# Patient Record
Sex: Male | Born: 1945 | Race: White | Hispanic: No | Marital: Married | Smoking: Former smoker
Health system: Southern US, Community
[De-identification: ages and names within clinical notes are randomized; demographics above are authoritative.]

## PROBLEM LIST (undated history)

## (undated) DIAGNOSIS — M1990 Unspecified osteoarthritis, unspecified site: Secondary | ICD-10-CM

## (undated) DIAGNOSIS — Z87442 Personal history of urinary calculi: Secondary | ICD-10-CM

## (undated) DIAGNOSIS — E119 Type 2 diabetes mellitus without complications: Secondary | ICD-10-CM

## (undated) DIAGNOSIS — I513 Intracardiac thrombosis, not elsewhere classified: Secondary | ICD-10-CM

## (undated) DIAGNOSIS — I219 Acute myocardial infarction, unspecified: Secondary | ICD-10-CM

## (undated) DIAGNOSIS — I10 Essential (primary) hypertension: Secondary | ICD-10-CM

## (undated) DIAGNOSIS — I5022 Chronic systolic (congestive) heart failure: Secondary | ICD-10-CM

## (undated) DIAGNOSIS — I2510 Atherosclerotic heart disease of native coronary artery without angina pectoris: Secondary | ICD-10-CM

## (undated) DIAGNOSIS — Z95810 Presence of automatic (implantable) cardiac defibrillator: Secondary | ICD-10-CM

## (undated) DIAGNOSIS — R7303 Prediabetes: Secondary | ICD-10-CM

## (undated) DIAGNOSIS — E785 Hyperlipidemia, unspecified: Secondary | ICD-10-CM

## (undated) DIAGNOSIS — I255 Ischemic cardiomyopathy: Secondary | ICD-10-CM

## (undated) HISTORY — DX: Ischemic cardiomyopathy: I25.5

## (undated) HISTORY — DX: Essential (primary) hypertension: I10

## (undated) HISTORY — DX: Acute myocardial infarction, unspecified: I21.9

## (undated) HISTORY — DX: Hyperlipidemia, unspecified: E78.5

## (undated) HISTORY — DX: Intracardiac thrombosis, not elsewhere classified: I51.3

---

## 1996-06-10 DIAGNOSIS — I219 Acute myocardial infarction, unspecified: Secondary | ICD-10-CM

## 1996-06-10 HISTORY — PX: CORONARY ANGIOPLASTY: SHX604

## 1996-06-10 HISTORY — DX: Acute myocardial infarction, unspecified: I21.9

## 2002-06-10 HISTORY — PX: HERNIA REPAIR: SHX51

## 2002-07-06 ENCOUNTER — Encounter: Payer: Self-pay | Admitting: Cardiovascular Disease

## 2002-07-06 ENCOUNTER — Ambulatory Visit (HOSPITAL_COMMUNITY): Admission: RE | Admit: 2002-07-06 | Discharge: 2002-07-06 | Payer: Self-pay | Admitting: Cardiovascular Disease

## 2008-04-11 ENCOUNTER — Encounter: Payer: Self-pay | Admitting: Urology

## 2008-04-11 ENCOUNTER — Other Ambulatory Visit: Payer: Self-pay | Admitting: Urology

## 2008-04-12 ENCOUNTER — Observation Stay (HOSPITAL_COMMUNITY): Admission: AD | Admit: 2008-04-12 | Discharge: 2008-04-13 | Payer: Self-pay | Admitting: Urology

## 2010-10-23 NOTE — Op Note (Signed)
Kevin Patel, Kevin Patel                 ACCOUNT NO.:  [id number]   MEDICAL RECORD NO.:  [id number]          PATIENT TYPE:  AMB   LOCATION:  [facility]                         FACILITY:  [facility]   PHYSICIAN:  Bertram Millard. Dahlstedt, M.D.DATE OF BIRTH:  03-11-1946   DATE OF PROCEDURE:  04/11/2008  DATE OF DISCHARGE:                               OPERATIVE REPORT   PREOPERATIVE DIAGNOSIS:  Benign prostatic hypertrophy with significant  lower urinary tract symptoms, bladder calculus.   POSTOPERATIVE DIAGNOSIS:  Benign prostatic hypertrophy with significant  lower urinary tract symptoms, bladder calculus.   PROCEDURE:  Cystoscopy, holmium laser lithotripsy of bladder stone,  Gyrus transurethral resection of the prostate.   SURGEON:  Bertram Millard. Dahlstedt, M.D.   ANESTHESIA:  General with LMA.   COMPLICATIONS:  None.   BRIEF HISTORY:  This 65 year old male has significant lower urinary  tract symptoms and a bladder calculus.  He has been seen before with  recent history of dysuria and pyuria.  Additionally, he has significant  lower urinary tract symptoms.  He is on Flomax with persistent symptoms.  We have talked about further nonsurgical treatment, getting rid of the  stone at the same time, versus TURP with cystolithalopaxy.  The patient  desires to try to come off the medications.  He is aware of risks and  complications of the TURP and stone treatment - bleeding, infection,  among others.  He desires to proceed.   DESCRIPTION OF PROCEDURE:  The patient was identified in the holding  area.  He received preoperative IV antibiotics.  He was taken to the  operating room where general anesthetic was administered using LMA.  He  was placed in the dorsal lithotomy position.  Genitalia and perineum  were prepped and draped.  Time-out was then called.   A resectoscope sheath, 28-French, was passed with the obturator into the  bladder.  The bladder was drained and the resectoscope element placed  with the Gyrus cutting loop.  The bladder was inspected briefly.  Ureteral orifices were identified as well as a single large 16 mm  calculus.  It was on the right side of the trigone.  The TURP with the  Gyrus loop was then started.  First, the anterior tissue along the 12  o'clock position from the bladder neck down to the apex of the prostate  was resected.  Following this, the left lobe of the prostate was  resected down to the surgical capsule starting at the 1 o'clock  position, moving down to the 5 o'clock position.  There was very little  median lobe.  The entire right lobe was resected down to the surgical  capsule, including the apical tissue.  A similar procedure was done for  the right prostatic lobe.  Again, resection was taken down to the  surgical capsule.  Apical tissue was neatly trimmed.  Small bleeders  were carefully electrocoagulated circumferentially around the prostate.  The prostatic chips were then irrigated from the bladder.  Once all of  the chips were irrigated, inspection of the prostatic fossa revealed it  to be wide open  and hemostatic.  I then placed a different bridge  through the resectoscope, and passed a holmium laser fiber.  The stone  was fragmented into probably half a dozen pieces.  It was then  evacuated.  Remaining  inspection of the bladder showed it to be normal.  No bleeding was seen  within the prostatic fossa.  At this time a 22-French Foley catheter was  placed.  It was hooked to dependent drainage.   The procedure was terminated.  The patient was awakened and taken to  PACU in stable condition.      Bertram Millard. Dahlstedt, M.D.  Electronically Signed     SMD/MEDQ  D:  04/25/2008  T:  04/26/2008  Job:  [id number]

## 2011-02-21 ENCOUNTER — Ambulatory Visit (HOSPITAL_COMMUNITY)
Admission: RE | Admit: 2011-02-21 | Discharge: 2011-02-21 | Disposition: A | Discharge status: Home / Self Care | Payer: PRIVATE HEALTH INSURANCE | Source: Ambulatory Visit | Attending: Cardiovascular Disease | Admitting: Cardiovascular Disease

## 2011-02-21 DIAGNOSIS — R079 Chest pain, unspecified: Secondary | ICD-10-CM | POA: Insufficient documentation

## 2011-03-12 LAB — POCT I-STAT 4, (NA,K, GLUC, HGB,HCT)
Glucose, Bld: 96
HCT: 43
Hemoglobin: 14.6
Potassium: 4
Sodium: 140

## 2013-02-09 ENCOUNTER — Encounter: Payer: Self-pay | Admitting: *Deleted

## 2013-02-12 ENCOUNTER — Ambulatory Visit: Payer: PRIVATE HEALTH INSURANCE | Admitting: Cardiovascular Disease

## 2013-02-12 ENCOUNTER — Encounter: Payer: Self-pay | Admitting: Cardiovascular Disease

## 2013-02-12 ENCOUNTER — Ambulatory Visit (INDEPENDENT_AMBULATORY_CARE_PROVIDER_SITE_OTHER): Payer: Medicare Other | Admitting: Cardiovascular Disease

## 2013-02-12 VITALS — BP 120/78 | HR 53 | Ht 76.0 in | Wt 265.7 lb

## 2013-02-12 DIAGNOSIS — I255 Ischemic cardiomyopathy: Secondary | ICD-10-CM | POA: Insufficient documentation

## 2013-02-12 DIAGNOSIS — E785 Hyperlipidemia, unspecified: Secondary | ICD-10-CM

## 2013-02-12 DIAGNOSIS — I10 Essential (primary) hypertension: Secondary | ICD-10-CM | POA: Insufficient documentation

## 2013-02-12 DIAGNOSIS — I2589 Other forms of chronic ischemic heart disease: Secondary | ICD-10-CM

## 2013-02-12 MED ORDER — LISINOPRIL-HYDROCHLOROTHIAZIDE 20-12.5 MG PO TABS: 1.0000 | ORAL_TABLET | Freq: Two times a day (BID) | ORAL | Status: DC

## 2013-02-12 MED ORDER — ATORVASTATIN CALCIUM 80 MG PO TABS: 80.0000 mg | ORAL_TABLET | Freq: Every day | ORAL | Status: DC

## 2013-02-12 MED ORDER — AMLODIPINE BESYLATE 5 MG PO TABS: 5.0000 mg | ORAL_TABLET | Freq: Every day | ORAL | Status: DC

## 2013-02-12 MED ORDER — METOPROLOL TARTRATE 50 MG PO TABS: 25.0000 mg | ORAL_TABLET | Freq: Two times a day (BID) | ORAL | Status: DC

## 2013-02-12 NOTE — Assessment & Plan Note (Signed)
Status post acute myocardial infarction back in 1998 treated with PCI and stenting by Dr. Susa Griffins. His EF has been in the 35-45% range. He has had a mural thrombus the past which has been shown to have resolved by contrast echocardiography. His Coumadin at that regulation was discontinued after that. He denies chest pain or shortness of breath.

## 2013-02-12 NOTE — Assessment & Plan Note (Signed)
Well-controlled on current medications 

## 2013-02-12 NOTE — Assessment & Plan Note (Signed)
On statin therapy. His last lipid profile one year ago revealed total cholesterol 111, LDL of 54 and HDL of 45. We will recheck a lipid and liver profile.

## 2013-02-12 NOTE — Patient Instructions (Addendum)
Your physician recommends that you return for lab work in: the next week or so.  Lipid & Liver Panel You will need to be fasting for this blood work.  Your physician wants you to follow-up in: 1 year. You will receive a reminder letter in the mail two months in advance. If you don't receive a letter, please call our office to schedule the follow-up appointment.

## 2013-02-12 NOTE — Progress Notes (Signed)
02/12/2013 Kevin Patel [facility]   July 21, 1945  [id number]  Primary Physician Feliciana Rossetti, MD Primary Cardiologist: jb   HPI:  The patient is as very pleasant 67 year old, moderately overweight, married Caucasian male, father of 4 and grandfather to 8 grandchildren, whom I saw a year ago. He has a history of ischemic cardiomyopathy, status post myocardial infarction in 1998 treated with PCI stenting by Dr. Susa Griffins. His EF was in the 35% to 45% range with a question of mural thrombus in the past for which he has been on Coumadin anticoagulation. A followup echo performed in September of last year using Definity contrast showed an EF of 35% to 40% with no evidence of mural thrombus and, therefore, Coumadin was discontinued. His other problems include hypertension and hyperlipidemia. Denies chest pain or shortness of breath. His last lipid profile, performed a year ago, was excellent: Total cholesterol 112, LDL of 53, and HDL of 50.since I saw him a year ago he has been completely asymptomatic.     Current Outpatient Prescriptions  Medication Sig Dispense Refill  . amLODipine (NORVASC) 5 MG tablet Take 5 mg by mouth daily.       Marland Kitchen aspirin 81 MG tablet Take 81 mg by mouth daily.      Marland Kitchen atorvastatin (LIPITOR) 80 MG tablet Take 80 mg by mouth daily.      . Flaxseed, Linseed, (FLAXSEED OIL PO) Take by mouth. 2-3x/week      . lisinopril-hydrochlorothiazide (PRINZIDE,ZESTORETIC) 20-12.5 MG per tablet Take 1 tablet by mouth 2 (two) times daily.      . metoprolol (LOPRESSOR) 50 MG tablet Take 25 mg by mouth 2 (two) times daily. 1/2 tablet      . Multiple Vitamin (MULTIVITAMIN) capsule Take 1 capsule by mouth daily.      . vitamin C (ASCORBIC ACID) 500 MG tablet Take 500 mg by mouth daily.      . vitamin E 400 UNIT capsule Take 400 Units by mouth daily.       No current facility-administered medications for this visit.    No Known Allergies  History   Social History  . Marital Status: Married     Spouse Name: N/A    Number of Children: N/A  . Years of Education: N/A   Occupational History  . Not on file.   Social History Main Topics  . Smoking status: Former Smoker    Quit date: 02/12/1993  . Smokeless tobacco: Never Used  . Alcohol Use: No  . Drug Use: No  . Sexual Activity: Not on file   Other Topics Concern  . Not on file   Social History Narrative  . No narrative on file     Review of Systems: General: negative for chills, fever, night sweats or weight changes.  Cardiovascular: negative for chest pain, dyspnea on exertion, edema, orthopnea, palpitations, paroxysmal nocturnal dyspnea or shortness of breath Dermatological: negative for rash Respiratory: negative for cough or wheezing Urologic: negative for hematuria Abdominal: negative for nausea, vomiting, diarrhea, bright red blood per rectum, melena, or hematemesis Neurologic: negative for visual changes, syncope, or dizziness All other systems reviewed and are otherwise negative except as noted above.    Blood pressure 120/78, pulse 53, height 6\' 4"  (1.93 m), weight 265 lb 11.2 oz (120.521 kg).  General appearance: alert and no distress Neck: no adenopathy, no carotid bruit, no JVD, supple, symmetrical, trachea midline and thyroid not enlarged, symmetric, no tenderness/mass/nodules Lungs: clear to auscultation bilaterally Heart: regular rate and rhythm,  S1, S2 normal, no murmur, click, rub or gallop Extremities: extremities normal, atraumatic, no cyanosis or edema  EKG sinus bradycardia at 53 without ST or T wave changes. There were changes of left jugular hypertrophy noted.  ASSESSMENT AND PLAN:   Cardiomyopathy, ischemic Status post acute myocardial infarction back in 1998 treated with PCI and stenting by Dr. Susa Griffins. His EF has been in the 35-45% range. He has had a mural thrombus the past which has been shown to have resolved by contrast echocardiography. His Coumadin at that regulation  was discontinued after that. He denies chest pain or shortness of breath.  Essential hypertension Well-controlled on current medications  Hyperlipidemia On statin therapy. His last lipid profile one year ago revealed total cholesterol 111, LDL of 54 and HDL of 45. We will recheck a lipid and liver profile.      Runell Gess MD FACP,FACC,FAHA, [facility] 02/12/2013 10:12 AM

## 2013-02-19 LAB — HEPATIC FUNCTION PANEL
Alkaline Phosphatase: 54 U/L (ref 39–117)
Bilirubin, Direct: 0.2 mg/dL (ref 0.0–0.3)
Indirect Bilirubin: 0.7 mg/dL (ref 0.0–0.9)
Total Protein: 6.7 g/dL (ref 6.0–8.3)

## 2013-02-19 LAB — LIPID PANEL
LDL Cholesterol: 58 mg/dL (ref 0–99)
Triglycerides: 75 mg/dL (ref <150)
VLDL: 15 mg/dL (ref 0–40)

## 2013-03-08 ENCOUNTER — Encounter: Payer: Self-pay | Admitting: *Deleted

## 2014-02-11 ENCOUNTER — Encounter: Payer: Self-pay | Admitting: Cardiovascular Disease

## 2014-02-11 ENCOUNTER — Ambulatory Visit (INDEPENDENT_AMBULATORY_CARE_PROVIDER_SITE_OTHER): Payer: Medicare Other | Admitting: Cardiovascular Disease

## 2014-02-11 VITALS — BP 150/82 | HR 54 | Ht 76.0 in | Wt 270.0 lb

## 2014-02-11 DIAGNOSIS — Z79899 Other long term (current) drug therapy: Secondary | ICD-10-CM

## 2014-02-11 DIAGNOSIS — E782 Mixed hyperlipidemia: Secondary | ICD-10-CM

## 2014-02-11 DIAGNOSIS — I2589 Other forms of chronic ischemic heart disease: Secondary | ICD-10-CM

## 2014-02-11 DIAGNOSIS — I10 Essential (primary) hypertension: Secondary | ICD-10-CM

## 2014-02-11 DIAGNOSIS — I255 Ischemic cardiomyopathy: Secondary | ICD-10-CM

## 2014-02-11 MED ORDER — AMLODIPINE BESYLATE 5 MG PO TABS
5.0000 mg | ORAL_TABLET | Freq: Every day | ORAL | Status: DC
Start: 2014-02-11 — End: 2015-03-13

## 2014-02-11 MED ORDER — LISINOPRIL-HYDROCHLOROTHIAZIDE 20-12.5 MG PO TABS: 1.0000 | ORAL_TABLET | Freq: Two times a day (BID) | ORAL | Status: DC

## 2014-02-11 MED ORDER — ATORVASTATIN CALCIUM 80 MG PO TABS: 80.0000 mg | ORAL_TABLET | Freq: Every day | ORAL | Status: DC

## 2014-02-11 MED ORDER — METOPROLOL TARTRATE 50 MG PO TABS: 25.0000 mg | ORAL_TABLET | Freq: Two times a day (BID) | ORAL | Status: DC

## 2014-02-11 NOTE — Assessment & Plan Note (Signed)
History of CAD status post myocardial infarction in 1998 with PCI and stenting by Dr. Alanda Amass. His ejection fraction has been in the 35-45% range. There was a question of a typical mural thrombus for which he was on Coumadin anticoagulation. 2-D echo however performed recently with definity  contrast did not show any thrombus and therefore Coumadin was discontinued. He denies chest pain or shortness of breath.

## 2014-02-11 NOTE — Assessment & Plan Note (Signed)
On statin therapy. It's been 2 years since his last lipid profile which was excellent. We will recheck a lipid and liver profile

## 2014-02-11 NOTE — Progress Notes (Signed)
02/11/2014 Kevin Patel   1946/02/06  [id number]  Primary Physician Feliciana Rossetti, MD Primary Cardiologist: Kevin Gess MD Kevin Patel   HPI:  The patient is as very pleasant 68 year old, moderately overweight, married Caucasian male, father of 4 and grandfather to 8 grandchildren, whom I saw a year ago. He has a history of ischemic cardiomyopathy, status post myocardial infarction in 1998 treated with PCI stenting by Dr. Susa Griffins. His EF was in the 35% to 45% range with a question of mural thrombus in the past for which he has been on Coumadin anticoagulation. A followup echo performed in September of last year using Definity contrast showed an EF of 35% to 40% with no evidence of mural thrombus and, therefore, Coumadin was discontinued. His other problems include hypertension and hyperlipidemia. Denies chest pain or shortness of breath. His last lipid profile, performed 2 years ago, was excellent: Total cholesterol 112, LDL of 53, and HDL of 50.since I saw him a year ago he has been completely asymptomatic.     Current Outpatient Prescriptions  Medication Sig Dispense Refill  . amLODipine (NORVASC) 5 MG tablet Take 1 tablet (5 mg total) by mouth daily.  90 tablet  3  . aspirin 81 MG tablet Take 81 mg by mouth daily.      Marland Kitchen atorvastatin (LIPITOR) 80 MG tablet Take 1 tablet (80 mg total) by mouth daily.  90 tablet  3  . Flaxseed, Linseed, (FLAXSEED OIL PO) Take by mouth. 2-3x/week      . lisinopril-hydrochlorothiazide (PRINZIDE,ZESTORETIC) 20-12.5 MG per tablet Take 1 tablet by mouth 2 (two) times daily.  180 tablet  3  . meloxicam (MOBIC) 15 MG tablet Take 15 mg by mouth daily.       . metoprolol (LOPRESSOR) 50 MG tablet Take 0.5 tablets (25 mg total) by mouth 2 (two) times daily. 1/2 tablet  90 tablet  3  . Multiple Vitamin (MULTIVITAMIN) capsule Take 1 capsule by mouth daily.      . vitamin C (ASCORBIC ACID) 500 MG tablet Take 500 mg by mouth daily.      .  vitamin E 400 UNIT capsule Take 400 Units by mouth daily.       No current facility-administered medications for this visit.    No Known Allergies  History   Social History  . Marital Status: Married    Spouse Name: N/A    Number of Children: N/A  . Years of Education: N/A   Occupational History  . Not on file.   Social History Main Topics  . Smoking status: Former Smoker    Quit date: 02/12/1993  . Smokeless tobacco: Never Used  . Alcohol Use: No  . Drug Use: No  . Sexual Activity: Not on file   Other Topics Concern  . Not on file   Social History Narrative  . No narrative on file     Review of Systems: General: negative for chills, fever, night sweats or weight changes.  Cardiovascular: negative for chest pain, dyspnea on exertion, edema, orthopnea, palpitations, paroxysmal nocturnal dyspnea or shortness of breath Dermatological: negative for rash Respiratory: negative for cough or wheezing Urologic: negative for hematuria Abdominal: negative for nausea, vomiting, diarrhea, bright red blood per rectum, melena, or hematemesis Neurologic: negative for visual changes, syncope, or dizziness All other systems reviewed and are otherwise negative except as noted above.    Blood pressure 150/82, pulse 54, height 6\' 4"  (1.93 m), weight 270 lb (122.471 kg).  General appearance: alert and no distress Neck: no adenopathy, no carotid bruit, no JVD, supple, symmetrical, trachea midline and thyroid not enlarged, symmetric, no tenderness/mass/nodules Lungs: clear to auscultation bilaterally Heart: regular rate and rhythm, S1, S2 normal, no murmur, click, rub or gallop Extremities: extremities normal, atraumatic, no cyanosis or edema  EKG sinus bradycardia at 54 with septal Q waves  ASSESSMENT AND PLAN:   Cardiomyopathy, ischemic History of CAD status post myocardial infarction in 1998 with PCI and stenting by Dr. Alanda Amass. His ejection fraction has been in the 35-45%  range. There was a question of a typical mural thrombus for which he was on Coumadin anticoagulation. 2-D echo however performed recently with definity  contrast did not show any thrombus and therefore Coumadin was discontinued. He denies chest pain or shortness of breath.  Essential hypertension Controlled on current medications  Hyperlipidemia On statin therapy. It's been 2 years since his last lipid profile which was excellent. We will recheck a lipid and liver profile      Kevin Gess MD [facility], [facility] 02/11/2014 10:37 AM

## 2014-02-11 NOTE — Assessment & Plan Note (Signed)
Controlled on current medications 

## 2014-02-11 NOTE — Patient Instructions (Signed)
  We will see you back in follow up in 1 year with Dr Berry.   Dr Berry has ordered: Your physician recommends that you return for a FASTING lipid profile    

## 2014-02-18 ENCOUNTER — Other Ambulatory Visit: Payer: Self-pay | Admitting: Cardiovascular Disease

## 2014-02-19 LAB — LIPID PANEL WITH LDL/HDL RATIO
CHOLESTEROL TOTAL: 133 mg/dL (ref 100–199)
HDL: 52 mg/dL (ref >39)
LDL Calculated: 66 mg/dL (ref 0–99)
LDl/HDL Ratio: 1.3 ratio units (ref 0.0–3.6)
TRIGLYCERIDES: 74 mg/dL (ref 0–149)
VLDL CHOLESTEROL CAL: 15 mg/dL (ref 5–40)

## 2014-02-19 LAB — HEPATIC FUNCTION PANEL
ALK PHOS: 62 IU/L (ref 39–117)
ALT: 19 IU/L (ref 0–44)
AST: 20 IU/L (ref 0–40)
Albumin: 4.3 g/dL (ref 3.6–4.8)
Bilirubin, Direct: 0.17 mg/dL (ref 0.00–0.40)
Total Bilirubin: 0.7 mg/dL (ref 0.0–1.2)
Total Protein: 6.3 g/dL (ref 6.0–8.5)

## 2014-02-21 ENCOUNTER — Encounter: Payer: Self-pay | Admitting: *Deleted

## 2015-03-03 ENCOUNTER — Encounter: Payer: Self-pay | Admitting: Cardiovascular Disease

## 2015-03-03 ENCOUNTER — Ambulatory Visit (INDEPENDENT_AMBULATORY_CARE_PROVIDER_SITE_OTHER): Payer: Medicare Other | Admitting: Cardiovascular Disease

## 2015-03-03 ENCOUNTER — Other Ambulatory Visit: Payer: Self-pay

## 2015-03-03 VITALS — BP 115/64 | HR 52 | Ht 76.0 in | Wt 274.0 lb

## 2015-03-03 DIAGNOSIS — I255 Ischemic cardiomyopathy: Secondary | ICD-10-CM | POA: Diagnosis not present

## 2015-03-03 DIAGNOSIS — E785 Hyperlipidemia, unspecified: Secondary | ICD-10-CM

## 2015-03-03 DIAGNOSIS — I10 Essential (primary) hypertension: Secondary | ICD-10-CM | POA: Diagnosis not present

## 2015-03-03 NOTE — Assessment & Plan Note (Signed)
History of hyperlipidemia or atorvastatin 80 mg a day. We will recheck a lipid and liver profile

## 2015-03-03 NOTE — Assessment & Plan Note (Signed)
history of hypertension blood pressure measured at 115/64. He is on amlodipine, lisinopril, hydrochlorothiazide and metoprolol. Continue current meds at current dosing

## 2015-03-03 NOTE — Patient Instructions (Signed)
Medication Instructions:  Your physician recommends that you continue on your current medications as directed. Please refer to the Current Medication list given to you today.   Labwork: Your physician recommends that you return for lab work in: FASTING Lipid and Liver The lab can be found on the FIRST FLOOR of out building in [address]  Testing/Procedures: NONE  Follow-Up: Your physician wants you to follow-up in: 12 months with Dr. Allyson Sabal. You will receive a reminder letter in the mail two months in advance. If you don't receive a letter, please call our office to schedule the follow-up appointment.   Any Other Special Instructions Will Be Listed Below (If Applicable).

## 2015-03-03 NOTE — Progress Notes (Signed)
03/03/2015 Kevin Patel   23-Feb-1946  [id number]  Primary Physician Feliciana Rossetti, MD Primary Cardiologist: Kevin Gess MD Kevin Patel   HPI:  The patient is as very pleasant 69 year old, moderately overweight, married Caucasian male, father of 4 and grandfather to 8 grandchildren, whom I saw a year ago. He has a history of ischemic cardiomyopathy, status post myocardial infarction in 1998 treated with PCI stenting by Dr. Susa Griffins. His EF was in the 35% to 45% range with a question of mural thrombus in the past for which he has been on Coumadin anticoagulation. A followup echo performed in September of last year using Definity contrast showed an EF of 35% to 40% with no evidence of mural thrombus and, therefore, Coumadin was discontinued. His other problems include hypertension and hyperlipidemia.  His last lipid profile performed 02/18/14 revealed an LDL 66 and HDL 52. Since I last saw him, he remained clinically stable .   Current Outpatient Prescriptions  Medication Sig Dispense Refill  . amLODipine (NORVASC) 5 MG tablet Take 1 tablet (5 mg total) by mouth daily. 90 tablet 3  . aspirin 81 MG tablet Take 81 mg by mouth daily.    Marland Kitchen atorvastatin (LIPITOR) 80 MG tablet Take 1 tablet (80 mg total) by mouth daily. 90 tablet 3  . ciprofloxacin (CIPRO) 500 MG tablet Take 500 mg by mouth 2 (two) times daily.     . Flaxseed, Linseed, (FLAXSEED OIL PO) Take by mouth. 2-3x/week    . lisinopril-hydrochlorothiazide (PRINZIDE,ZESTORETIC) 20-12.5 MG per tablet Take 1 tablet by mouth 2 (two) times daily. 180 tablet 3  . meloxicam (MOBIC) 15 MG tablet Take 15 mg by mouth daily.     . metoprolol (LOPRESSOR) 50 MG tablet Take 0.5 tablets (25 mg total) by mouth 2 (two) times daily. 1/2 tablet 90 tablet 3  . Multiple Vitamin (MULTIVITAMIN) capsule Take 1 capsule by mouth daily.    . vitamin C (ASCORBIC ACID) 500 MG tablet Take 500 mg by mouth daily.    . vitamin E 400 UNIT capsule  Take 400 Units by mouth daily.     No current facility-administered medications for this visit.    No Known Allergies  Social History   Social History  . Marital Status: Married    Spouse Name: N/A  . Number of Children: N/A  . Years of Education: N/A   Occupational History  . Not on file.   Social History Main Topics  . Smoking status: Former Smoker    Quit date: 02/12/1993  . Smokeless tobacco: Never Used  . Alcohol Use: No  . Drug Use: No  . Sexual Activity: Not on file   Other Topics Concern  . Not on file   Social History Narrative     Review of Systems: General: negative for chills, fever, night sweats or weight changes.  Cardiovascular: negative for chest pain, dyspnea on exertion, edema, orthopnea, palpitations, paroxysmal nocturnal dyspnea or shortness of breath Dermatological: negative for rash Respiratory: negative for cough or wheezing Urologic: negative for hematuria Abdominal: negative for nausea, vomiting, diarrhea, bright red blood per rectum, melena, or hematemesis Neurologic: negative for visual changes, syncope, or dizziness All other systems reviewed and are otherwise negative except as noted above.    Blood pressure 115/64, pulse 52, height  (1.93 m), weight 274 lb (124.286 kg).  General appearance: alert and no distress Neck: no adenopathy, no carotid bruit, no JVD, supple, symmetrical, trachea midline and thyroid not enlarged, symmetric,  no tenderness/mass/nodules Lungs: clear to auscultation bilaterally Heart: regular rate and rhythm, S1, S2 normal, no murmur, click, rub or gallop Extremities: extremities normal, atraumatic, no cyanosis or edema  EKG sinus bradycardia 52 with septal Q waves. I personally reviewed this EKG  ASSESSMENT AND PLAN:   Hyperlipidemia History of hyperlipidemia or atorvastatin 80 mg a day. We will recheck a lipid and liver profile  Essential hypertension history of hypertension blood pressure measured at  115/64. He is on amlodipine, lisinopril, hydrochlorothiazide and metoprolol. Continue current meds at current dosing  Cardiomyopathy, ischemic History of ischemic coronary myopathy status post acute myocardial infarction in 1998 treated with PCI stenting by Dr. Alanda Amass. His EF at that time was in the 35-45% range and echo performed September 2014 showed an EF that was stable. He denies chest pain or shortness of breath.      Kevin Gess MD FACP,FACC,FAHA, [facility] 03/03/2015 10:01 AM

## 2015-03-03 NOTE — Assessment & Plan Note (Signed)
History of ischemic coronary myopathy status post acute myocardial infarction in 1998 treated with PCI stenting by Dr. Alanda Amass. His EF at that time was in the 35-45% range and echo performed September 2014 showed an EF that was stable. He denies chest pain or shortness of breath.

## 2015-03-10 ENCOUNTER — Encounter: Payer: Self-pay | Admitting: Cardiovascular Disease

## 2015-03-10 ENCOUNTER — Other Ambulatory Visit: Payer: Self-pay | Admitting: Cardiovascular Disease

## 2015-03-11 LAB — HEPATIC FUNCTION PANEL
ALBUMIN: 4.4 g/dL (ref 3.6–4.8)
ALT: 19 IU/L (ref 0–44)
AST: 18 IU/L (ref 0–40)
Alkaline Phosphatase: 62 IU/L (ref 39–117)
BILIRUBIN TOTAL: 0.8 mg/dL (ref 0.0–1.2)
BILIRUBIN, DIRECT: 0.21 mg/dL (ref 0.00–0.40)
TOTAL PROTEIN: 6.4 g/dL (ref 6.0–8.5)

## 2015-03-11 LAB — LIPID PANEL W/O CHOL/HDL RATIO
Cholesterol, Total: 121 mg/dL (ref 100–199)
HDL: 51 mg/dL (ref >39)
LDL Calculated: 56 mg/dL (ref 0–99)
Triglycerides: 72 mg/dL (ref 0–149)
VLDL Cholesterol Cal: 14 mg/dL (ref 5–40)

## 2015-03-13 ENCOUNTER — Other Ambulatory Visit: Payer: Self-pay | Admitting: Cardiovascular Disease

## 2015-03-13 NOTE — Telephone Encounter (Signed)
Rx(s) sent to pharmacy electronically.  

## 2015-08-11 DIAGNOSIS — R972 Elevated prostate specific antigen [PSA]: Secondary | ICD-10-CM | POA: Insufficient documentation

## 2015-08-11 DIAGNOSIS — M1710 Unilateral primary osteoarthritis, unspecified knee: Secondary | ICD-10-CM | POA: Insufficient documentation

## 2015-08-11 DIAGNOSIS — M179 Osteoarthritis of knee, unspecified: Secondary | ICD-10-CM | POA: Insufficient documentation

## 2015-08-11 DIAGNOSIS — E785 Hyperlipidemia, unspecified: Secondary | ICD-10-CM | POA: Insufficient documentation

## 2015-08-11 DIAGNOSIS — Z79899 Other long term (current) drug therapy: Secondary | ICD-10-CM | POA: Insufficient documentation

## 2015-08-11 DIAGNOSIS — I2510 Atherosclerotic heart disease of native coronary artery without angina pectoris: Secondary | ICD-10-CM | POA: Insufficient documentation

## 2015-08-11 DIAGNOSIS — M1990 Unspecified osteoarthritis, unspecified site: Secondary | ICD-10-CM | POA: Insufficient documentation

## 2015-08-11 DIAGNOSIS — E1169 Type 2 diabetes mellitus with other specified complication: Secondary | ICD-10-CM | POA: Insufficient documentation

## 2015-08-11 DIAGNOSIS — E6609 Other obesity due to excess calories: Secondary | ICD-10-CM | POA: Insufficient documentation

## 2015-08-11 DIAGNOSIS — Z6831 Body mass index (BMI) 31.0-31.9, adult: Secondary | ICD-10-CM

## 2015-08-11 DIAGNOSIS — E1142 Type 2 diabetes mellitus with diabetic polyneuropathy: Secondary | ICD-10-CM | POA: Insufficient documentation

## 2016-03-06 ENCOUNTER — Encounter: Payer: Self-pay | Admitting: Cardiovascular Disease

## 2016-03-06 ENCOUNTER — Ambulatory Visit (INDEPENDENT_AMBULATORY_CARE_PROVIDER_SITE_OTHER): Payer: Medicare Other | Admitting: Cardiovascular Disease

## 2016-03-06 DIAGNOSIS — I10 Essential (primary) hypertension: Secondary | ICD-10-CM

## 2016-03-06 DIAGNOSIS — I2589 Other forms of chronic ischemic heart disease: Secondary | ICD-10-CM | POA: Diagnosis not present

## 2016-03-06 DIAGNOSIS — E785 Hyperlipidemia, unspecified: Secondary | ICD-10-CM | POA: Diagnosis not present

## 2016-03-06 DIAGNOSIS — I255 Ischemic cardiomyopathy: Secondary | ICD-10-CM | POA: Diagnosis not present

## 2016-03-06 NOTE — Progress Notes (Signed)
03/06/2016 Kevin Patel   04/12/1946  [id number]  Primary Physician Feliciana Rossetti, MD Primary Cardiologist: Runell Gess MD Roseanne Reno  HPI:  The patient is as very pleasant 70 year old, moderately overweight, married Caucasian male, father of 4 and grandfather to 8 grandchildren, whom I saw 03/03/15. He has a history of ischemic cardiomyopathy, status post myocardial infarction in 1998 treated with PCI stenting by Dr. Susa Griffins. His EF was in the 35% to 45% range with a question of mural thrombus in the past for which he has been on Coumadin anticoagulation. A followup echo performed in September of last year using Definity contrast showed an EF of 35% to 40% with no evidence of mural thrombus and, therefore, Coumadin was discontinued. His other problems include hypertension and hyperlipidemia.   his primary care physician follows his lipid profile.. Since I saw him a year ago he's remained currently stable denying chest pain or shortness of breath.   Current Outpatient Prescriptions  Medication Sig Dispense Refill  . amLODipine (NORVASC) 5 MG tablet TAKE ONE TABLET BY MOUTH ONCE DAILY 90 tablet 3  . aspirin 81 MG tablet Take 81 mg by mouth daily.    Marland Kitchen atorvastatin (LIPITOR) 80 MG tablet TAKE ONE TABLET BY MOUTH ONCE DAILY 90 tablet 3  . ciprofloxacin (CIPRO) 500 MG tablet Take 500 mg by mouth 2 (two) times daily.     . Flaxseed, Linseed, (FLAXSEED OIL PO) Take by mouth. 2-3x/week    . lisinopril-hydrochlorothiazide (PRINZIDE,ZESTORETIC) 20-12.5 MG tablet TAKE ONE TABLET BY MOUTH TWICE DAILY 180 tablet 3  . meloxicam (MOBIC) 15 MG tablet Take 15 mg by mouth daily.     . metoprolol (LOPRESSOR) 50 MG tablet TAKE ONE-HALF TABLET BY MOUTH TWICE DAILY 90 tablet 3  . Multiple Vitamin (MULTIVITAMIN) capsule Take 1 capsule by mouth daily.    . vitamin C (ASCORBIC ACID) 500 MG tablet Take 500 mg by mouth daily.    . vitamin E 400 UNIT capsule Take 400 Units by mouth daily.      No current facility-administered medications for this visit.     No Known Allergies  Social History   Social History  . Marital status: Married    Spouse name: N/A  . Number of children: N/A  . Years of education: N/A   Occupational History  . Not on file.   Social History Main Topics  . Smoking status: Former Smoker    Quit date: 02/12/1993  . Smokeless tobacco: Never Used  . Alcohol use No  . Drug use: No  . Sexual activity: Not on file   Other Topics Concern  . Not on file   Social History Narrative  . No narrative on file     Review of Systems: General: negative for chills, fever, night sweats or weight changes.  Cardiovascular: negative for chest pain, dyspnea on exertion, edema, orthopnea, palpitations, paroxysmal nocturnal dyspnea or shortness of breath Dermatological: negative for rash Respiratory: negative for cough or wheezing Urologic: negative for hematuria Abdominal: negative for nausea, vomiting, diarrhea, bright red blood per rectum, melena, or hematemesis Neurologic: negative for visual changes, syncope, or dizziness All other systems reviewed and are otherwise negative except as noted above.    Blood pressure 118/72, pulse (!) 55, height 6' (1.829 m), weight 260 lb (117.9 kg).  General appearance: alert and no distress Neck: no adenopathy, no carotid bruit, no JVD, supple, symmetrical, trachea midline and thyroid not enlarged, symmetric, no tenderness/mass/nodules Lungs: clear  to auscultation bilaterally Heart: regular rate and rhythm, S1, S2 normal, no murmur, click, rub or gallop Extremities: extremities normal, atraumatic, no cyanosis or edema  EKG sinus bradycardia at 55 with poor R-wave progression, and septal Q waves as well as left axis deviation . I personally reviewed this EKG  ASSESSMENT AND PLAN:   Cardiomyopathy, ischemic History of ischemic cardiomyopathy status post myocardial infarction in 1998 she with PCI and stenting by Dr.  Alanda AmassWeintraub. His EF has been in the 35-45% range. There was a question of a mural thrombus in the past for which he was treated with Coumadin however this was later shown by contrast echo not to be present. He remains asymptomatic.  Essential hypertension History of hypertension blood pressure measured today 118/72. He is on amlodipine, lisinopril, hydrochlorothiazide and metoprolol. Continue current meds at current dosing  Hyperlipidemia History of hyperlipidemia on statin therapy followed by his PCP      Runell GessJonathan J. Berry MD [facility] LPFACP,FACC,FAHA, [facility] SystemFSCAI 03/06/2016 12:17 PM

## 2016-03-06 NOTE — Assessment & Plan Note (Signed)
History of hypertension blood pressure measured today 118/72. He is on amlodipine, lisinopril, hydrochlorothiazide and metoprolol. Continue current meds at current dosing

## 2016-03-06 NOTE — Patient Instructions (Signed)
Medication Instructions:  NO CHANGES.   Follow-Up: Your physician wants you to follow-up in: 12 MONTHS WITH DR BERRY.  You will receive a reminder letter in the mail two months in advance. If you don't receive a letter, please call our office to schedule the follow-up appointment.   If you need a refill on your cardiac medications before your next appointment, please call your pharmacy.   

## 2016-03-06 NOTE — Assessment & Plan Note (Signed)
History of ischemic cardiomyopathy status post myocardial infarction in 1998 she with PCI and stenting by Dr. Alanda Amass. His EF has been in the 35-45% range. There was a question of a mural thrombus in the past for which he was treated with Coumadin however this was later shown by contrast echo not to be present. He remains asymptomatic.

## 2016-03-06 NOTE — Assessment & Plan Note (Signed)
History of hyperlipidemia on statin therapy followed by his PCP 

## 2016-03-07 ENCOUNTER — Other Ambulatory Visit: Payer: Self-pay | Admitting: Cardiovascular Disease

## 2016-03-07 NOTE — Telephone Encounter (Signed)
Rx request sent to pharmacy.  

## 2016-09-04 ENCOUNTER — Inpatient Hospital Stay (HOSPITAL_COMMUNITY)
Admission: EM | Admit: 2016-09-04 | Discharge: 2016-09-07 | DRG: 247 | Disposition: A | Discharge status: Home / Self Care | Payer: Medicare Other | Attending: Cardiovascular Disease | Admitting: Cardiovascular Disease

## 2016-09-04 ENCOUNTER — Encounter (HOSPITAL_COMMUNITY): Payer: Self-pay | Admitting: Emergency Medicine

## 2016-09-04 ENCOUNTER — Emergency Department (HOSPITAL_COMMUNITY): Payer: Medicare Other

## 2016-09-04 DIAGNOSIS — I255 Ischemic cardiomyopathy: Secondary | ICD-10-CM | POA: Diagnosis present

## 2016-09-04 DIAGNOSIS — Z791 Long term (current) use of non-steroidal anti-inflammatories (NSAID): Secondary | ICD-10-CM

## 2016-09-04 DIAGNOSIS — N179 Acute kidney failure, unspecified: Secondary | ICD-10-CM | POA: Diagnosis not present

## 2016-09-04 DIAGNOSIS — I493 Ventricular premature depolarization: Secondary | ICD-10-CM | POA: Diagnosis not present

## 2016-09-04 DIAGNOSIS — E785 Hyperlipidemia, unspecified: Secondary | ICD-10-CM | POA: Diagnosis present

## 2016-09-04 DIAGNOSIS — I200 Unstable angina: Secondary | ICD-10-CM | POA: Diagnosis present

## 2016-09-04 DIAGNOSIS — R079 Chest pain, unspecified: Secondary | ICD-10-CM | POA: Diagnosis not present

## 2016-09-04 DIAGNOSIS — I10 Essential (primary) hypertension: Secondary | ICD-10-CM | POA: Diagnosis present

## 2016-09-04 DIAGNOSIS — I214 Non-ST elevation (NSTEMI) myocardial infarction: Principal | ICD-10-CM | POA: Diagnosis present

## 2016-09-04 DIAGNOSIS — Z87891 Personal history of nicotine dependence: Secondary | ICD-10-CM

## 2016-09-04 DIAGNOSIS — Z955 Presence of coronary angioplasty implant and graft: Secondary | ICD-10-CM

## 2016-09-04 DIAGNOSIS — T501X5A Adverse effect of loop [high-ceiling] diuretics, initial encounter: Secondary | ICD-10-CM | POA: Diagnosis not present

## 2016-09-04 DIAGNOSIS — E1165 Type 2 diabetes mellitus with hyperglycemia: Secondary | ICD-10-CM | POA: Diagnosis present

## 2016-09-04 DIAGNOSIS — E119 Type 2 diabetes mellitus without complications: Secondary | ICD-10-CM

## 2016-09-04 DIAGNOSIS — I236 Thrombosis of atrium, auricular appendage, and ventricle as current complications following acute myocardial infarction: Secondary | ICD-10-CM | POA: Diagnosis not present

## 2016-09-04 DIAGNOSIS — Z79899 Other long term (current) drug therapy: Secondary | ICD-10-CM

## 2016-09-04 DIAGNOSIS — I110 Hypertensive heart disease with heart failure: Secondary | ICD-10-CM | POA: Diagnosis present

## 2016-09-04 DIAGNOSIS — Z7982 Long term (current) use of aspirin: Secondary | ICD-10-CM

## 2016-09-04 DIAGNOSIS — I472 Ventricular tachycardia: Secondary | ICD-10-CM | POA: Diagnosis not present

## 2016-09-04 DIAGNOSIS — I2510 Atherosclerotic heart disease of native coronary artery without angina pectoris: Secondary | ICD-10-CM | POA: Diagnosis present

## 2016-09-04 DIAGNOSIS — E876 Hypokalemia: Secondary | ICD-10-CM | POA: Diagnosis not present

## 2016-09-04 DIAGNOSIS — I5022 Chronic systolic (congestive) heart failure: Secondary | ICD-10-CM | POA: Diagnosis present

## 2016-09-04 DIAGNOSIS — I252 Old myocardial infarction: Secondary | ICD-10-CM

## 2016-09-04 HISTORY — DX: Essential (primary) hypertension: I10

## 2016-09-04 HISTORY — DX: Atherosclerotic heart disease of native coronary artery without angina pectoris: I25.10

## 2016-09-04 HISTORY — DX: Personal history of urinary calculi: Z87.442

## 2016-09-04 HISTORY — DX: Type 2 diabetes mellitus without complications: E11.9

## 2016-09-04 LAB — BASIC METABOLIC PANEL
Anion gap: 10 (ref 5–15)
BUN: 16 mg/dL (ref 6–20)
CALCIUM: 9.2 mg/dL (ref 8.9–10.3)
CO2: 27 mmol/L (ref 22–32)
Chloride: 104 mmol/L (ref 101–111)
Creatinine, Ser: 1.18 mg/dL (ref 0.61–1.24)
GFR calc Af Amer: >60 mL/min (ref >60)
GFR calc non Af Amer: >60 mL/min (ref >60)
GLUCOSE: 162 mg/dL — AB (ref 65–99)
Potassium: 3.7 mmol/L (ref 3.5–5.1)
Sodium: 141 mmol/L (ref 135–145)

## 2016-09-04 LAB — CBC
HCT: 39.3 % (ref 39.0–52.0)
Hemoglobin: 13.6 g/dL (ref 13.0–17.0)
MCH: 33.6 pg (ref 26.0–34.0)
MCHC: 34.6 g/dL (ref 30.0–36.0)
MCV: 97 fL (ref 78.0–100.0)
PLATELETS: 188 10*3/uL (ref 150–400)
RBC: 4.05 MIL/uL — ABNORMAL LOW (ref 4.22–5.81)
RDW: 12.3 % (ref 11.5–15.5)
WBC: 12.4 10*3/uL — ABNORMAL HIGH (ref 4.0–10.5)

## 2016-09-04 MED ORDER — HEPARIN BOLUS VIA INFUSION
4000.0000 [IU] | Freq: Once | INTRAVENOUS | Status: AC
  Administered 2016-09-04: 4000 [IU] via INTRAVENOUS
  Filled 2016-09-04: qty 4000

## 2016-09-04 MED ORDER — HEPARIN (PORCINE) IN NACL 100-0.45 UNIT/ML-% IJ SOLN: 12.0000 [IU]/kg/h | INTRAMUSCULAR | Status: DC

## 2016-09-04 MED ORDER — HEPARIN (PORCINE) IN NACL 100-0.45 UNIT/ML-% IJ SOLN
1400.0000 [IU]/h | INTRAMUSCULAR | Status: DC
  Administered 2016-09-04: 1400 [IU]/h via INTRAVENOUS
  Filled 2016-09-04: qty 250

## 2016-09-04 NOTE — ED Notes (Signed)
istat results shown to edp and Charity fundraiser.

## 2016-09-04 NOTE — ED Provider Notes (Addendum)
[facility]-EMERGENCY DEPT Provider Note   CSN: [id number] Arrival date & time: 09/04/16  2148     History   Chief Complaint Chief Complaint  Patient presents with  . Chest Pain    HPI Kevin Patel is a 71 y.o. male with a PMH of an MI with stent placement in 1998, ischemic cardiomyopathy (EF 35-45%), HTN, HLD, and DM who presents to the ED complaining of chest pain. The patient reports that the pain began at 4:00 pm this afternoon. He describes it as a substernal pressure that is constant in nature. Initially, there was an associated stabbing pain radiating towards his back. Radiation has since resolved. The pain is aggravated with movement. It was relieved with 2 nitroglycerin in the ambulance. Of note, the patient became hypotensive and nauseous with administration of NTG. Received fluids en route with resolution of hypotension and nausea on arrival to ED. The patient also reports taking three 300 mg ASA without relief. Pain is a 6/10. Denies fever, NS, chills, recent illness, diaphoresis, fatigue, headache, DOE, SOB, abdominal pain, N/V, peripheral edema, changes in BM, melena, hematochezia, and urinary symptoms.   HPI  Past Medical History:  Diagnosis Date  . HTN (hypertension)   . Hyperlipidemia   . Ischemic cardiomyopathy   . MI (myocardial infarction) 1998   myoview 09/25/2006-no ischemia, EF 46%  . Mural thrombus of heart    on coumadin until follow up echo with contrast in Sept 13, 2012 which showed no thrombus and coumadin was d/c    Patient Active Problem List   Diagnosis Date Noted  . Cardiomyopathy, ischemic 02/12/2013  . Essential hypertension 02/12/2013  . Hyperlipidemia 02/12/2013    Past Surgical History:  Procedure Laterality Date  . CORONARY ANGIOPLASTY  1998   PCI       Home Medications    Prior to Admission medications   Medication Sig Start Date End Date Taking? Authorizing Provider  amLODipine (NORVASC) 5 MG tablet TAKE ONE TABLET BY MOUTH ONCE  DAILY 03/07/16   Runell Gess, MD  aspirin 81 MG tablet Take 81 mg by mouth daily.    Historical Provider, MD  atorvastatin (LIPITOR) 80 MG tablet TAKE ONE TABLET BY MOUTH ONCE DAILY 03/07/16   Runell Gess, MD  ciprofloxacin (CIPRO) 500 MG tablet Take 500 mg by mouth 2 (two) times daily.  02/24/15   Historical Provider, MD  Flaxseed, Linseed, (FLAXSEED OIL PO) Take by mouth. 2-3x/week    Historical Provider, MD  lisinopril-hydrochlorothiazide (PRINZIDE,ZESTORETIC) 20-12.5 MG tablet TAKE ONE TABLET BY MOUTH TWICE DAILY 03/07/16   Runell Gess, MD  meloxicam (MOBIC) 15 MG tablet Take 15 mg by mouth daily.  02/01/14   Historical Provider, MD  metoprolol (LOPRESSOR) 50 MG tablet TAKE ONE-HALF TABLET BY MOUTH TWICE DAILY 03/07/16   Runell Gess, MD  Multiple Vitamin (MULTIVITAMIN) capsule Take 1 capsule by mouth daily.    Historical Provider, MD  vitamin C (ASCORBIC ACID) 500 MG tablet Take 500 mg by mouth daily.    Historical Provider, MD  vitamin E 400 UNIT capsule Take 400 Units by mouth daily.    Historical Provider, MD    Family History Family History  Problem Relation Age of Onset  . Emphysema Father     Social History Social History  Substance Use Topics  . Smoking status: Former Smoker    Quit date: 02/12/1993  . Smokeless tobacco: Never Used  . Alcohol use No     Allergies   Patient  has no known allergies.   Review of Systems Review of Systems   Physical Exam Updated Vital Signs BP 124/87   Pulse 88   Temp 97.7 F (36.5 C) (Oral)   Resp 10   Ht 6\' 4"  (1.93 m)   Wt 117.9 kg   SpO2 99%   BMI 31.65 kg/m   Physical Exam  Constitutional: He appears well-developed and well-nourished. No distress.  Appears uncomfortable   HENT:  Head: Normocephalic and atraumatic.  Eyes: Conjunctivae are normal. No scleral icterus.  Neck: Normal range of motion. Neck supple.  Cardiovascular: Normal rate, regular rhythm and normal heart sounds.   Pulmonary/Chest: Effort  normal and breath sounds normal. No respiratory distress.  Abdominal: Soft. There is no tenderness.  Musculoskeletal: He exhibits no edema.  Neurological: He is alert.  Skin: Skin is warm and dry. He is not diaphoretic.  Psychiatric: His behavior is normal.  Nursing note and vitals reviewed.    ED Treatments / Results  Labs (all labs ordered are listed, but only abnormal results are displayed) Labs Reviewed  BASIC METABOLIC PANEL  CBC  I-STAT TROPOININ, ED    EKG  EKG Interpretation None       Radiology Dg Chest 2 View  Result Date: 09/04/2016 CLINICAL DATA:  Central chest pain beginning earlier today. EXAM: CHEST  2 VIEW COMPARISON:  09/15/2015 FINDINGS: Cardiac silhouette is top-normal in size. There is no aortic aneurysm. No pulmonary consolidations noted. There is left basilar scarring. Rounded density overlying the anterior fourth rib may represent a bone island or calcified granuloma. This is unchanged in appearance. Similar finding in the left upper lobe with more oblong density overlying the posterior left ninth rib which is more likely a bone island or summation of vascular and rib shadows. IMPRESSION: No active cardiopulmonary disease.  Scarring at the left lung base. Electronically Signed   By: Tollie Eth M.D.   On: 09/04/2016 22:23    Procedures .Critical Care Performed by: Arthor Captain Authorized by: Arthor Captain   Critical care provider statement:    Critical care time (minutes):  60   Critical care was necessary to treat or prevent imminent or life-threatening deterioration of the following conditions:  Cardiac failure   Critical care was time spent personally by me on the following activities:  Discussions with consultants, discussions with primary provider, examination of patient, obtaining history from patient or surrogate, interpretation of cardiac output measurements, evaluation of patient's response to treatment, ordering and performing treatments  and interventions, ordering and review of laboratory studies, ordering and review of radiographic studies, pulse oximetry, re-evaluation of patient's condition and review of old charts   (including critical care time)  Medications Ordered in ED Medications - No data to display   Initial Impression / Assessment and Plan / ED Course  I have reviewed the triage vital signs and the nursing notes.  Pertinent labs & imaging results that were available during my care of the patient were reviewed by me and considered in my medical decision making (see chart for details).    Patient with an STEMI. Elevated troponin and chest pain. He did have an episode of hypotension with administration of his nitroglycerin prior to arrival. However, this was resolved easily with 700 mL of normal saline. I doubt that the patient has a dissection and is well-appearing without chest pain. Upon my initial evaluation. I have discussed the case with Dr. Orson Aloe. The cardiac fellow on call, who will admit the patient. Patient is  stable throughout his ED visit. Treated with heparin. Patient was given nitro paste, however, he did not tolerate this well and had a significant response of his blood pressure. Final Cliical Impressions(s) / ED Diagnoses   Final diagnoses:  NSTEMI (non-ST elevated myocardial infarction) [facility])    New Prescriptions New Prescriptions   No medications on file     Arthor Captain, PA-C 09/05/16 0734    Vanetta Mulders, MD 09/06/16 1633    Arthor Captain, PA-C 09/22/16 2129    Vanetta Mulders, MD 09/23/16 1539

## 2016-09-04 NOTE — ED Triage Notes (Signed)
BIB EMS from home, sudden onset of mid substernal CP non radiating approx 7pm. Pt took 3-300ASA at home, received 2NTG with EMS and became hypotensive 76/50. Received 700NS and pressure up 120/78. Pt A/OX4

## 2016-09-04 NOTE — ED Notes (Signed)
i-stat trop not crossing over, 0.12 Abigail PA and [facility] MD aware.

## 2016-09-04 NOTE — Progress Notes (Signed)
ANTICOAGULATION CONSULT NOTE - Initial Consult  Pharmacy Consult for heparin Indication: chest pain/ACS  No Known Allergies  Patient Measurements: Height: 6\' 4"  (193 cm) Weight: 260 lb (117.9 kg) IBW/kg (Calculated) : 86.8 Heparin Dosing Weight: 110kg  Vital Signs: Temp: 97.7 F (36.5 C) (03/28 2157) Temp Source: Oral (03/28 2157) BP: 124/87 (03/28 2230) Pulse Rate: 88 (03/28 2230)  Labs:  Recent Labs  09/04/16 2226  HGB 13.6  HCT 39.3  PLT 188  CREATININE 1.18    Estimated Creatinine Clearance: 81.7 mL/min (by C-G formula based on SCr of 1.18 mg/dL).   Medical History: Past Medical History:  Diagnosis Date  . HTN (hypertension)   . Hyperlipidemia   . Ischemic cardiomyopathy   . MI (myocardial infarction) 1998   myoview 09/25/2006-no ischemia, EF 46%  . Mural thrombus of heart    on coumadin until follow up echo with contrast in Sept 13, 2012 which showed no thrombus and coumadin was d/c     Assessment: 71yo male c/o sudden onset of non-radiating CP, to begin heparin.  Goal of Therapy:  Heparin level 0.3-0.7 units/ml Monitor platelets by anticoagulation protocol: Yes   Plan:  Will give heparin 4000 units IV bolus followed by gtt at 1400 units/hr and monitor heparin levels and CBC.  Vernard Gambles, PharmD, BCPS  09/04/2016,11:07 PM

## 2016-09-04 NOTE — ED Provider Notes (Signed)
Medical screening examination/treatment/procedure(s) were conducted as a shared visit with non-physician practitioner(s) and myself.  I personally evaluated the patient during the encounter.   EKG Interpretation  Date/Time:  Wednesday September 04 2016 22:23:16 EDT Ventricular Rate:  87 PR Interval:    QRS Duration: 106 QT Interval:  401 QTC Calculation: 483 R Axis:   -23 Text Interpretation:  Sinus rhythm Multiform ventricular premature complexes Borderline left axis deviation Repol abnrm suggests ischemia, lateral leads No previous tracing Confirmed by Mizani Dilday  MD, Delara Shepheard [phone]) on 09/04/2016 10:47:58 PM      Results for orders placed or performed during the hospital encounter of 09/04/16  Basic metabolic panel  Result Value Ref Range   Sodium 141 135 - 145 mmol/L   Potassium 3.7 3.5 - 5.1 mmol/L   Chloride 104 101 - 111 mmol/L   CO2 27 22 - 32 mmol/L   Glucose, Bld 162 (H) 65 - 99 mg/dL   BUN 16 6 - 20 mg/dL   Creatinine, Ser 2.84 0.61 - 1.24 mg/dL   Calcium 9.2 8.9 - 13.2 mg/dL   GFR calc non Af Amer >60 >60 mL/min   GFR calc Af Amer >60 >60 mL/min   Anion gap 10 5 - 15  CBC  Result Value Ref Range   WBC 12.4 (H) 4.0 - 10.5 K/uL   RBC 4.05 (L) 4.22 - 5.81 MIL/uL   Hemoglobin 13.6 13.0 - 17.0 g/dL   HCT 44.0 10.2 - 72.5 %   MCV 97.0 78.0 - 100.0 fL   MCH 33.6 26.0 - 34.0 pg   MCHC 34.6 30.0 - 36.0 g/dL   RDW 36.6 44.0 - 34.7 %   Platelets 188 150 - 400 K/uL   Dg Chest 2 View  Result Date: 09/04/2016 CLINICAL DATA:  Central chest pain beginning earlier today. EXAM: CHEST  2 VIEW COMPARISON:  09/15/2015 FINDINGS: Cardiac silhouette is top-normal in size. There is no aortic aneurysm. No pulmonary consolidations noted. There is left basilar scarring. Rounded density overlying the anterior fourth rib may represent a bone island or calcified granuloma. This is unchanged in appearance. Similar finding in the left upper lobe with more oblong density overlying the posterior left  ninth rib which is more likely a bone island or summation of vascular and rib shadows. IMPRESSION: No active cardiopulmonary disease.  Scarring at the left lung base. Electronically Signed   By: Tollie Eth M.D.   On: 09/04/2016 22:23    Patient since 4 this afternoon was substernal chest pain on and off. Never lasting 15 minutes or longer. Chest x-ray has no acute findings troponin is elevated. Patient status post stent in 1998. Patient did take nitroglycerin this evening and Lopid improvement in pain but it did not resolve. This presentation seems to be consistent with unstable angina perhaps a non-STEMI. EKG had some subtle ST elevation in V1. We'll start heparin and discuss with cardiology patients are taken aspirin today.  On physical exam patient is alert and oriented in no acute distress. Lungs are clear bilaterally heart regular rate. Abdomen soft nontender. Patient denied any shortness of breath nausea or vomiting. Did have one episode of nausea and vomiting following his nitroglycerin.   Vanetta Mulders, MD 09/04/16 [phone]

## 2016-09-04 NOTE — ED Notes (Signed)
Pt transported to xray 

## 2016-09-05 ENCOUNTER — Encounter (HOSPITAL_COMMUNITY)
Admission: EM | Disposition: A | Discharge status: Home / Self Care | Payer: Self-pay | Source: Home / Self Care | Attending: Internal Medicine

## 2016-09-05 ENCOUNTER — Encounter (HOSPITAL_COMMUNITY): Payer: Self-pay | Admitting: Cardiovascular Disease

## 2016-09-05 DIAGNOSIS — Z87891 Personal history of nicotine dependence: Secondary | ICD-10-CM | POA: Diagnosis not present

## 2016-09-05 DIAGNOSIS — N179 Acute kidney failure, unspecified: Secondary | ICD-10-CM | POA: Diagnosis not present

## 2016-09-05 DIAGNOSIS — I200 Unstable angina: Secondary | ICD-10-CM | POA: Diagnosis not present

## 2016-09-05 DIAGNOSIS — I2510 Atherosclerotic heart disease of native coronary artery without angina pectoris: Secondary | ICD-10-CM | POA: Diagnosis present

## 2016-09-05 DIAGNOSIS — I208 Other forms of angina pectoris: Secondary | ICD-10-CM | POA: Diagnosis not present

## 2016-09-05 DIAGNOSIS — I10 Essential (primary) hypertension: Secondary | ICD-10-CM | POA: Diagnosis not present

## 2016-09-05 DIAGNOSIS — I25110 Atherosclerotic heart disease of native coronary artery with unstable angina pectoris: Secondary | ICD-10-CM

## 2016-09-05 DIAGNOSIS — I5022 Chronic systolic (congestive) heart failure: Secondary | ICD-10-CM | POA: Diagnosis present

## 2016-09-05 DIAGNOSIS — Z79899 Other long term (current) drug therapy: Secondary | ICD-10-CM | POA: Diagnosis not present

## 2016-09-05 DIAGNOSIS — E785 Hyperlipidemia, unspecified: Secondary | ICD-10-CM | POA: Diagnosis present

## 2016-09-05 DIAGNOSIS — T501X5A Adverse effect of loop [high-ceiling] diuretics, initial encounter: Secondary | ICD-10-CM | POA: Diagnosis not present

## 2016-09-05 DIAGNOSIS — I493 Ventricular premature depolarization: Secondary | ICD-10-CM | POA: Diagnosis not present

## 2016-09-05 DIAGNOSIS — I110 Hypertensive heart disease with heart failure: Secondary | ICD-10-CM | POA: Diagnosis present

## 2016-09-05 DIAGNOSIS — E1165 Type 2 diabetes mellitus with hyperglycemia: Secondary | ICD-10-CM | POA: Diagnosis present

## 2016-09-05 DIAGNOSIS — Z955 Presence of coronary angioplasty implant and graft: Secondary | ICD-10-CM | POA: Diagnosis not present

## 2016-09-05 DIAGNOSIS — I214 Non-ST elevation (NSTEMI) myocardial infarction: Secondary | ICD-10-CM | POA: Diagnosis present

## 2016-09-05 DIAGNOSIS — I219 Acute myocardial infarction, unspecified: Secondary | ICD-10-CM | POA: Diagnosis not present

## 2016-09-05 DIAGNOSIS — R079 Chest pain, unspecified: Secondary | ICD-10-CM | POA: Diagnosis present

## 2016-09-05 DIAGNOSIS — E876 Hypokalemia: Secondary | ICD-10-CM | POA: Diagnosis not present

## 2016-09-05 DIAGNOSIS — I472 Ventricular tachycardia: Secondary | ICD-10-CM | POA: Diagnosis not present

## 2016-09-05 DIAGNOSIS — Z7982 Long term (current) use of aspirin: Secondary | ICD-10-CM | POA: Diagnosis not present

## 2016-09-05 DIAGNOSIS — Z791 Long term (current) use of non-steroidal anti-inflammatories (NSAID): Secondary | ICD-10-CM | POA: Diagnosis not present

## 2016-09-05 DIAGNOSIS — I255 Ischemic cardiomyopathy: Secondary | ICD-10-CM | POA: Diagnosis present

## 2016-09-05 DIAGNOSIS — I252 Old myocardial infarction: Secondary | ICD-10-CM | POA: Diagnosis not present

## 2016-09-05 HISTORY — PX: INTRAVASCULAR ULTRASOUND/IVUS: CATH118244

## 2016-09-05 HISTORY — PX: LEFT HEART CATH AND CORONARY ANGIOGRAPHY: CATH118249

## 2016-09-05 HISTORY — PX: CORONARY STENT INTERVENTION: CATH118234

## 2016-09-05 LAB — POCT I-STAT TROPONIN I
TROPONIN I, POC: 0.12 ng/mL — AB (ref 0.00–0.08)
TROPONIN I, POC: 0.62 ng/mL — AB (ref 0.00–0.08)

## 2016-09-05 LAB — LIPID PANEL
Cholesterol: 119 mg/dL (ref 0–200)
HDL: 46 mg/dL (ref >40)
LDL Cholesterol: 66 mg/dL (ref 0–99)
Total CHOL/HDL Ratio: 2.6 RATIO
Triglycerides: 33 mg/dL (ref <150)
VLDL: 7 mg/dL (ref 0–40)

## 2016-09-05 LAB — CBC
HEMATOCRIT: 38.3 % — AB (ref 39.0–52.0)
HEMOGLOBIN: 13.1 g/dL (ref 13.0–17.0)
MCH: 33.2 pg (ref 26.0–34.0)
MCHC: 34.2 g/dL (ref 30.0–36.0)
MCV: 97 fL (ref 78.0–100.0)
Platelets: 182 10*3/uL (ref 150–400)
RBC: 3.95 MIL/uL — AB (ref 4.22–5.81)
RDW: 12.2 % (ref 11.5–15.5)
WBC: 9.8 10*3/uL (ref 4.0–10.5)

## 2016-09-05 LAB — BASIC METABOLIC PANEL
Anion gap: 9 (ref 5–15)
BUN: 15 mg/dL (ref 6–20)
CO2: 27 mmol/L (ref 22–32)
Calcium: 8.8 mg/dL — ABNORMAL LOW (ref 8.9–10.3)
Chloride: 105 mmol/L (ref 101–111)
Creatinine, Ser: 1.1 mg/dL (ref 0.61–1.24)
GFR calc non Af Amer: >60 mL/min (ref >60)
Glucose, Bld: 123 mg/dL — ABNORMAL HIGH (ref 65–99)
POTASSIUM: 3.6 mmol/L (ref 3.5–5.1)
SODIUM: 141 mmol/L (ref 135–145)

## 2016-09-05 LAB — POCT ACTIVATED CLOTTING TIME
ACTIVATED CLOTTING TIME: 285 s
Activated Clotting Time: 362 seconds

## 2016-09-05 LAB — TROPONIN I
TROPONIN I: 0.87 ng/mL — AB (ref <0.03)
TROPONIN I: 1.23 ng/mL — AB (ref <0.03)
TROPONIN I: 12.64 ng/mL — AB (ref <0.03)

## 2016-09-05 LAB — PROTIME-INR
INR: 1.17
PROTHROMBIN TIME: 15 s (ref 11.4–15.2)

## 2016-09-05 LAB — MAGNESIUM: Magnesium: 1.9 mg/dL (ref 1.7–2.4)

## 2016-09-05 LAB — APTT: aPTT: 121 seconds — ABNORMAL HIGH (ref 24–36)

## 2016-09-05 LAB — HEPARIN LEVEL (UNFRACTIONATED): HEPARIN UNFRACTIONATED: 0.49 [IU]/mL (ref 0.30–0.70)

## 2016-09-05 SURGERY — LEFT HEART CATH AND CORONARY ANGIOGRAPHY
Anesthesia: LOCAL

## 2016-09-05 MED ORDER — NITROGLYCERIN 2 % TD OINT
1.0000 [in_us] | TOPICAL_OINTMENT | Freq: Once | TRANSDERMAL | Status: AC
  Administered 2016-09-05: 1 [in_us] via TOPICAL
  Filled 2016-09-05: qty 1

## 2016-09-05 MED ORDER — HEPARIN SODIUM (PORCINE) 1000 UNIT/ML IJ SOLN
INTRAMUSCULAR | Status: DC | PRN
  Administered 2016-09-05: 4000 [IU] via INTRAVENOUS
  Administered 2016-09-05: 6000 [IU] via INTRAVENOUS
  Administered 2016-09-05: 3000 [IU] via INTRAVENOUS

## 2016-09-05 MED ORDER — MORPHINE SULFATE (PF) 2 MG/ML IV SOLN
2.0000 mg | INTRAVENOUS | Status: DC | PRN
  Administered 2016-09-05: 2 mg via INTRAVENOUS
  Filled 2016-09-05: qty 1

## 2016-09-05 MED ORDER — SODIUM CHLORIDE 0.9 % IV SOLN
INTRAVENOUS | Status: DC
  Administered 2016-09-05: 12:00:00 via INTRAVENOUS

## 2016-09-05 MED ORDER — HEPARIN (PORCINE) IN NACL 2-0.9 UNIT/ML-% IJ SOLN
INTRAMUSCULAR | Status: DC | PRN
  Administered 2016-09-05: 1000 mL

## 2016-09-05 MED ORDER — VERAPAMIL HCL 2.5 MG/ML IV SOLN
INTRAVENOUS | Status: AC
  Filled 2016-09-05: qty 2

## 2016-09-05 MED ORDER — FENTANYL CITRATE (PF) 100 MCG/2ML IJ SOLN
INTRAMUSCULAR | Status: AC
  Filled 2016-09-05: qty 2

## 2016-09-05 MED ORDER — FENTANYL CITRATE (PF) 100 MCG/2ML IJ SOLN
INTRAMUSCULAR | Status: DC | PRN
  Administered 2016-09-05: 50 ug via INTRAVENOUS

## 2016-09-05 MED ORDER — MIDAZOLAM HCL 2 MG/2ML IJ SOLN
INTRAMUSCULAR | Status: DC | PRN
  Administered 2016-09-05: 2 mg via INTRAVENOUS

## 2016-09-05 MED ORDER — NITROGLYCERIN 2 % TD OINT: 0.5000 [in_us] | TOPICAL_OINTMENT | Freq: Once | TRANSDERMAL | Status: DC

## 2016-09-05 MED ORDER — SODIUM CHLORIDE 0.9 % WEIGHT BASED INFUSION: 3.0000 mL/kg/h | INTRAVENOUS | Status: DC

## 2016-09-05 MED ORDER — LISINOPRIL 10 MG PO TABS
20.0000 mg | ORAL_TABLET | Freq: Every day | ORAL | Status: DC
  Administered 2016-09-05 – 2016-09-07 (×3): 20 mg via ORAL
  Filled 2016-09-05 (×4): qty 2

## 2016-09-05 MED ORDER — IOPAMIDOL (ISOVUE-370) INJECTION 76%
INTRAVENOUS | Status: DC | PRN
  Administered 2016-09-05: 175 mL via INTRA_ARTERIAL

## 2016-09-05 MED ORDER — IOPAMIDOL (ISOVUE-370) INJECTION 76%
INTRAVENOUS | Status: AC
  Filled 2016-09-05: qty 100

## 2016-09-05 MED ORDER — ATORVASTATIN CALCIUM 80 MG PO TABS
80.0000 mg | ORAL_TABLET | Freq: Every day | ORAL | Status: DC
  Administered 2016-09-05 – 2016-09-07 (×3): 80 mg via ORAL
  Filled 2016-09-05 (×3): qty 1

## 2016-09-05 MED ORDER — SODIUM CHLORIDE 0.9 % WEIGHT BASED INFUSION: 1.0000 mL/kg/h | INTRAVENOUS | Status: DC

## 2016-09-05 MED ORDER — MIDAZOLAM HCL 2 MG/2ML IJ SOLN
INTRAMUSCULAR | Status: AC
  Filled 2016-09-05: qty 2

## 2016-09-05 MED ORDER — SODIUM CHLORIDE 0.9% FLUSH: 3.0000 mL | INTRAVENOUS | Status: DC | PRN

## 2016-09-05 MED ORDER — NITROGLYCERIN 0.4 MG SL SUBL: 0.4000 mg | SUBLINGUAL_TABLET | SUBLINGUAL | Status: DC | PRN

## 2016-09-05 MED ORDER — FUROSEMIDE 10 MG/ML IJ SOLN
40.0000 mg | Freq: Once | INTRAMUSCULAR | Status: AC
  Administered 2016-09-05: 12:00:00 40 mg via INTRAVENOUS
  Filled 2016-09-05: qty 4

## 2016-09-05 MED ORDER — SODIUM CHLORIDE 0.9 % IV SOLN: 250.0000 mL | INTRAVENOUS | Status: DC | PRN

## 2016-09-05 MED ORDER — LABETALOL HCL 5 MG/ML IV SOLN: 10.0000 mg | INTRAVENOUS | Status: DC | PRN

## 2016-09-05 MED ORDER — HEPARIN (PORCINE) IN NACL 2-0.9 UNIT/ML-% IJ SOLN
INTRAMUSCULAR | Status: AC
  Filled 2016-09-05: qty 1000

## 2016-09-05 MED ORDER — ONDANSETRON HCL 4 MG/2ML IJ SOLN
4.0000 mg | Freq: Once | INTRAMUSCULAR | Status: AC
  Administered 2016-09-05: 4 mg via INTRAVENOUS
  Filled 2016-09-05: qty 2

## 2016-09-05 MED ORDER — TICAGRELOR 90 MG PO TABS
90.0000 mg | ORAL_TABLET | Freq: Two times a day (BID) | ORAL | Status: DC
  Administered 2016-09-05 – 2016-09-07 (×4): 90 mg via ORAL
  Filled 2016-09-05 (×4): qty 1

## 2016-09-05 MED ORDER — SODIUM CHLORIDE 0.9 % IV SOLN
250.0000 mL | INTRAVENOUS | Status: DC | PRN
Start: 2016-09-05 — End: 2016-09-07

## 2016-09-05 MED ORDER — NITROGLYCERIN 2 % TD OINT
0.5000 [in_us] | TOPICAL_OINTMENT | Freq: Four times a day (QID) | TRANSDERMAL | Status: DC
  Filled 2016-09-05: qty 30

## 2016-09-05 MED ORDER — ONDANSETRON HCL 4 MG/2ML IJ SOLN: 4.0000 mg | Freq: Four times a day (QID) | INTRAMUSCULAR | Status: DC | PRN

## 2016-09-05 MED ORDER — TICAGRELOR 90 MG PO TABS
ORAL_TABLET | ORAL | Status: AC
  Filled 2016-09-05: qty 2

## 2016-09-05 MED ORDER — MORPHINE SULFATE (PF) 2 MG/ML IV SOLN: 2.0000 mg | INTRAVENOUS | Status: DC | PRN

## 2016-09-05 MED ORDER — NITROGLYCERIN 1 MG/10 ML FOR IR/CATH LAB
INTRA_ARTERIAL | Status: AC
  Filled 2016-09-05: qty 10

## 2016-09-05 MED ORDER — FENTANYL CITRATE (PF) 100 MCG/2ML IJ SOLN
50.0000 ug | Freq: Once | INTRAMUSCULAR | Status: AC
  Administered 2016-09-05: 50 ug via INTRAVENOUS
  Filled 2016-09-05: qty 2

## 2016-09-05 MED ORDER — LISINOPRIL-HYDROCHLOROTHIAZIDE 20-12.5 MG PO TABS: 1.0000 | ORAL_TABLET | Freq: Every day | ORAL | Status: DC

## 2016-09-05 MED ORDER — HYDRALAZINE HCL 20 MG/ML IJ SOLN: 5.0000 mg | INTRAMUSCULAR | Status: DC | PRN

## 2016-09-05 MED ORDER — TICAGRELOR 90 MG PO TABS
ORAL_TABLET | ORAL | Status: DC | PRN
  Administered 2016-09-05: 180 mg via ORAL

## 2016-09-05 MED ORDER — AMLODIPINE BESYLATE 5 MG PO TABS
5.0000 mg | ORAL_TABLET | Freq: Every day | ORAL | Status: DC
  Administered 2016-09-05 – 2016-09-07 (×3): 5 mg via ORAL
  Filled 2016-09-05 (×3): qty 1

## 2016-09-05 MED ORDER — HEART ATTACK BOUNCING BOOK
Freq: Once | Status: AC
  Administered 2016-09-05: 22:00:00
  Filled 2016-09-05: qty 1

## 2016-09-05 MED ORDER — HYDROCHLOROTHIAZIDE 12.5 MG PO CAPS
12.5000 mg | ORAL_CAPSULE | Freq: Every day | ORAL | Status: DC
  Administered 2016-09-05: 12.5 mg via ORAL
  Filled 2016-09-05: qty 1

## 2016-09-05 MED ORDER — SODIUM CHLORIDE 0.9 % IV SOLN
INTRAVENOUS | Status: DC
  Administered 2016-09-05: 02:00:00 via INTRAVENOUS

## 2016-09-05 MED ORDER — HEPARIN SODIUM (PORCINE) 1000 UNIT/ML IJ SOLN
INTRAMUSCULAR | Status: AC
  Filled 2016-09-05: qty 1

## 2016-09-05 MED ORDER — ACETAMINOPHEN 325 MG PO TABS: 650.0000 mg | ORAL_TABLET | ORAL | Status: DC | PRN

## 2016-09-05 MED ORDER — SODIUM CHLORIDE 0.9% FLUSH
3.0000 mL | Freq: Two times a day (BID) | INTRAVENOUS | Status: DC
  Administered 2016-09-05: 3 mL via INTRAVENOUS

## 2016-09-05 MED ORDER — SODIUM CHLORIDE 0.9% FLUSH: 3.0000 mL | Freq: Two times a day (BID) | INTRAVENOUS | Status: DC

## 2016-09-05 MED ORDER — METOPROLOL TARTRATE 25 MG PO TABS
25.0000 mg | ORAL_TABLET | Freq: Two times a day (BID) | ORAL | Status: DC
  Administered 2016-09-05 (×3): 25 mg via ORAL
  Filled 2016-09-05 (×3): qty 1

## 2016-09-05 MED ORDER — SODIUM CHLORIDE 0.9% FLUSH
3.0000 mL | Freq: Two times a day (BID) | INTRAVENOUS | Status: DC
  Administered 2016-09-05 – 2016-09-06 (×3): 3 mL via INTRAVENOUS

## 2016-09-05 MED ORDER — MORPHINE SULFATE (PF) 4 MG/ML IV SOLN: 2.0000 mg | Freq: Once | INTRAVENOUS | Status: DC

## 2016-09-05 MED ORDER — ASPIRIN 81 MG PO CHEW
81.0000 mg | CHEWABLE_TABLET | Freq: Every day | ORAL | Status: DC
  Administered 2016-09-05 – 2016-09-07 (×3): 81 mg via ORAL
  Filled 2016-09-05 (×3): qty 1

## 2016-09-05 MED ORDER — VERAPAMIL HCL 2.5 MG/ML IV SOLN
INTRAVENOUS | Status: DC | PRN
  Administered 2016-09-05: 10 mL via INTRA_ARTERIAL

## 2016-09-05 MED ORDER — LIDOCAINE HCL (PF) 1 % IJ SOLN
INTRAMUSCULAR | Status: AC
  Filled 2016-09-05: qty 30

## 2016-09-05 MED ORDER — LIDOCAINE HCL (PF) 1 % IJ SOLN
INTRAMUSCULAR | Status: DC | PRN
  Administered 2016-09-05: 2 mL

## 2016-09-05 MED ORDER — ANGIOPLASTY BOOK
Freq: Once | Status: AC
  Administered 2016-09-05: 22:00:00
  Filled 2016-09-05: qty 1

## 2016-09-05 SURGICAL SUPPLY — 22 items
BALLN EUPHORA RX 2.5X15 (BALLOONS) ×2
BALLN ~~LOC~~ EUPHORA RX 4.5X12 (BALLOONS) ×2
BALLOON EUPHORA RX 2.5X15 (BALLOONS) IMPLANT
BALLOON ~~LOC~~ EUPHORA RX 4.5X12 (BALLOONS) IMPLANT
CATH 5FR JL3.5 JR4 ANG PIG MP (CATHETERS) ×1 IMPLANT
CATH GUIDEZILLA II 6F (CATHETERS) IMPLANT
CATH OPTICROSS 40MHZ (CATHETERS) ×1 IMPLANT
CATH VISTA GUIDE 6FR XBLAD3.5 (CATHETERS) ×1 IMPLANT
CATHETER GUIDEZILLA II 6F (CATHETERS) ×2
DEVICE RAD COMP TR BAND LRG (VASCULAR PRODUCTS) ×1 IMPLANT
GLIDESHEATH SLEND SS 6F .021 (SHEATH) ×1 IMPLANT
GUIDEWIRE INQWIRE 1.5J.035X260 (WIRE) IMPLANT
INQWIRE 1.5J .035X260CM (WIRE) ×2
KIT ENCORE 26 ADVANTAGE (KITS) ×1 IMPLANT
KIT HEART LEFT (KITS) ×2 IMPLANT
PACK CARDIAC CATHETERIZATION (CUSTOM PROCEDURE TRAY) ×2 IMPLANT
SLED PULL BACK IVUS (MISCELLANEOUS) ×1 IMPLANT
STENT SYNERGY DES 4X16 (Permanent Stent) ×1 IMPLANT
SYR MEDRAD MARK V 150ML (SYRINGE) ×2 IMPLANT
TRANSDUCER W/STOPCOCK (MISCELLANEOUS) ×2 IMPLANT
TUBING CIL FLEX 10 FLL-RA (TUBING) ×2 IMPLANT
WIRE COUGAR XT STRL 190CM (WIRE) ×1 IMPLANT

## 2016-09-05 NOTE — Progress Notes (Signed)
Thank you Maely Clements 

## 2016-09-05 NOTE — ED Notes (Signed)
Text page made to [facility] MD, repeat i-stat trop 0.62

## 2016-09-05 NOTE — Progress Notes (Signed)
'    Called my Rosanne Ashing, RN for PVCS and NSVT. K normal. Will check a mag. Continue Lopressor 25mg  BID for now.  Cline Crock PA-C  [facility]

## 2016-09-05 NOTE — Progress Notes (Signed)
CRITICAL VALUE ALERT  Critical value received:  Troponin 0.87  Date of notification:  09/05/16  Time of notification:  0224  Critical value read back: Yes  Nurse who received alert:  Jodene Nam, RN  MD notified (1st page):  Orson Aloe  Time of first page:  0244  MD notified (2nd page):  Time of second page:  Responding MD:  Orson Aloe  Time MD responded:  [phone]

## 2016-09-05 NOTE — Interval H&P Note (Signed)
History and Physical Interval Note:  09/05/2016 9:31 AM  Kevin Patel  has presented today for cardiac cath with the diagnosis of NSTEMI. The various methods of treatment have been discussed with the patient and family. After consideration of risks, benefits and other options for treatment, the patient has consented to  Procedure(s): Left Heart Cath and Coronary Angiography (N/A) as a surgical intervention .  The patient's history has been reviewed, patient examined, no change in status, stable for surgery.  I have reviewed the patient's chart and labs.  Questions were answered to the patient's satisfaction.    Cath Lab Visit (complete for each Cath Lab visit)  Clinical Evaluation Leading to the Procedure:   ACS: Yes.    Non-ACS:    Anginal Classification: CCS IV  Anti-ischemic medical therapy: Maximal Therapy (2 or more classes of medications)  Non-Invasive Test Results: No non-invasive testing performed  Prior CABG: No previous CABG         Verne Carrow

## 2016-09-05 NOTE — Progress Notes (Signed)
ANTICOAGULATION CONSULT NOTE - Follow Up Consult  Pharmacy Consult for heparin Indication: chest pain/ACS  No Known Allergies  Patient Measurements: Height: 6\' 6"  (198.1 cm) Weight: 264 lb 8.8 oz (120 kg) IBW/kg (Calculated) : 91.4 Heparin Dosing Weight: 110 Kg  Vital Signs: Temp: 97.9 F (36.6 C) (03/29 0547) Temp Source: Oral (03/29 0547) BP: 111/64 (03/29 0547) Pulse Rate: 71 (03/29 0547)  Labs:  Recent Labs  09/04/16 2226 09/05/16 0134 09/05/16 0205 09/05/16 0708  HGB 13.6  --   --   --   HCT 39.3  --   --   --   PLT 188  --   --   --   APTT  --  121*  --   --   LABPROT  --  15.0  --   --   INR  --  1.17  --   --   HEPARINUNFRC  --   --   --  0.49  CREATININE 1.18  --   --   --   TROPONINI  --  0.87* 1.23*  --     Estimated Creatinine Clearance: 84.7 mL/min (by C-G formula based on SCr of 1.18 mg/dL).   Medications:  Prescriptions Prior to Admission  Medication Sig Dispense Refill Last Dose  . amLODipine (NORVASC) 5 MG tablet TAKE ONE TABLET BY MOUTH ONCE DAILY 90 tablet 3 09/04/2016 at Unknown time  . aspirin 81 MG tablet Take 81 mg by mouth every morning.    09/04/2016 at Unknown time  . atorvastatin (LIPITOR) 80 MG tablet TAKE ONE TABLET BY MOUTH ONCE DAILY 90 tablet 3 09/04/2016 at Unknown time  . Flaxseed, Linseed, (FLAXSEED OIL PO) Take 1 tablet by mouth daily.    09/04/2016 at Unknown time  . GLUCOSAMINE HCL PO Take 1 tablet by mouth daily.   09/04/2016 at Unknown time  . lisinopril-hydrochlorothiazide (PRINZIDE,ZESTORETIC) 20-12.5 MG tablet TAKE ONE TABLET BY MOUTH TWICE DAILY 180 tablet 3 09/04/2016 at Unknown time  . meloxicam (MOBIC) 15 MG tablet Take 15 mg by mouth every evening.    09/03/2016 at Unknown time  . metoprolol (LOPRESSOR) 50 MG tablet TAKE ONE-HALF TABLET BY MOUTH TWICE DAILY 90 tablet 3 09/04/2016 at 0830  . Multiple Vitamin (MULTIVITAMIN) capsule Take 1 capsule by mouth daily.   09/04/2016 at Unknown time  . vitamin C (ASCORBIC ACID) 500 MG  tablet Take 500 mg by mouth daily.   09/04/2016 at Unknown time  . vitamin E 400 UNIT capsule Take 400 Units by mouth daily.   09/04/2016 at Unknown time   Assessment: 71 y.o. male with PMH CAD s/p stent in 1998 started on heparin infusion for chest pain + troponin. No anticoagulation PTA. Baseline CBC WNL. No bleeding or infusion issues reported.  Heparin level: 0.49  Goal of Therapy:  Heparin level 0.3-0.7 units/ml Monitor platelets by anticoagulation protocol: Yes   Plan:  Heparin gtt at 1400 units/hr Daily CBC/Heparin level F/U cards plans for cath Monitor s/sx of bleeding  Ruben Im, PharmD Clinical Pharmacist Pager: [phone] 09/05/2016 8:22 AM

## 2016-09-05 NOTE — H&P (View-Only) (Signed)
Progress Note  Patient Name: Kevin Patel Date of Encounter: 09/05/2016  Primary Cardiologist: Dr. Erlene Quan   Subjective   Still with chest fullness 3/10 has never resolved.  Could not tolerate NTG makes nauseated.  No SOB and no nausea now.    Inpatient Medications    Scheduled Meds: . amLODipine  5 mg Oral Daily  . aspirin  81 mg Oral Daily  . atorvastatin  80 mg Oral Daily  . lisinopril  20 mg Oral Daily   And  . hydrochlorothiazide  12.5 mg Oral Daily  . metoprolol  25 mg Oral BID  . nitroGLYCERIN  0.5 inch Topical Q6H  . sodium chloride flush  3 mL Intravenous Q12H   Continuous Infusions: . sodium chloride 60 mL/hr at 09/05/16 0208  . heparin 1,400 Units/hr (09/04/16 2354)   PRN Meds: sodium chloride, acetaminophen, morphine injection, nitroGLYCERIN, ondansetron (ZOFRAN) IV, sodium chloride flush   Vital Signs    Vitals:   09/05/16 0101 09/05/16 0130 09/05/16 0149 09/05/16 0547  BP: 117/85 123/87 129/84 111/64  Pulse: 85 81 84 71  Resp: 18 17 18 19   Temp:   97.8 F (36.6 C) 97.9 F (36.6 C)  TempSrc:   Oral Oral  SpO2: 93% 97% 97% 97%  Weight:   264 lb 8.8 oz (120 kg)   Height:   6\' 6"  (1.981 m)     Intake/Output Summary (Last 24 hours) at 09/05/16 0718 Last data filed at 09/05/16 0300  Gross per 24 hour  Intake             95.4 ml  Output                0 ml  Net             95.4 ml   Filed Weights   09/04/16 2155 09/05/16 0149  Weight: 260 lb (117.9 kg) 264 lb 8.8 oz (120 kg)    Telemetry    SR with PVCs, short burst NSVT 5 beats - Personally Reviewed  ECG    New ST depression lat leads and I,II   Improved on 5 am EKG. - Personally Reviewed  Physical Exam   GEN: No acute distress.   Neck: No JVD Cardiac: RRR, no murmurs, rubs, or gallops.  Respiratory: Clear to auscultation bilaterally. GI: Soft, nontender, non-distended  MS: No edema; No deformity. Neuro:  Nonfocal  Psych: Normal affect   Labs    Chemistry Recent Labs Lab  09/04/16 2226  NA 141  K 3.7  CL 104  CO2 27  GLUCOSE 162*  BUN 16  CREATININE 1.18  CALCIUM 9.2  GFRNONAA >60  GFRAA >60  ANIONGAP 10     Hematology Recent Labs Lab 09/04/16 2226  WBC 12.4*  RBC 4.05*  HGB 13.6  HCT 39.3  MCV 97.0  MCH 33.6  MCHC 34.6  RDW 12.3  PLT 188    Cardiac Enzymes Recent Labs Lab 09/05/16 0134 09/05/16 0205  TROPONINI 0.87* 1.23*    Recent Labs Lab 09/04/16 2231 09/05/16 0142  TROPIPOC 0.12* 0.62*     BNPNo results for input(s): BNP, PROBNP in the last 168 hours.   DDimer No results for input(s): DDIMER in the last 168 hours.   Radiology    Dg Chest 2 View  Result Date: 09/04/2016 CLINICAL DATA:  Central chest pain beginning earlier today. EXAM: CHEST  2 VIEW COMPARISON:  09/15/2015 FINDINGS: Cardiac silhouette is top-normal in size. There is no  aortic aneurysm. No pulmonary consolidations noted. There is left basilar scarring. Rounded density overlying the anterior fourth rib may represent a bone island or calcified granuloma. This is unchanged in appearance. Similar finding in the left upper lobe with more oblong density overlying the posterior left ninth rib which is more likely a bone island or summation of vascular and rib shadows. IMPRESSION: No active cardiopulmonary disease.  Scarring at the left lung base. Electronically Signed   By: Tollie Eth M.D.   On: 09/04/2016 22:23    Cardiac Studies   For cardiac cath.   Patient Profile     71 y.o. 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  + troponin.    Assessment & Plan    1.  NSTEMI Patient with recurrent chest pain in the setting of a point of care troponin being elevated now 1.23.   ECG with ischemic changes and he continues to have chest pain that improved  to 3/1  He appears to be hemodynamically stable.  Patient has received aspirin and was started on heparin in the emergency department. - did not tolerate NTG paste causes nausea  IV morphine as needed. - Continue heparin and low-dose aspirin. - on for cardiac catheterization.  ? Need to move up -  Pk  troponin at 1.23 level at this time. - Continue home metoprolol and Lipitor.  2.  Coronary artery disease Known history of coronary artery disease with prior stent of unknown anatomy. In 1998 Here now with NSTEMI.  Patient was on guideline-directed medical therapy prior with metoprolol, Lipitor, and aspirin. - Continue home medications for coronary artery disease.   3. Essential hypertension Patient with known essential hypertension.  He does not have any overt end-organ damage.  Takes Zestoretic and metoprolol at home. - Continue home blood pressure medications.  4 Elevated blood glucose Blood glucose is mildly elevated.  Likely from an acute phase reaction. - Monitor blood glucose for now.  Check A1C.  5 Prophylaxis - Heparin drip.  6. HLD with LDL 66 continue statin.  The patient understands that risks included but are not limited to stroke (1 in 1000), death (1 in 1000), kidney failure [usually temporary] (1 in 500), bleeding (1 in 200), allergic reaction [possibly serious] (1 in 200).    Signed, Kevin Boozer, NP  09/05/2016, 7:18 AM    I have seen and examined the patient along with Kevin Boozer, NP.  I have reviewed the chart, notes and new data.  I agree with NP's note.  Key new complaints: ongoing chest discomfort; improved after morphine, but not resolved Key examination changes: no clinical signs of CHF; frequent ectopy (PACs on monitor). Key new findings / data: ECG with ST depression V5-V6, II, improved on follow up ECG. Abnorma troponin.   PLAN: NSTEMI with ongoing symptoms. Urgent cardiac cath, will try to move earlier on today's schedule.  [name], MD,  [facility] [phone] 09/05/2016, 8:17 AM

## 2016-09-05 NOTE — H&P (Signed)
History & Physical    Patient ID: Kevin Patel MRN: [id number], DOB/AGE: July 16, 1945   Admit date: 09/04/2016   Primary Physician: Feliciana Rossetti, MD Primary Cardiologist: Nanetta Batty, MD  History of Present Illness    Kevin Patel is a 71 y.o. 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  Kevin Patel says at around 4 pm while out in the yard using his "weed wacker" he started noticing some chest pain that was in the center of his chest.  The pain did go to his back but no other location.  He says the pain feels like "pressure", off and on, made worse with exerting himself, and relieved with resting.  Patient continued to have pain throughout the day and it eventually go worse.  When he was having his chest pain, he did not have any other symptoms such as lightheadedness, shortness of breath, sweating, or nausea.  Due to his chest pain, he decided to call 911 to his home.  Prior to calling 911, he decided to take three 325 mg aspirin.  At this time, he continues to have 3/10 intensity chest pain.  He did receive nitroglycerin by emergency medical services for which he says did not help and did lower his blood pressure.  In the last two weeks, he has had similar chest pain brought on by exertion that only last 10-15 minutes.  The last time he had similar chest pain was when he had his first heart attack.    Past Medical History    Past Medical History:  Diagnosis Date  . HTN (hypertension)   . Hyperlipidemia   . Ischemic cardiomyopathy   . MI (myocardial infarction) 1998   myoview 09/25/2006-no ischemia, EF 46%  . Mural thrombus of heart    on coumadin until follow up echo with contrast in Sept 13, 2012 which showed no thrombus and coumadin was d/c    Past Surgical History:    Procedure Laterality Date  . CORONARY ANGIOPLASTY  1998   PCI     Allergies  No Known Allergies   Home Medications    Prior to Admission medications   Medication Sig Start Date End Date Taking? Authorizing Provider  amLODipine (NORVASC) 5 MG tablet TAKE ONE TABLET BY MOUTH ONCE DAILY 03/07/16  Yes Runell Gess, MD  aspirin 81 MG tablet Take 81 mg by mouth every morning.    Yes Historical Provider, MD  atorvastatin (LIPITOR) 80 MG tablet TAKE ONE TABLET BY MOUTH ONCE DAILY 03/07/16  Yes Runell Gess, MD  Flaxseed, Linseed, (FLAXSEED OIL PO) Take 1 tablet by mouth daily.    Yes Historical Provider, MD  GLUCOSAMINE HCL PO Take 1 tablet by mouth daily.   Yes Historical Provider, MD  lisinopril-hydrochlorothiazide (PRINZIDE,ZESTORETIC) 20-12.5 MG tablet TAKE ONE TABLET BY MOUTH TWICE DAILY 03/07/16  Yes Runell Gess, MD  meloxicam (MOBIC) 15 MG tablet Take 15 mg by mouth every evening.  02/01/14  Yes Historical Provider, MD  metoprolol (LOPRESSOR) 50 MG tablet TAKE ONE-HALF TABLET BY MOUTH TWICE DAILY 03/07/16  Yes Runell Gess, MD  Multiple Vitamin (MULTIVITAMIN) capsule Take 1 capsule by mouth daily.   Yes Historical Provider, MD  vitamin C (ASCORBIC ACID) 500 MG tablet Take 500 mg by mouth daily.   Yes Historical Provider, MD  vitamin E 400 UNIT capsule Take 400 Units by mouth daily.   Yes Historical Provider, MD    Family History    Family History  Problem Relation Age of Onset  . Emphysema Father     Social History    Social History   Social History  . Marital status: Married    Spouse name: N/A  . Number of children: N/A  . Years of education: N/A   Occupational History  . Not on file.   Social History Main Topics  . Smoking status: Former Smoker    Quit date: 02/12/1993  . Smokeless tobacco: Never Used  . Alcohol use No  . Drug use: No  . Sexual activity: Not on file   Other Topics Concern  . Not on file   Social History Narrative  . No  narrative on file     Review of Systems    All other systems reviewed and are otherwise negative except as noted above.  Physical Exam    Blood pressure 135/84, pulse 84, temperature 97.7 F (36.5 C), temperature source Oral, resp. rate 15, height 6\' 4"  (1.93 m), weight 117.9 kg (260 lb), SpO2 97 %.  General: Well developed, well nourished,male in no acute distress. Head: Normocephalic, atraumatic, sclera non-icteric, no xanthomas, nares are without discharge. Dentition:  Neck: No carotid bruits. JVD not elevated.  Lungs: Respirations regular and unlabored, without wheezes or rales.  Heart: Regular rate and rhythm. No S3 or S4.  No murmur, no rubs, or gallops appreciated. Abdomen: Soft, non-tender, non-distended with normoactive bowel sounds. No hepatomegaly. No rebound/guarding. No obvious abdominal masses. Msk:  Strength and tone appear normal for age. No joint deformities or effusions. Extremities: No clubbing or cyanosis. No edema.  Distal pedal pulses are 2+ bilaterally. Neuro: Alert and oriented X 3. Moves all extremities spontaneously. No focal deficits noted. Psych:  Responds to questions appropriately with a normal affect. Skin: No rashes or lesions noted  Labs    Troponin (Point of Care Test) No results for input(s): TROPIPOC in the last 72 hours. No results for input(s): CKTOTAL, CKMB, TROPONINI in the last 72 hours. Lab Results  Component Value Date   WBC 12.4 (H) 09/04/2016   HGB 13.6 09/04/2016   HCT 39.3 09/04/2016   MCV 97.0 09/04/2016   PLT 188 09/04/2016    Recent Labs Lab 09/04/16 2226  NA 141  K 3.7  CL 104  CO2 27  BUN 16  CREATININE 1.18  CALCIUM 9.2  GLUCOSE 162*   Lab Results  Component Value Date   CHOL 121 03/10/2015   HDL 51 03/10/2015   LDLCALC 56 03/10/2015   TRIG 72 03/10/2015   No results found for: DDIMER  No results found for: BNP No results found for: PROBNP No results for input(s): INR in the last 72 hours.    Radiology  Studies    Dg Chest 2 View  Result Date: 09/04/2016 CLINICAL DATA:  Central chest pain beginning earlier today. EXAM: CHEST  2 VIEW COMPARISON:  09/15/2015 FINDINGS: Cardiac silhouette is top-normal in size. There is no aortic aneurysm. No pulmonary consolidations noted. There is left basilar scarring. Rounded density overlying the anterior fourth rib may represent a bone island or calcified granuloma. This is unchanged in appearance. Similar finding in the left upper lobe with more oblong density overlying the posterior left ninth rib which is more likely a bone island or summation of vascular and rib shadows. IMPRESSION: No active cardiopulmonary disease.  Scarring at the left lung base. Electronically Signed   By: Tollie Eth M.D.   On: 09/04/2016 22:23    EKG & Cardiac Imaging    EKG:  09/04/16:  NSR; lateral ST-depressions; frequent PVCs  ECHOCARDIOGRAM: 03/29/11:  Moderate systolic heart failure with EF 35%-40%; anteroseptal/apical hypokinesis.  Assessment & Plan    Principal Problem:   NSTEMI (non-ST elevated myocardial infarction) [facility]) Active Problems:   Essential hypertension   Hyperlipidemia   Chest pain  # NSTEMI Patient with recurrent chest pain in the setting of a point of care troponin being elevated (stated to be at 0.1 ng/dL).  Patient likely suffering from NSTEMI.  ECG with ischemic changes and he continues to have chest pain that is slowly improving.  He appears to be hemodynamically stable.  Patient has received aspirin and was started on heparin in the emergency department. - Will do a controlled trial of nitro paste to see if it improves his chest discomfort with IV morphine as needed. - Continue heparin and low-dose aspirin. - Will discuss arranging a cardia catheterization. - Trend troponin level at this time. - Continue home metoprolol and Lipitor.  # Coronary artery disease Known history of coronary artery disease with prior stent of unknown anatomy.  Here  now with NSTEMI.  Patient was on guideline-directed medical therapy prior with metoprolol, Lipitor, and aspirin. - Continue home medications for coronary artery disease. - Repeat lipid panel.  # Essential hypertension Patient with known essential hypertension.  He does not have any overt end-organ damage.  Takes Zestoretic and metoprolol at home. - Continue home blood pressure medications.  # Elevated blood glucose Blood glucose is mildly elevated.  Likely from an acute phase reaction. - Monitor blood glucose for now.  Check A1C.  # Prophylaxis - Heparin drip.   Signed, Judie Grieve, MD 09/05/2016, 12:53 AM

## 2016-09-05 NOTE — Care Management Note (Signed)
Case Management Note  Patient Details  Name: Kevin Patel MRN: [id number] Date of Birth: 09-26-1945  Subjective/Objective:  From home with wife, s/p coronary stent intervention, pta indep, has medication coverage and PCP.  NCM gave patient the 30 day savings card.  And copay is 351.99, his deductible has not been met yet.  NCM informed him if he find out that his co pay is to high after deductible is meet to let MD know and he will change it to something else.                   Action/Plan:   Expected Discharge Date:  09/07/16               Expected Discharge Plan:  Home/Self Care  In-House Referral:     Discharge planning Services  CM Consult  Post Acute Care Choice:    Choice offered to:     DME Arranged:    DME Agency:     HH Arranged:    HH Agency:     Status of Service:  Completed, signed off  If discussed at [employer] of Stay Meetings, dates discussed:    Additional Comments:  Zenon Mayo, RN 09/05/2016, 3:52 PM

## 2016-09-05 NOTE — Progress Notes (Signed)
BRILINTA  90 MG BID   COVER- YES  CO-PAY- $ 351.99 DEDUCTIBLE NOT MET  TIER- 3 DRUG  PRIOR APPROVAL- NO   PREFERRED PHARMACY : [facility] , [facility] , [facility] AND [facility]

## 2016-09-05 NOTE — Progress Notes (Signed)
TR BAND REMOVAL  LOCATION:  right radial  DEFLATED PER PROTOCOL:  Yes.    TIME BAND OFF / DRESSING APPLIED:   1530   SITE UPON ARRIVAL:   Level 0  SITE AFTER BAND REMOVAL:  Level 0  CIRCULATION SENSATION AND MOVEMENT:  Within Normal Limits  Yes.    COMMENTS:    

## 2016-09-05 NOTE — ED Notes (Signed)
Pt called out stating he was nauseous and diaphoretic, BP rechecked noted to be 101/74. Per Cammy Copa PA take off NTG paste and admin Zofran. Start running bolus.

## 2016-09-05 NOTE — Progress Notes (Signed)
Progress Note  Patient Name: Kevin Patel Date of Encounter: 09/05/2016  Primary Cardiologist: Dr. Erlene Quan   Subjective   Still with chest fullness 3/10 has never resolved.  Could not tolerate NTG makes nauseated.  No SOB and no nausea now.    Inpatient Medications    Scheduled Meds: . amLODipine  5 mg Oral Daily  . aspirin  81 mg Oral Daily  . atorvastatin  80 mg Oral Daily  . lisinopril  20 mg Oral Daily   And  . hydrochlorothiazide  12.5 mg Oral Daily  . metoprolol  25 mg Oral BID  . nitroGLYCERIN  0.5 inch Topical Q6H  . sodium chloride flush  3 mL Intravenous Q12H   Continuous Infusions: . sodium chloride 60 mL/hr at 09/05/16 0208  . heparin 1,400 Units/hr (09/04/16 2354)   PRN Meds: sodium chloride, acetaminophen, morphine injection, nitroGLYCERIN, ondansetron (ZOFRAN) IV, sodium chloride flush   Vital Signs    Vitals:   09/05/16 0101 09/05/16 0130 09/05/16 0149 09/05/16 0547  BP: 117/85 123/87 129/84 111/64  Pulse: 85 81 84 71  Resp: 18 17 18 19   Temp:   97.8 F (36.6 C) 97.9 F (36.6 C)  TempSrc:   Oral Oral  SpO2: 93% 97% 97% 97%  Weight:   264 lb 8.8 oz (120 kg)   Height:   6\' 6"  (1.981 m)     Intake/Output Summary (Last 24 hours) at 09/05/16 0718 Last data filed at 09/05/16 0300  Gross per 24 hour  Intake             95.4 ml  Output                0 ml  Net             95.4 ml   Filed Weights   09/04/16 2155 09/05/16 0149  Weight: 260 lb (117.9 kg) 264 lb 8.8 oz (120 kg)    Telemetry    SR with PVCs, short burst NSVT 5 beats - Personally Reviewed  ECG    New ST depression lat leads and I,II   Improved on 5 am EKG. - Personally Reviewed  Physical Exam   GEN: No acute distress.   Neck: No JVD Cardiac: RRR, no murmurs, rubs, or gallops.  Respiratory: Clear to auscultation bilaterally. GI: Soft, nontender, non-distended  MS: No edema; No deformity. Neuro:  Nonfocal  Psych: Normal affect   Labs    Chemistry Recent Labs Lab  09/04/16 2226  NA 141  K 3.7  CL 104  CO2 27  GLUCOSE 162*  BUN 16  CREATININE 1.18  CALCIUM 9.2  GFRNONAA >60  GFRAA >60  ANIONGAP 10     Hematology Recent Labs Lab 09/04/16 2226  WBC 12.4*  RBC 4.05*  HGB 13.6  HCT 39.3  MCV 97.0  MCH 33.6  MCHC 34.6  RDW 12.3  PLT 188    Cardiac Enzymes Recent Labs Lab 09/05/16 0134 09/05/16 0205  TROPONINI 0.87* 1.23*    Recent Labs Lab 09/04/16 2231 09/05/16 0142  TROPIPOC 0.12* 0.62*     BNPNo results for input(s): BNP, PROBNP in the last 168 hours.   DDimer No results for input(s): DDIMER in the last 168 hours.   Radiology    Dg Chest 2 View  Result Date: 09/04/2016 CLINICAL DATA:  Central chest pain beginning earlier today. EXAM: CHEST  2 VIEW COMPARISON:  09/15/2015 FINDINGS: Cardiac silhouette is top-normal in size. There is no  aortic aneurysm. No pulmonary consolidations noted. There is left basilar scarring. Rounded density overlying the anterior fourth rib may represent a bone island or calcified granuloma. This is unchanged in appearance. Similar finding in the left upper lobe with more oblong density overlying the posterior left ninth rib which is more likely a bone island or summation of vascular and rib shadows. IMPRESSION: No active cardiopulmonary disease.  Scarring at the left lung base. Electronically Signed   By: Tollie Eth M.D.   On: 09/04/2016 22:23    Cardiac Studies   For cardiac cath.   Patient Profile     71 y.o. 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  + troponin.    Assessment & Plan    1.  NSTEMI Patient with recurrent chest pain in the setting of a point of care troponin being elevated now 1.23.   ECG with ischemic changes and he continues to have chest pain that improved  to 3/1  He appears to be hemodynamically stable.  Patient has received aspirin and was started on heparin in the emergency department. - did not tolerate NTG paste causes nausea  IV morphine as needed. - Continue heparin and low-dose aspirin. - on for cardiac catheterization.  ? Need to move up -  Pk  troponin at 1.23 level at this time. - Continue home metoprolol and Lipitor.  2.  Coronary artery disease Known history of coronary artery disease with prior stent of unknown anatomy. In 1998 Here now with NSTEMI.  Patient was on guideline-directed medical therapy prior with metoprolol, Lipitor, and aspirin. - Continue home medications for coronary artery disease.   3. Essential hypertension Patient with known essential hypertension.  He does not have any overt end-organ damage.  Takes Zestoretic and metoprolol at home. - Continue home blood pressure medications.  4 Elevated blood glucose Blood glucose is mildly elevated.  Likely from an acute phase reaction. - Monitor blood glucose for now.  Check A1C.  5 Prophylaxis - Heparin drip.  6. HLD with LDL 66 continue statin.  The patient understands that risks included but are not limited to stroke (1 in 1000), death (1 in 1000), kidney failure [usually temporary] (1 in 500), bleeding (1 in 200), allergic reaction [possibly serious] (1 in 200).    Signed, Nada Boozer, NP  09/05/2016, 7:18 AM    I have seen and examined the patient along with Nada Boozer, NP.  I have reviewed the chart, notes and new data.  I agree with NP's note.  Key new complaints: ongoing chest discomfort; improved after morphine, but not resolved Key examination changes: no clinical signs of CHF; frequent ectopy (PACs on monitor). Key new findings / data: ECG with ST depression V5-V6, II, improved on follow up ECG. Abnorma troponin.   PLAN: NSTEMI with ongoing symptoms. Urgent cardiac cath, will try to move earlier on today's schedule.  [name], MD,  [facility] [phone] 09/05/2016, 8:17 AM

## 2016-09-05 NOTE — Progress Notes (Signed)
CRITICAL VALUE ALERT  Critical value received: 12.64  Date of notification:  09/05/2016  Time of notification:  0932  Critical value read back:yes  Nurse who received alert:Halina Andreas RN MD notified (1st page):  Cath lab notified, MD Mchalany made aware  Time of first page: 0933  MD notified (2nd page):  Time of second page:  Responding MD:    Time MD responded:

## 2016-09-06 ENCOUNTER — Encounter (HOSPITAL_COMMUNITY): Payer: Self-pay | Admitting: General Practice

## 2016-09-06 ENCOUNTER — Inpatient Hospital Stay (HOSPITAL_COMMUNITY): Payer: Medicare Other

## 2016-09-06 ENCOUNTER — Other Ambulatory Visit: Payer: Self-pay | Admitting: Cardiology

## 2016-09-06 ENCOUNTER — Telehealth: Payer: Self-pay | Admitting: Cardiovascular Disease

## 2016-09-06 DIAGNOSIS — I219 Acute myocardial infarction, unspecified: Secondary | ICD-10-CM

## 2016-09-06 DIAGNOSIS — N179 Acute kidney failure, unspecified: Secondary | ICD-10-CM

## 2016-09-06 LAB — CBC
HEMATOCRIT: 37.7 % — AB (ref 39.0–52.0)
HEMOGLOBIN: 12.8 g/dL — AB (ref 13.0–17.0)
MCH: 33.2 pg (ref 26.0–34.0)
MCHC: 34 g/dL (ref 30.0–36.0)
MCV: 97.9 fL (ref 78.0–100.0)
Platelets: 172 10*3/uL (ref 150–400)
RBC: 3.85 MIL/uL — ABNORMAL LOW (ref 4.22–5.81)
RDW: 12.5 % (ref 11.5–15.5)
WBC: 9.3 10*3/uL (ref 4.0–10.5)

## 2016-09-06 LAB — HEMOGLOBIN A1C
Hgb A1c MFr Bld: 6.5 % — ABNORMAL HIGH (ref 4.8–5.6)
Mean Plasma Glucose: 140 mg/dL

## 2016-09-06 LAB — BASIC METABOLIC PANEL
Anion gap: 8 (ref 5–15)
BUN: 24 mg/dL — AB (ref 6–20)
CHLORIDE: 103 mmol/L (ref 101–111)
CO2: 27 mmol/L (ref 22–32)
Calcium: 8.7 mg/dL — ABNORMAL LOW (ref 8.9–10.3)
Creatinine, Ser: 1.82 mg/dL — ABNORMAL HIGH (ref 0.61–1.24)
GFR, EST AFRICAN AMERICAN: 42 mL/min — AB (ref >60)
GFR, EST NON AFRICAN AMERICAN: 36 mL/min — AB (ref >60)
GLUCOSE: 170 mg/dL — AB (ref 65–99)
Potassium: 3.2 mmol/L — ABNORMAL LOW (ref 3.5–5.1)
Sodium: 138 mmol/L (ref 135–145)

## 2016-09-06 MED ORDER — POTASSIUM CHLORIDE CRYS ER 20 MEQ PO TBCR
40.0000 meq | EXTENDED_RELEASE_TABLET | Freq: Every day | ORAL | Status: DC
  Administered 2016-09-06 – 2016-09-07 (×2): 40 meq via ORAL
  Filled 2016-09-06 (×2): qty 2

## 2016-09-06 MED ORDER — METOPROLOL TARTRATE 12.5 MG HALF TABLET
37.5000 mg | ORAL_TABLET | Freq: Two times a day (BID) | ORAL | Status: DC
  Administered 2016-09-06 – 2016-09-07 (×3): 37.5 mg via ORAL
  Filled 2016-09-06 (×3): qty 1

## 2016-09-06 MED ORDER — PERFLUTREN LIPID MICROSPHERE
1.0000 mL | INTRAVENOUS | Status: AC | PRN
  Administered 2016-09-06: 14:00:00 3 mL via INTRAVENOUS
  Filled 2016-09-06: qty 10

## 2016-09-06 MED FILL — Nitroglycerin IV Soln 100 MCG/ML in D5W: INTRA_ARTERIAL | Qty: 10 | Status: AC

## 2016-09-06 NOTE — Progress Notes (Signed)
Progress Note  Patient Name: MARCELLUS PULLIAM Date of Encounter: 09/06/2016  Primary Cardiologist: Allyson Sabal  Subjective   Feels great. Walked briskly across the unit without angina, dyspnea or arrhythmia. HR 110 while walking. Frequent PVCs and brief NSVT seen yesterday have resolved. Marked increase in creatinine and hypokalemia are likely due to IV furosemide given yesterday.  Inpatient Medications    Scheduled Meds: . amLODipine  5 mg Oral Daily  . aspirin  81 mg Oral Daily  . atorvastatin  80 mg Oral Daily  . lisinopril  20 mg Oral Daily  . metoprolol  37.5 mg Oral BID  . potassium chloride  40 mEq Oral Daily  . sodium chloride flush  3 mL Intravenous Q12H  . sodium chloride flush  3 mL Intravenous Q12H  . ticagrelor  90 mg Oral BID   Continuous Infusions:  PRN Meds: sodium chloride, sodium chloride, acetaminophen, morphine injection, nitroGLYCERIN, ondansetron (ZOFRAN) IV, sodium chloride flush   Vital Signs    Vitals:   09/05/16 1914 09/05/16 2203 09/06/16 0236 09/06/16 0729  BP: 96/63 120/69 (!) 92/55 114/66  Pulse: 76 76 67 76  Resp: 17  (!) 21 (!) 21  Temp: 98.4 F (36.9 C)  97.7 F (36.5 C) 99.4 F (37.4 C)  TempSrc: Oral  Axillary Oral  SpO2: 93%  97% 91%  Weight:   122.2 kg (269 lb 8 oz)   Height:        Intake/Output Summary (Last 24 hours) at 09/06/16 0837 Last data filed at 09/06/16 0740  Gross per 24 hour  Intake            572.5 ml  Output             2500 ml  Net          -1927.5 ml   Filed Weights   09/04/16 2155 09/05/16 0149 09/06/16 0236  Weight: 117.9 kg (260 lb) 120 kg (264 lb 8.8 oz) 122.2 kg (269 lb 8 oz)    Telemetry    Frequent PVCs, bigeminy, brief NSVT yesterday, resolved this AM. - Personally Reviewed  ECG    NSR, QS V1-V2, anterior T wave inversion, prolonged QT - Personally Reviewed  Physical Exam  Comfortable, lying fully flat in bed GEN: No acute distress.   Neck: No JVD Cardiac: RRR, no murmurs, rubs, or gallops.   Respiratory: Clear to auscultation bilaterally. GI: Soft, nontender, non-distended  MS: No edema; No deformity. Neuro:  Nonfocal  Psych: Normal affect   Labs    Chemistry Recent Labs Lab 09/04/16 2226 09/05/16 0809 09/06/16 0241  NA 141 141 138  K 3.7 3.6 3.2*  CL 104 105 103  CO2 27 27 27   GLUCOSE 162* 123* 170*  BUN 16 15 24*  CREATININE 1.18 1.10 1.82*  CALCIUM 9.2 8.8* 8.7*  GFRNONAA >60 >60 36*  GFRAA >60 >60 42*  ANIONGAP 10 9 8      Hematology Recent Labs Lab 09/04/16 2226 09/05/16 0809 09/06/16 0241  WBC 12.4* 9.8 9.3  RBC 4.05* 3.95* 3.85*  HGB 13.6 13.1 12.8*  HCT 39.3 38.3* 37.7*  MCV 97.0 97.0 97.9  MCH 33.6 33.2 33.2  MCHC 34.6 34.2 34.0  RDW 12.3 12.2 12.5  PLT 188 182 172    Cardiac Enzymes Recent Labs Lab 09/05/16 0134 09/05/16 0205 09/05/16 0809  TROPONINI 0.87* 1.23* 12.64*    Recent Labs Lab 09/04/16 2231 09/05/16 0142  TROPIPOC 0.12* 0.62*     BNPNo results for  input(s): BNP, PROBNP in the last 168 hours.   DDimer No results for input(s): DDIMER in the last 168 hours.   Radiology    Dg Chest 2 View  Result Date: 09/04/2016 CLINICAL DATA:  Central chest pain beginning earlier today. EXAM: CHEST  2 VIEW COMPARISON:  09/15/2015 FINDINGS: Cardiac silhouette is top-normal in size. There is no aortic aneurysm. No pulmonary consolidations noted. There is left basilar scarring. Rounded density overlying the anterior fourth rib may represent a bone island or calcified granuloma. This is unchanged in appearance. Similar finding in the left upper lobe with more oblong density overlying the posterior left ninth rib which is more likely a bone island or summation of vascular and rib shadows. IMPRESSION: No active cardiopulmonary disease.  Scarring at the left lung base. Electronically Signed   By: Tollie Eth M.D.   On: 09/04/2016 22:23    Cardiac Studies   CATH 09/05/2016              Left Heart   Left Ventricle The left  ventricle is severely dilated. There is severe left ventricular systolic dysfunction. LV end diastolic pressure is moderately elevated. The left ventricular ejection fraction is less than 25% by visual estimate. There are LV function abnormalities due to segmental dysfunction. There is no evidence of mitral regurgitation.    Coronary Diagrams   Diagnostic Diagram       Post-Intervention Diagram       Implants     Permanent Stent  Stent Synergy Des 4x16 - [id number] - Implanted    Inventory item: Stent Synergy Des 4x16 Model/Cat number: [id number]  Manufacturer: [employer] Lot number: [id number]  Device identifier: [id number] Device identifier type: GS1       Patient Profile     71 y.o. male with severe LV dysfunction, but without clinical CHF following anterio wall NSTEMI and DES to LAD. Degree of LV dysfunction is disproportionate to the increase in cardiac enzymes.  Assessment & Plan    1. NSTEMI: very large area of myocardium in jeopardy, but small amount of enzyme release and Q waves limited to septal leads on ECG suggest extensive myocardial stunning.  2. LV systolic dysfunction: Has excellent clinical response and exercise capacity. Already on ACEi and beta blocker. No clinical CHF. Echo today 3. NSVT: has subsided and probably related to hypokalemia. Recent clinical trial on LifeVest is discouraging, doubtful there is any true benefit. Will try to increase the beta blocker dose. 4. AKI: occurred very early after angiogram, less likely to be contrast nephrotoxicity, but will need early repeat labs on Monday. Continue ACEi for now. Recheck labs in AM.  5. HTN: hold thiazide.   Signed, [name], MD  09/06/2016, 8:37 AM

## 2016-09-06 NOTE — Progress Notes (Signed)
  Echocardiogram 2D Echocardiogram has been performed.  Arvil Chaco 09/06/2016, 2:17 PM

## 2016-09-06 NOTE — Telephone Encounter (Signed)
TCM Phone Call- Appt is 09/16/16 at 9am w/ Theodore Demark.. Thanks

## 2016-09-06 NOTE — Progress Notes (Signed)
CARDIAC REHAB PHASE I   PRE:  Rate/Rhythm: 73 SR PACs  BP:  Supine: 111/67  Sitting:   Standing:    SaO2: 97%RA  MODE:  Ambulation: 800 ft   POST:  Rate/Rhythm: 105 ST  BP:  Supine:   Sitting: 126/78  Standing:    SaO2: 97%RA 0810-0910 Pt walked 800 ft with steady gait. No CP. Tolerated well. Pt stated wife has brilinta card. She is to bring with her when she visits. Stressed importance of brilinta with stent. Pt has A1C of 6.5. Discussed watching carbs. With low EF, pt given CHF booklet and reviewed zones. Stressed daily weights and 2000 mg sodium restriction. Pt stated he has nausea and low BP when he has been exposed to NTG, so he knows to call 911 if CP. Has attended [facility] 2 in [place] before, will refer there for follow up.   Luetta Nutting, RN BSN  09/06/2016 9:06 AM

## 2016-09-06 NOTE — Progress Notes (Signed)
Rounding done. Pt. Sleeping.

## 2016-09-07 DIAGNOSIS — N179 Acute kidney failure, unspecified: Secondary | ICD-10-CM | POA: Diagnosis not present

## 2016-09-07 DIAGNOSIS — I255 Ischemic cardiomyopathy: Secondary | ICD-10-CM

## 2016-09-07 DIAGNOSIS — I236 Thrombosis of atrium, auricular appendage, and ventricle as current complications following acute myocardial infarction: Secondary | ICD-10-CM | POA: Diagnosis not present

## 2016-09-07 DIAGNOSIS — I200 Unstable angina: Secondary | ICD-10-CM

## 2016-09-07 LAB — ECHOCARDIOGRAM COMPLETE
Height: 78 in
Weight: 4312 oz

## 2016-09-07 LAB — BASIC METABOLIC PANEL
Anion gap: 8 (ref 5–15)
BUN: 20 mg/dL (ref 6–20)
CALCIUM: 8.8 mg/dL — AB (ref 8.9–10.3)
CO2: 28 mmol/L (ref 22–32)
CREATININE: 1.24 mg/dL (ref 0.61–1.24)
Chloride: 103 mmol/L (ref 101–111)
GFR calc Af Amer: >60 mL/min (ref >60)
GFR, EST NON AFRICAN AMERICAN: 57 mL/min — AB (ref >60)
GLUCOSE: 129 mg/dL — AB (ref 65–99)
POTASSIUM: 3.6 mmol/L (ref 3.5–5.1)
SODIUM: 139 mmol/L (ref 135–145)

## 2016-09-07 MED ORDER — NITROGLYCERIN 0.4 MG SL SUBL: 0.4000 mg | SUBLINGUAL_TABLET | SUBLINGUAL | 2 refills | Status: DC | PRN

## 2016-09-07 MED ORDER — ACETAMINOPHEN 325 MG PO TABS: 650.0000 mg | ORAL_TABLET | ORAL | Status: DC | PRN

## 2016-09-07 MED ORDER — TICAGRELOR 90 MG PO TABS: 90.0000 mg | ORAL_TABLET | Freq: Two times a day (BID) | ORAL | 3 refills | Status: DC

## 2016-09-07 NOTE — Discharge Summary (Signed)
Discharge Summary    Patient ID: Kevin Patel,  MRN: [id number], DOB/AGE: [birth year] 71 y.o.  Admit date: 09/04/2016 Discharge date: 09/07/2016  Primary Care Provider: Feliciana Rossetti Primary Cardiologist: Dr Allyson Sabal  Discharge Diagnoses    Principal Problem:   Unstable angina  Active Problems:   Cardiomyopathy, ischemic   Essential hypertension   Dyslipidemia   NSTEMI    Acute kidney injury      Allergies No Known Allergies  Diagnostic Studies/Procedures    Cath/PCI 09/06/16 Echo 09/06/16 _____________   History of Present Illness     71 y/o with hx of CAD adm 09/04/16 with [place].  Hospital Course     71 y.o. male, s/p remote LAD PCI/ stent in 1998, remote LVT-"on Coumadin for 13 years". Coumadin was stopped 2013 after an echo with Definty revealed no clot. Other problems include HTN, and HLD. Pt was admitted admitted 09/04/16 with [place] and ruled in for NSTEMI. Cath done 09/05/16 revealed a new pLAD 99% stenosis (prior pLAD stent was patent). He received a DES to the LAD with good result. He has residual 90% very distal LAD stenosis. The pt's EF was depressed at cath 20-25%.   An Echo was done 09/06/16 with was read as "large apical thrombus" large apical thrombus with an EF of 20-25%. Dr Anne Fu and Dr Royann Shivers reviewed the echo and after careful review of the Definity images they felt there was no clot present. The pt was discharged 09/07/16 and will f/u in the office.   The pt did have AKI post PCI with a bump in his SCr to 1.8 post PCI. This improved to 1.2 post PCI. He also had NSVT pre PCI. This was in the setting of hypokalemia. LifeVest was considered but Dr Royann Shivers ultimately felt this was not indicated. The pt's rhythm remained stable post PCI. He'll need a BMP at f/u to follow up his SCr and K+.  _____________  Discharge Vitals Blood pressure 139/73, pulse 75, temperature 98.9 F (37.2 C), temperature source Oral, resp. rate (!) 23, height 6\' 6"  (1.981 m), weight 275  lb (124.7 kg), SpO2 95 %.  Filed Weights   09/05/16 0149 09/06/16 0236 09/07/16 0256  Weight: 264 lb 8.8 oz (120 kg) 269 lb 8 oz (122.2 kg) 275 lb (124.7 kg)    Labs & Radiologic Studies    CBC  Recent Labs  09/05/16 0809 09/06/16 0241  WBC 9.8 9.3  HGB 13.1 12.8*  HCT 38.3* 37.7*  MCV 97.0 97.9  PLT 182 172   Basic Metabolic Panel  Recent Labs  09/05/16 1911 09/06/16 0241 09/07/16 0249  NA  --  138 139  K  --  3.2* 3.6  CL  --  103 103  CO2  --  27 28  GLUCOSE  --  170* 129*  BUN  --  24* 20  CREATININE  --  1.82* 1.24  CALCIUM  --  8.7* 8.8*  MG 1.9  --   --    Liver Function Tests No results for input(s): AST, ALT, ALKPHOS, BILITOT, PROT, ALBUMIN in the last 72 hours. No results for input(s): LIPASE, AMYLASE in the last 72 hours. Cardiac Enzymes  Recent Labs  09/05/16 0134 09/05/16 0205 09/05/16 0809  TROPONINI 0.87* 1.23* 12.64*   BNP Invalid input(s): POCBNP D-Dimer No results for input(s): DDIMER in the last 72 hours. Hemoglobin A1C  Recent Labs  09/05/16 0205  HGBA1C 6.5*   Fasting Lipid Panel  Recent Labs  09/05/16  0205  CHOL 119  HDL 46  LDLCALC 66  TRIG 33  CHOLHDL 2.6   Thyroid Function Tests No results for input(s): TSH, T4TOTAL, T3FREE, THYROIDAB in the last 72 hours.  Invalid input(s): FREET3 _____________  Dg Chest 2 View  Result Date: 09/04/2016 CLINICAL DATA:  Central chest pain beginning earlier today. EXAM: CHEST  2 VIEW COMPARISON:  09/15/2015 FINDINGS: Cardiac silhouette is top-normal in size. There is no aortic aneurysm. No pulmonary consolidations noted. There is left basilar scarring. Rounded density overlying the anterior fourth rib may represent a bone island or calcified granuloma. This is unchanged in appearance. Similar finding in the left upper lobe with more oblong density overlying the posterior left ninth rib which is more likely a bone island or summation of vascular and rib shadows. IMPRESSION: No active  cardiopulmonary disease.  Scarring at the left lung base. Electronically Signed   By: Tollie Eth M.D.   On: 09/04/2016 22:23   Disposition   Pt is being discharged home today in good condition.  Follow-up Plans & Appointments    Follow-up Information    Barrett, Bjorn Loser, PA-C Follow up on 09/16/2016.   Specialties:  Cardiology, Radiology Why:  9 am Contact information: [address] [place] [address] [phone]          Discharge Instructions    Amb Referral to Cardiac Rehabilitation    Complete by:  As directed    Referring to [place] Phase 2   Diagnosis:   NSTEMI Coronary Stents        Discharge Medications   Current Discharge Medication List    START taking these medications   Details  acetaminophen (TYLENOL) 325 MG tablet Take 2 tablets (650 mg total) by mouth every 4 (four) hours as needed for headache or mild pain.    nitroGLYCERIN (NITROSTAT) 0.4 MG SL tablet Place 1 tablet (0.4 mg total) under the tongue every 5 (five) minutes x 3 doses as needed for chest pain. Qty: 25 tablet, Refills: 2    ticagrelor (BRILINTA) 90 MG TABS tablet Take 1 tablet (90 mg total) by mouth 2 (two) times daily. Qty: 180 tablet, Refills: 3      CONTINUE these medications which have NOT CHANGED   Details  amLODipine (NORVASC) 5 MG tablet TAKE ONE TABLET BY MOUTH ONCE DAILY Qty: 90 tablet, Refills: 3    aspirin 81 MG tablet Take 81 mg by mouth every morning.     atorvastatin (LIPITOR) 80 MG tablet TAKE ONE TABLET BY MOUTH ONCE DAILY Qty: 90 tablet, Refills: 3    Flaxseed, Linseed, (FLAXSEED OIL PO) Take 1 tablet by mouth daily.     GLUCOSAMINE HCL PO Take 1 tablet by mouth daily.    lisinopril-hydrochlorothiazide (PRINZIDE,ZESTORETIC) 20-12.5 MG tablet TAKE ONE TABLET BY MOUTH TWICE DAILY Qty: 180 tablet, Refills: 3    metoprolol (LOPRESSOR) 50 MG tablet TAKE ONE-HALF TABLET BY MOUTH TWICE DAILY Qty: 90 tablet, Refills: 3    Multiple Vitamin  (MULTIVITAMIN) capsule Take 1 capsule by mouth daily.    vitamin C (ASCORBIC ACID) 500 MG tablet Take 500 mg by mouth daily.    vitamin E 400 UNIT capsule Take 400 Units by mouth daily.      STOP taking these medications     meloxicam (MOBIC) 15 MG tablet          Aspirin prescribed at discharge?  Yes High Intensity Statin Prescribed? (Lipitor 40-80mg  or Crestor 20-40mg ): Yes Beta Blocker Prescribed? Yes  For EF <40%, was ACEI/ARB Prescribed? Yes ADP Receptor Inhibitor Prescribed? (i.e. Plavix etc.-Includes Medically Managed Patients): Yes For EF <40%, Aldosterone Inhibitor Prescribed? No: Not now- transient renal insufficiency  Was EF assessed during THIS hospitalization? Yes Was Cardiac Rehab II ordered? (Included Medically managed Patients): Yes   Outstanding Labs/Studies     Duration of Discharge Encounter   Greater than 30 minutes including physician time.  Jolene Provost PA 09/07/2016, 9:22 AM  Personally seen and examined. Agree with above.  After careful review of ECHO with Definity contrast images (both myself and Dr. Royann Shivers) as well as LV angiogram, there does NOT appear to be apical thrombus present. Therefore, since he ambulated well, and Creat improved, and heart RRR, lungs CTAB, AAO x 3 and no bleeding from cath site, he may be DC'd home.   Donato Schultz, MD

## 2016-09-07 NOTE — Discharge Instructions (Addendum)
OUR POTASSIUM HAS BEEN LOW THIS ADMISSION AND WILL BE RECHECKED AT YOUR FOLLOW UP APPOINTMENT.  UNTIL THEN, TRY TO EAT ONE BANANA OR ORANGE DAILY.      Coronary Angiogram With Stent, Care After This sheet gives you information about how to care for yourself after your procedure. Your health care provider may also give you more specific instructions. If you have problems or questions, contact your health care provider. What can I expect after the procedure? After your procedure, it is common to have:  Bruising in the area where a small, thin tube (catheter) was inserted. This usually fades within 1-2 weeks.  Blood collecting in the tissue (hematoma) that may be painful to the touch. It should usually decrease in size and tenderness within 1-2 weeks. Follow these instructions at home: Insertion area care   Do not take baths, swim, or use a hot tub until your health care provider approves.  You may shower 24-48 hours after the procedure or as directed by your health care provider.  Follow instructions from your health care provider about how to take care of your incision. Make sure you:  Wash your hands with soap and water before you change your bandage (dressing). If soap and water are not available, use hand sanitizer.  Change your dressing as told by your health care provider.  Leave stitches (sutures), skin glue, or adhesive strips in place. These skin closures may need to stay in place for 2 weeks or longer. If adhesive strip edges start to loosen and curl up, you may trim the loose edges. Do not remove adhesive strips completely unless your health care provider tells you to do that.  Remove the bandage (dressing) and gently wash the catheter insertion site with plain soap and water.  Pat the area dry with a clean towel. Do not rub the area, because that may cause bleeding.  Do not apply powder or lotion to the incision area.  Check your incision area every day for signs of  infection. Check for:  More redness, swelling, or pain.  More fluid or blood.  Warmth.  Pus or a bad smell. Activity   Do not drive for 24 hours if you were given a medicine to help you relax (sedative).  Do not lift anything that is heavier than 10 lb (4.5 kg) for 5 days after your procedure or as directed by your health care provider.  Ask your health care provider when it is okay for you:  To return to work or school.  To resume usual physical activities or sports.  To resume sexual activity. Eating and drinking   Eat a heart-healthy diet. This should include plenty of fresh fruits and vegetables.  Avoid the following types of food:  Food that is high in salt.  Canned or highly processed food.  Food that is high in saturated fat or sugar.  Fried food.  Limit alcohol intake to no more than 1 drink a day for non-pregnant women and 2 drinks a day for men. One drink equals 12 oz of beer, 5 oz of wine, or 1 oz of hard liquor. Lifestyle   Do not use any products that contain nicotine or tobacco, such as cigarettes and e-cigarettes. If you need help quitting, ask your health care provider.  Take steps to manage and control your weight.  Get regular exercise.  Manage your blood pressure.  Manage other health problems, such as diabetes. General instructions   Take over-the-counter and prescription medicines only  as told by your health care provider. Blood thinners may be prescribed after your procedure to improve blood flow through the stent.  If you need an MRI after your heart stent has been placed, be sure to tell the health care provider who orders the MRI that you have a heart stent.  Keep all follow-up visits as directed by your health care provider. This is important. Contact a health care provider if:  You have a fever.  You have chills.  You have increased bleeding from the catheter insertion area. Hold pressure on the area. Get help right away  if:  You develop chest pain or shortness of breath.  You feel faint or you pass out.  You have unusual pain at the catheter insertion area.  You have redness, warmth, or swelling at the catheter insertion area.  You have drainage (other than a small amount of blood on the dressing) from the catheter insertion area.  The catheter insertion area is bleeding, and the bleeding does not stop after 30 minutes of holding steady pressure on the area.  You develop bleeding from any other place, such as from your rectum. There may be bright red blood in your urine or stool, or it may appear as black, tarry stool. This information is not intended to replace advice given to you by your health care provider. Make sure you discuss any questions you have with your health care provider. Document Released: 12/14/2004 Document Revised: 02/22/2016 Document Reviewed: 02/22/2016 Elsevier Interactive Patient Education  2017 [employer]. Call [facility] at [phone] if any bleeding, swelling or drainage at cath site.  May shower, no tub baths for 48 hours for groin sticks. No lifting over 5 pounds for 3 days.  No Driving for 5 days.    Heart Healthy low salt Diet  Your pump action of your heart is weaker currently, it would be helpful for you to weigh daily and call if wt increases by 3 pounds in a day or 5 lbs in a week.  Watch the salt try to decrease as much as possible.

## 2016-09-07 NOTE — Progress Notes (Signed)
CARDIAC REHAB PHASE I   PRE:  Rate/Rhythm :85  BP:  Sitting: 121/90     SaO2: 95%  MODE:  Ambulation: 900 ft   POST:  Rate/Rhythm: 89  BP:  Sitting: 130/73     SaO2: 95%  8:03am-8:26am Patient ambulated independently with no complaints. Encouraged patient to walk again today and tomorrow since Cardiac Rehab is not here on Sundays. Patient returned to bed.   Kevin Lister Leocadio Heal, MS 09/07/2016 8:24 AM

## 2016-09-07 NOTE — Progress Notes (Signed)
Progress Note  Patient Name: Kevin Patel Date of Encounter: 09/07/2016  Primary Cardiologist: Dr Allyson Sabal  Subjective   No chest pain  Inpatient Medications    Scheduled Meds: . amLODipine  5 mg Oral Daily  . aspirin  81 mg Oral Daily  . atorvastatin  80 mg Oral Daily  . lisinopril  20 mg Oral Daily  . metoprolol  37.5 mg Oral BID  . potassium chloride SA  40 mEq Oral Daily  . sodium chloride flush  3 mL Intravenous Q12H  . sodium chloride flush  3 mL Intravenous Q12H  . ticagrelor  90 mg Oral BID   Continuous Infusions:  PRN Meds: sodium chloride, sodium chloride, acetaminophen, morphine injection, nitroGLYCERIN, ondansetron (ZOFRAN) IV, sodium chloride flush   Vital Signs    Vitals:   09/06/16 1555 09/06/16 1913 09/06/16 2230 09/07/16 0256  BP: 113/79 112/84 131/88 117/74  Pulse: 63 69 69 66  Resp: 16 14 19 15   Temp:  98 F (36.7 C)  97 F (36.1 C)  TempSrc:  Oral  Axillary  SpO2:  99% 97% 95%  Weight:    275 lb (124.7 kg)  Height:        Intake/Output Summary (Last 24 hours) at 09/07/16 0738 Last data filed at 09/06/16 2230  Gross per 24 hour  Intake             1320 ml  Output              300 ml  Net             1020 ml   Filed Weights   09/05/16 0149 09/06/16 0236 09/07/16 0256  Weight: 264 lb 8.8 oz (120 kg) 269 lb 8 oz (122.2 kg) 275 lb (124.7 kg)    Telemetry    NSR, PVCs- no NSVT last 24 hrs- Personally Reviewed  ECG    NSR, Lat TWI, PVCs- Personally Reviewed  Physical Exam   GEN: No acute distress.   Neck: No JVD Cardiac: RRR, no murmurs, rubs, or gallops.  Respiratory: Clear to auscultation bilaterally. GI: Soft, nontender, non-distended  MS: No edema; No deformity. Neuro:  Nonfocal  Psych: Normal affect   Labs    Chemistry Recent Labs Lab 09/05/16 0809 09/06/16 0241 09/07/16 0249  NA 141 138 139  K 3.6 3.2* 3.6  CL 105 103 103  CO2 27 27 28   GLUCOSE 123* 170* 129*  BUN 15 24* 20  CREATININE 1.10 1.82* 1.24    CALCIUM 8.8* 8.7* 8.8*  GFRNONAA >60 36* 57*  GFRAA >60 42* >60  ANIONGAP 9 8 8      Hematology Recent Labs Lab 09/04/16 2226 09/05/16 0809 09/06/16 0241  WBC 12.4* 9.8 9.3  RBC 4.05* 3.95* 3.85*  HGB 13.6 13.1 12.8*  HCT 39.3 38.3* 37.7*  MCV 97.0 97.0 97.9  MCH 33.6 33.2 33.2  MCHC 34.6 34.2 34.0  RDW 12.3 12.2 12.5  PLT 188 182 172    Cardiac Enzymes Recent Labs Lab 09/05/16 0134 09/05/16 0205 09/05/16 0809  TROPONINI 0.87* 1.23* 12.64*    Recent Labs Lab 09/04/16 2231 09/05/16 0142  TROPIPOC 0.12* 0.62*       Radiology    09/04/16 CXR- CHEST  2 VIEW  COMPARISON:  09/15/2015  FINDINGS: Cardiac silhouette is top-normal in size. There is no aortic aneurysm. No pulmonary consolidations noted. There is left basilar scarring. Rounded density overlying the anterior fourth rib may represent a bone island or calcified granuloma.  This is unchanged in appearance. Similar finding in the left upper lobe with more oblong density overlying the posterior left ninth rib which is more likely  a bone island or summation of vascular and rib shadows.  IMPRESSION: No active cardiopulmonary disease.  Scarring at the left lung base.   Cardiac Studies   Echo 09/06/16- Study Conclusions  - Left ventricle: The cavity size was normal. There was moderate   concentric hypertrophy. Systolic function was severely reduced.   The estimated ejection fraction was in the range of 20% to 25%.   Doppler parameters are consistent with abnormal left ventricular   relaxation (grade 1 diastolic dysfunction). Doppler parameters   are consistent with elevated ventricular end-diastolic filling   pressure. - Aortic valve: There was no regurgitation. - Aortic root: The aortic root was normal in size. - Mitral valve: There was no regurgitation. - Left atrium: The atrium was mildly dilated. - Right ventricle: Systolic function was normal. - Right atrium: The atrium was normal in  size. - Tricuspid valve: There was no regurgitation. - Pulmonary arteries: Systolic pressure was within the normal   range. - Pericardium, extracardiac: There was no pericardial effusion.  Impressions:  - LVEF is severely decreased estimated at 20-25%. There is diffuse   hypokinesis and aneurysmal dilatation of all of the apical   segments with a large thrombus seen in the LV apex measuring 31 x   30 mm.   Patient Profile     71 y.o. 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 Cath 09/05/16-new pLAD 99% stenosis (prior pLAD stent was patent). He received a DES to the LAD. He has residual 90% distal LAD stenosis. EF was depressed at cath 20-25%. Echo 09/06/16- large apical thrombus, EF 20-25%.   Assessment & Plan    1.- NSTEMI- admitted 09/04/16, peak Troponin 12.6.   2.- CAD- s/p remote pLAD PCI 1998, new pLAD 99% at cath 09/05/16, treated with DES. Prior pLAD stent patent and residual very distal 90% LAD.  3. NSVT- pre PCI in setting of hypokalemia no LifeVest per Dr Royann Shivers  4. ? LV thrombus- he has a history of remote thrombus and was on Coumadin for years, stopped in 2013 after an echo with Definity did not show thrombus. 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   5.- Cardiomyopathy- Dr Royann Shivers felt this was somewhat out of proportion to his CAD  6.- AKI- SCr bumped to 1.8 s/p cath/PCI- now down to 1.2. Will continue ACE but hold diuretic for now.   7. HLD- on high dose statin Rx- LDL 66  Plan- Discussed with Dr Anne Fu and Dr Royann Shivers- Echo reviewed- no LVT by Definity.   Signed, Corine Shelter, PA-C  09/07/2016, 7:38 AM   Personally seen and examined. Agree with above.  After careful review of ECHO with Definity contrast images (both myself and Dr. Royann Shivers) as well as LV angiogram, there does NOT appear to be apical thrombus present. Therefore, since he ambulated well, and Creat  improved, and heart RRR, lungs CTAB, AAO x 3 and no bleeding from cath site, he may be DC'd home.   Donato Schultz, MD

## 2016-09-09 ENCOUNTER — Encounter (HOSPITAL_COMMUNITY): Payer: Self-pay | Admitting: Cardiology

## 2016-09-09 DIAGNOSIS — E119 Type 2 diabetes mellitus without complications: Secondary | ICD-10-CM

## 2016-09-09 DIAGNOSIS — I10 Essential (primary) hypertension: Secondary | ICD-10-CM

## 2016-09-09 HISTORY — DX: Essential (primary) hypertension: E11.9

## 2016-09-09 HISTORY — DX: Essential (primary) hypertension: I10

## 2016-09-09 NOTE — Telephone Encounter (Signed)
Patient contacted regarding discharge from [facility] on 09/07/16.    Patient understands to follow up with provider Theodore Demark PA on 09/16/16 at 9:00AM at [facility] office.  Patient understands discharge instructions? yes  Patient understands medications and regiment? yes  Patient understands to bring all medications to this visit? yes

## 2016-09-10 DIAGNOSIS — N1831 Chronic kidney disease, stage 3a: Secondary | ICD-10-CM | POA: Insufficient documentation

## 2016-09-10 DIAGNOSIS — N183 Chronic kidney disease, stage 3 unspecified: Secondary | ICD-10-CM | POA: Insufficient documentation

## 2016-09-10 DIAGNOSIS — I252 Old myocardial infarction: Secondary | ICD-10-CM | POA: Insufficient documentation

## 2016-09-11 DIAGNOSIS — E039 Hypothyroidism, unspecified: Secondary | ICD-10-CM | POA: Insufficient documentation

## 2016-09-16 ENCOUNTER — Encounter: Payer: Self-pay | Admitting: Physician Assistant

## 2016-09-16 ENCOUNTER — Ambulatory Visit (INDEPENDENT_AMBULATORY_CARE_PROVIDER_SITE_OTHER): Payer: Medicare Other | Admitting: Physician Assistant

## 2016-09-16 VITALS — BP 116/67 | HR 82 | Ht 76.0 in | Wt 263.8 lb

## 2016-09-16 DIAGNOSIS — E876 Hypokalemia: Secondary | ICD-10-CM | POA: Diagnosis not present

## 2016-09-16 DIAGNOSIS — I255 Ischemic cardiomyopathy: Secondary | ICD-10-CM | POA: Diagnosis not present

## 2016-09-16 DIAGNOSIS — N179 Acute kidney failure, unspecified: Secondary | ICD-10-CM | POA: Diagnosis not present

## 2016-09-16 DIAGNOSIS — I10 Essential (primary) hypertension: Secondary | ICD-10-CM

## 2016-09-16 DIAGNOSIS — E782 Mixed hyperlipidemia: Secondary | ICD-10-CM

## 2016-09-16 DIAGNOSIS — I214 Non-ST elevation (NSTEMI) myocardial infarction: Secondary | ICD-10-CM

## 2016-09-16 LAB — COMPREHENSIVE METABOLIC PANEL
ALBUMIN: 4.2 g/dL (ref 3.6–5.1)
ALT: 17 U/L (ref 9–46)
AST: 16 U/L (ref 10–35)
Alkaline Phosphatase: 56 U/L (ref 40–115)
BUN: 23 mg/dL (ref 7–25)
CALCIUM: 9.7 mg/dL (ref 8.6–10.3)
CHLORIDE: 103 mmol/L (ref 98–110)
CO2: 28 mmol/L (ref 20–31)
CREATININE: 1.26 mg/dL — AB (ref 0.70–1.18)
Glucose, Bld: 147 mg/dL — ABNORMAL HIGH (ref 65–99)
Potassium: 4.1 mmol/L (ref 3.5–5.3)
SODIUM: 138 mmol/L (ref 135–146)
Total Bilirubin: 0.8 mg/dL (ref 0.2–1.2)
Total Protein: 7 g/dL (ref 6.1–8.1)

## 2016-09-16 LAB — BASIC METABOLIC PANEL
BUN: 23 mg/dL (ref 7–25)
CO2: 28 mmol/L (ref 20–31)
Calcium: 9.7 mg/dL (ref 8.6–10.3)
Chloride: 103 mmol/L (ref 98–110)
Creat: 1.26 mg/dL — ABNORMAL HIGH (ref 0.70–1.18)
GLUCOSE: 147 mg/dL — AB (ref 65–99)
POTASSIUM: 4.1 mmol/L (ref 3.5–5.3)
Sodium: 138 mmol/L (ref 135–146)

## 2016-09-16 LAB — HEPATIC FUNCTION PANEL
ALBUMIN: 4.2 g/dL (ref 3.6–5.1)
ALK PHOS: 56 U/L (ref 40–115)
ALT: 17 U/L (ref 9–46)
AST: 16 U/L (ref 10–35)
BILIRUBIN DIRECT: 0.2 mg/dL (ref <=0.2)
BILIRUBIN TOTAL: 0.8 mg/dL (ref 0.2–1.2)
Indirect Bilirubin: 0.6 mg/dL (ref 0.2–1.2)
Total Protein: 7 g/dL (ref 6.1–8.1)

## 2016-09-16 LAB — LIPID PANEL
CHOL/HDL RATIO: 2.8 ratio (ref <5.0)
Cholesterol: 103 mg/dL (ref <200)
HDL: 37 mg/dL — AB (ref >40)
LDL Cholesterol: 44 mg/dL (ref <100)
Triglycerides: 110 mg/dL (ref <150)
VLDL: 22 mg/dL (ref <30)

## 2016-09-16 NOTE — Addendum Note (Signed)
Addended by: Alyson Ingles on: 09/16/2016 09:53 AM   Modules accepted: Orders

## 2016-09-16 NOTE — Progress Notes (Signed)
Cardiology Office Note   Date:  09/16/2016   ID:  Zamere, Pasternak 1945/06/14, MRN [id number]  PCP:  Feliciana Rossetti, MD  Cardiologist:  Dr William Hamburger, PA-C   Chief Complaint  Patient presents with  . Follow-up    non Stemi /stents    History of Present Illness: Kevin Patel is a 71 y.o. male with a history of LAD PCI/ stent in 1998, previous LV thrombus with Coumadin DC'd 2013 After repeat echo showed no clot), DM, HLD, HTN, ICM   Admit 03/28-3/31/2018, with a non-STEMI, DES to the LAD, distal LAD 90% with medical therapy recommended, EF 20-25 percent. An echo initially was read as large apical thrombus but the echo was reviewed by Dr. Anne Fu and Dr. Royann Shivers and it was felt there was no thrombus. Creatinine increased to 1.8 post PCI but improved by discharge, recheck at office visit.  Kevin Patel presents for post-hospital follow up  He has not had any chest pain or SOB. He wants to increase his activity, plans to attend Cardiac Rehab. He wants to start back bowling, has a tournament coming up in June.   He was doing some things around the house the other day and felt a little tired. However, he did not have any dyspnea on exertion. He has not had any lower extremity edema, denies orthopnea and PND.  He is tolerating the medications well. He is compliant with his medications and has not missed any doses of Brilinta. He does not know how much the Brilinta will cost after he finishes cysts 30 days for free, but thinks his insurance will cover most of it.  He wonders if the blockage in his distal LAD is going to cause any problems.   Past Medical History:  Diagnosis Date  . Coronary artery disease   . Diabetes mellitus with coincident hypertension (HCC) 09/09/2016  . History of kidney stones   . HTN (hypertension)   . Hyperlipidemia   . Ischemic cardiomyopathy   . MI (myocardial infarction) 1998   myoview 09/25/2006-no ischemia, EF 46%  . [place] thrombus of heart     on coumadin until follow up echo with contrast in Sept 13, 2012 which showed no thrombus and coumadin was d/c    Past Surgical History:  Procedure Laterality Date  . CORONARY ANGIOPLASTY  1998   PCI  . CORONARY STENT INTERVENTION N/A 09/05/2016   Procedure: Coronary Stent Intervention;  Surgeon: [name], MD;  Location[facility] INVASIVE CV LAB;  Service: Cardiovascular;  Laterality: N/A;  . INTRAVASCULAR ULTRASOUND/IVUS N/A 09/05/2016   Procedure: Intravascular Ultrasound/IVUS;  Surgeon: [name], MD;  Location: [facility] INVASIVE CV LAB;  Service: Cardiovascular;  Laterality: N/A;  . LEFT HEART CATH AND CORONARY ANGIOGRAPHY N/A 09/05/2016   Procedure: Left Heart Cath and Coronary Angiography;  Surgeon: [name], MD;  Location: [facility] INVASIVE CV LAB;  Service: Cardiovascular;  Laterality: N/A;    Medication Sig  . acetaminophen (TYLENOL) 325 MG tablet Take 2 tablets (650 mg total) by mouth every 4 (four) hours as needed for headache or mild pain.  Marland Kitchen amLODipine (NORVASC) 5 MG tablet TAKE ONE TABLET BY MOUTH ONCE DAILY  . aspirin 81 MG tablet Take 81 mg by mouth every morning.   Marland Kitchen atorvastatin (LIPITOR) 80 MG tablet TAKE ONE TABLET BY MOUTH ONCE DAILY  . Flaxseed, Linseed, (FLAXSEED OIL PO) Take 1 tablet by mouth daily.   Marland Kitchen GLUCOSAMINE HCL PO Take 1 tablet  by mouth daily.  Marland Kitchen lisinopril-hydrochlorothiazide (PRINZIDE,ZESTORETIC) 20-12.5 MG tablet TAKE ONE TABLET BY MOUTH TWICE DAILY  . metoprolol (LOPRESSOR) 50 MG tablet TAKE ONE-HALF TABLET BY MOUTH TWICE DAILY  . Multiple Vitamin (MULTIVITAMIN) capsule Take 1 capsule by mouth daily.  . nitroGLYCERIN (NITROSTAT) 0.4 MG SL tablet Place 1 tablet (0.4 mg total) under the tongue every 5 (five) minutes x 3 doses as needed for chest pain.  . ticagrelor (BRILINTA) 90 MG TABS tablet Take 1 tablet (90 mg total) by mouth 2 (two) times daily.  . vitamin C (ASCORBIC ACID) 500 MG tablet Take 500 mg by mouth daily.  . vitamin E  400 UNIT capsule Take 400 Units by mouth daily.   No current facility-administered medications for this visit.     Allergies:   Patient has no known allergies.    Social History:  The patient  reports that he quit smoking about 23 years ago. He has never used smokeless tobacco. He reports that he does not drink alcohol or use drugs.   Family History:  The patient's family history includes Emphysema in his father.    ROS:  Please see the history of present illness. All other systems are reviewed and negative.    PHYSICAL EXAM: VS:  BP 116/67   Pulse 82   Ht 6\' 4"  (1.93 m)   Wt 263 lb 12.8 oz (119.7 kg)   BMI 32.11 kg/m  , BMI Body mass index is 32.11 kg/m. GEN: Well nourished, well developed, male in no acute distress  HEENT: normal for age  Neck: no JVD, no carotid bruit, no masses Cardiac: RRR; no murmur, no rubs, or gallops Respiratory:  clear to auscultation bilaterally, normal work of breathing GI: soft, nontender, nondistended, + BS MS: no deformity or atrophy; no edema; distal pulses are 2+ in all 4 extremities. Right radial cath site is well-healed with no ecchymosis or hematoma.   Skin: warm and dry, no rash Neuro:  Strength and sensation are intact Psych: euthymic mood, full affect   EKG:  EKG is ordered today. The ekg ordered today demonstrates sinus rhythm, heart rate 82, no acute ischemic changes and normal intervals  CATH: 09/05/2016  Prox RCA lesion, 30 %stenosed.  Mid RCA lesion, 30 %stenosed.  Ost 2nd Mrg to 2nd Mrg lesion, 20 %stenosed.  Ost Cx to Prox Cx lesion, 20 %stenosed.  Prox LAD lesion, 20 %stenosed.  A STENT SYNERGY DES 4X16 drug eluting stent was successfully placed, and overlaps previously placed stent.  Ost LAD lesion, 99 %stenosed.  Post intervention, there is a 0% residual stenosis.  Dist LAD lesion, 90 %stenosed.  The left ventricular ejection fraction is less than 25% by visual estimate.  There is severe left ventricular  systolic dysfunction.  LV end diastolic pressure is moderately elevated.  There is no mitral valve regurgitation.  1. Severe single vessel CAD. Severe ulcerated stenosis ostial LAD 2. Successful PTCA/DES x 1 ostial LAD 3. Severe segmental LV systolic dysfunction Recommendations: DAPT for at least one year with ASA and Brilinta. Continue Ace-inh and beta blocker. Consider Lifevest before discharge given LV systolic dysfunction. He may benefit from mild diuresis as well. I will give one dose of IV Lasix today.(Life vest not felt necessary) Post-Intervention Diagram        ECHO: 09/06/2016 - Left ventricle: The cavity size was normal. There was moderate   concentric hypertrophy. Systolic function was severely reduced.   The estimated ejection fraction was in the range of 20% to  25%.   Doppler parameters are consistent with abnormal left ventricular   relaxation (grade 1 diastolic dysfunction). Doppler parameters   are consistent with elevated ventricular end-diastolic filling pressure. - Aortic valve: There was no regurgitation. - Aortic root: The aortic root was normal in size. - Mitral valve: There was no regurgitation. - Left atrium: The atrium was mildly dilated. - Right ventricle: Systolic function was normal. - Right atrium: The atrium was normal in size. - Tricuspid valve: There was no regurgitation. - Pulmonary arteries: Systolic pressure was within the normal range. - Pericardium, extracardiac: There was no pericardial effusion. Impressions: - After reviewing Definity images (both myself and Dr. Royann Shivers)   there does NOT appear to be an apical thrombus. Calcified false   tendon in the apex of LV noted as before which without Definity   can mimic a circumscribed thrombus. Also reviewed LV angiogram   during heart cath and there was not a filling defect. LVEF is   severely decreased estimated at 20-25%. There is diffuse   hypokinesis and aneurysmal dilatation of all of  the apical   segments  Recent Labs: 09/05/2016: Magnesium 1.9 09/06/2016: Hemoglobin 12.8; Platelets 172 09/07/2016: BUN 20; Creatinine, Ser 1.24; Potassium 3.6; Sodium 139    Lipid Panel    Component Value Date/Time   CHOL 119 09/05/2016 0205   CHOL 121 03/10/2015 0857   TRIG 33 09/05/2016 0205   HDL 46 09/05/2016 0205   HDL 51 03/10/2015 0857   CHOLHDL 2.6 09/05/2016 0205   VLDL 7 09/05/2016 0205   LDLCALC 66 09/05/2016 0205   LDLCALC 56 03/10/2015 0857     Wt Readings from Last 3 Encounters:  09/16/16 263 lb 12.8 oz (119.7 kg)  09/07/16 275 lb (124.7 kg)  03/06/16 260 lb (117.9 kg)     Other studies Reviewed: Additional studies/ records that were reviewed today include: Office notes, hospital records and testing.  ASSESSMENT AND PLAN:  1.  Non-STEMI: He is doing well post hospital. He is on appropriate medications with aspirin, Brilinta, high-dose statin, beta blocker and ACE inhibitor.  2. Ischemic cardiomyopathy: He is on a beta blocker and ACE inhibitor. He is not having any volume overload by exam. Continue current therapy, he takes a low dose of HCTZ combination with his ACE inhibitor twice a day. Recheck BMET. Recheck echocardiogram prior to follow-up with Dr. Allyson Sabal  3. Hypertension: Blood pressure is well controlled on current medications.  4. AKI: His BUN and creatinine had improved prior to discharge, but he also had low potassium at one point. We will check a renal profile today.  5. Hyperlipidemia: He is on high-dose statin. Recheck complete metabolic profile and lipid profile prior to follow-up with Dr. Allyson Sabal.   Current medicines are reviewed at length with the patient today.  The patient does not have concerns regarding medicines.  The following changes have been made:  no change  Labs/ tests ordered today include:   Orders Placed This Encounter  Procedures  . Hepatic function panel  . Lipid panel  . Comprehensive metabolic panel  . Basic metabolic  panel  . EKG 12-Lead    Echocardiogram   Disposition:   FU with Dr. Allyson Sabal in 3 months  Signed, Leanna Battles  09/16/2016 9:41 AM    [facility] Phone: [phone]; Fax: [phone]  This note was written with the assistance of speech recognition software. Please excuse any transcriptional errors.

## 2016-09-16 NOTE — Patient Instructions (Addendum)
Medication Instructions:  Your physician recommends that you continue on your current medications as directed. Please refer to the Current Medication list given to you today.  If you need a refill on your cardiac medications before your next appointment, please call your pharmacy.  Labwork: BMET TODAY, FLP,HEPATIC AND CMET AT NEXT APPT WOTH DR BERRY AT [facility] ON THE 1ST FLOOR  Follow-Up: Your physician wants you to follow-up in: 3 MONTHS WITH DR BERRY-AM APPT TO GET LAB DONE BEFORE APPT   Special Instructions:  GET LAB DONE BEFORE NEXT SCHEDULED DR BERRY APPT  ADDED-PER RHONDA-PLEASE SCHEDULE THE WEEK OR SO BEFORE DR Allyson Sabal SCHEDULED APPT-PT NOTIFIED OF ADDITION OF ECHO   Thank you for choosing [facility] [place]!!    RHONDA BARRETT, PA-C Marcelino Duster, LPN

## 2016-09-19 ENCOUNTER — Telehealth: Payer: Self-pay | Admitting: *Deleted

## 2016-09-19 NOTE — Telephone Encounter (Signed)
-----   Message from Darrol Jump, PA-C sent at 09/19/2016 11:39 AM EDT ----- Pt of Dr Allyson Sabal Please let him know his labs were good.  LDL right where we want it, HDL still a little low, exercise can help this. Kidney function at baseline and electrolytes ok. Blood sugar was 147. Thanks

## 2016-09-19 NOTE — Telephone Encounter (Signed)
Left msg to call.

## 2016-09-23 ENCOUNTER — Telehealth: Payer: Self-pay | Admitting: Cardiovascular Disease

## 2016-09-23 NOTE — Telephone Encounter (Signed)
Left message for Kevin Patel, dr berry has not been in the office for 2 weeks. He will return tomorrow.

## 2016-09-23 NOTE — Telephone Encounter (Signed)
New message    Nettie Elm from [facility] is calling because pt is scheduled for tomorrow and they need his referral signed by Dr. Allyson Sabal. She said they also need his most medical records.

## 2016-09-24 NOTE — Telephone Encounter (Signed)
Referral form signed by Dr. Allyson Sabal and faxed along with recent records: OV note, Labs, Cath and ECHO report, and discharge summary to [facility] [phone].

## 2016-10-01 ENCOUNTER — Encounter: Payer: Self-pay | Admitting: *Deleted

## 2016-10-01 NOTE — Telephone Encounter (Signed)
Letter w results mailed.

## 2016-12-17 ENCOUNTER — Other Ambulatory Visit (HOSPITAL_COMMUNITY): Payer: Medicare Other

## 2016-12-25 ENCOUNTER — Ambulatory Visit (HOSPITAL_COMMUNITY): Payer: Medicare Other | Attending: Cardiology

## 2016-12-25 ENCOUNTER — Other Ambulatory Visit: Payer: Self-pay

## 2016-12-25 VITALS — BP 125/71

## 2016-12-25 DIAGNOSIS — I5030 Unspecified diastolic (congestive) heart failure: Secondary | ICD-10-CM | POA: Insufficient documentation

## 2016-12-25 DIAGNOSIS — I255 Ischemic cardiomyopathy: Secondary | ICD-10-CM | POA: Diagnosis present

## 2016-12-25 DIAGNOSIS — I420 Dilated cardiomyopathy: Secondary | ICD-10-CM | POA: Diagnosis not present

## 2016-12-25 DIAGNOSIS — I510 Cardiac septal defect, acquired: Secondary | ICD-10-CM | POA: Insufficient documentation

## 2016-12-25 MED ORDER — PERFLUTREN LIPID MICROSPHERE
1.0000 mL | INTRAVENOUS | Status: AC | PRN
  Administered 2016-12-25: 1 mL via INTRAVENOUS

## 2017-01-01 ENCOUNTER — Ambulatory Visit (INDEPENDENT_AMBULATORY_CARE_PROVIDER_SITE_OTHER): Payer: Medicare Other | Admitting: Cardiovascular Disease

## 2017-01-01 ENCOUNTER — Encounter: Payer: Self-pay | Admitting: Cardiovascular Disease

## 2017-01-01 DIAGNOSIS — E785 Hyperlipidemia, unspecified: Secondary | ICD-10-CM | POA: Diagnosis not present

## 2017-01-01 DIAGNOSIS — I255 Ischemic cardiomyopathy: Secondary | ICD-10-CM | POA: Diagnosis not present

## 2017-01-01 DIAGNOSIS — I2129 ST elevation (STEMI) myocardial infarction involving other sites: Secondary | ICD-10-CM

## 2017-01-01 DIAGNOSIS — I10 Essential (primary) hypertension: Secondary | ICD-10-CM

## 2017-01-01 DIAGNOSIS — I236 Thrombosis of atrium, auricular appendage, and ventricle as current complications following acute myocardial infarction: Secondary | ICD-10-CM

## 2017-01-01 MED ORDER — CARVEDILOL 6.25 MG PO TABS: 6.2500 mg | ORAL_TABLET | Freq: Two times a day (BID) | ORAL | 1 refills | Status: DC

## 2017-01-01 NOTE — Assessment & Plan Note (Addendum)
History of ischemic cardiomyopathy with recent echo revealed EF in the 30-35% range which is somewhat improved from his EF form to the echo at the time of stent implantation. He is on good medical therapy although I am going to transition him from metoprolol to carvedilol. He may also benefit from transitioning from lisinopril to [place] to. He will see Baxter Hire, or deformity, back in the office in several weeks to titrate his carvedilol and potentially transition him to [place] . I'm going to refer him to Dr. Royann Shivers for consideration of ICD implantation for primary prevention of sudden cardiac death.

## 2017-01-01 NOTE — Progress Notes (Signed)
01/01/2017 Kevin Patel   [birth year]  [id number]  Primary Physician [name]., MD Primary Cardiologist: Runell Gess MD Roseanne Reno  HPI:  Mr Kevin Patel is a pleasant 71 year old, moderately overweight, married Caucasian male, father of 4 and grandfather to 8 grandchildren, whom I saw 03/06/16. He has a history of ischemic cardiomyopathy, status post myocardial infarction in 1998 treated with PCI stenting by Dr. Susa Griffins. His EF was in the 35% to 45% range with a question of mural thrombus in the past for which he has been on Coumadin anticoagulation. A followup echo performed in September of last year using Definity contrast showed an EF of 35% to 40% with no evidence of mural thrombus and, therefore, Coumadin was discontinued. His other problems include hypertension and hyperlipidemia. Since I saw him in the office a year ago he was admitted with unstable angina on 08/27/16 and underwent cardiac catheterization by Dr. Sanjuana Kava  revealing a patent proximal LAD stent with high-grade ostial/proximal disease. There is ostial/proximal LAD was stented successfully. His EF at that time was 20.5% which rose over the last several months with optimal medical therapy up to 30-35%. He denies chest pain or shortness of breath.    Current Outpatient Prescriptions  Medication Sig Dispense Refill  . acetaminophen (TYLENOL) 325 MG tablet Take 2 tablets (650 mg total) by mouth every 4 (four) hours as needed for headache or mild pain.    Marland Kitchen amLODipine (NORVASC) 5 MG tablet TAKE ONE TABLET BY MOUTH ONCE DAILY 90 tablet 3  . aspirin 81 MG tablet Take 81 mg by mouth every morning.     Marland Kitchen atorvastatin (LIPITOR) 80 MG tablet TAKE ONE TABLET BY MOUTH ONCE DAILY 90 tablet 3  . Flaxseed, Linseed, (FLAXSEED OIL PO) Take 1 tablet by mouth daily.     Marland Kitchen GLUCOSAMINE HCL PO Take 1 tablet by mouth daily.    Marland Kitchen lisinopril-hydrochlorothiazide (PRINZIDE,ZESTORETIC) 20-12.5 MG tablet TAKE ONE TABLET BY  MOUTH TWICE DAILY 180 tablet 3  . Multiple Vitamin (MULTIVITAMIN) capsule Take 1 capsule by mouth daily.    . nitroGLYCERIN (NITROSTAT) 0.4 MG SL tablet Place 1 tablet (0.4 mg total) under the tongue every 5 (five) minutes x 3 doses as needed for chest pain. 25 tablet 2  . ticagrelor (BRILINTA) 90 MG TABS tablet Take 1 tablet (90 mg total) by mouth 2 (two) times daily. 180 tablet 3  . vitamin C (ASCORBIC ACID) 500 MG tablet Take 500 mg by mouth daily.    . vitamin E 400 UNIT capsule Take 400 Units by mouth daily.    . carvedilol (COREG) 6.25 MG tablet Take 1 tablet (6.25 mg total) by mouth 2 (two) times daily. 60 tablet 1   No current facility-administered medications for this visit.     No Known Allergies  Social History   Social History  . Marital status: Married    Spouse name: N/A  . Number of children: N/A  . Years of education: N/A   Occupational History  . Not on file.   Social History Main Topics  . Smoking status: Former Smoker    Quit date: 02/12/1993  . Smokeless tobacco: Never Used  . Alcohol use No  . Drug use: No  . Sexual activity: Not on file   Other Topics Concern  . Not on file   Social History Narrative  . No narrative on file     Review of Systems: General: negative for chills, fever, night  sweats or weight changes.  Cardiovascular: negative for chest pain, dyspnea on exertion, edema, orthopnea, palpitations, paroxysmal nocturnal dyspnea or shortness of breath Dermatological: negative for rash Respiratory: negative for cough or wheezing Urologic: negative for hematuria Abdominal: negative for nausea, vomiting, diarrhea, bright red blood per rectum, melena, or hematemesis Neurologic: negative for visual changes, syncope, or dizziness All other systems reviewed and are otherwise negative except as noted above.    Blood pressure 114/62, pulse (!) 50, height 6\' 4"  (1.93 m), weight 243 lb (110.2 kg).  General appearance: alert and no distress Neck: no  adenopathy, no carotid bruit, no JVD, supple, symmetrical, trachea midline and thyroid not enlarged, symmetric, no tenderness/mass/nodules Lungs: clear to auscultation bilaterally Heart: regular rate and rhythm, S1, S2 normal, no murmur, click, rub or gallop Extremities: extremities normal, atraumatic, no cyanosis or edema  EKG not performed today  ASSESSMENT AND PLAN:   Cardiomyopathy, ischemic History of ischemic cardiomyopathy with recent echo revealed EF in the 30-35% range which is somewhat improved from his EF form to the echo at the time of stent implantation. He is on good medical therapy although I am going to transition him from metoprolol to carvedilol. He may also benefit from transitioning from lisinopril to [place] to. He will see Baxter Hire, or deformity, back in the office in several weeks to titrate his carvedilol and potentially transition him to [place] . I'm going to refer him to Dr. Royann Shivers for consideration of ICD implantation for primary prevention of sudden cardiac death.  Essential hypertension History of essential hypertension blood pressure measured today at 118/62. He is on amlodipine, lisinopril, Hytrin 455 and metoprolol. Continue current measures and current dosing  Dyslipidemia History of dyslipidemia on statin therapy with recent lipid profile performed 09/16/16 revealed total cholesterol of 103, LDL 44 and HDL 37.  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      Runell Gess MD FACP,FACC,FAHA, [facility] 01/01/2017 9:03 AM

## 2017-01-01 NOTE — Assessment & Plan Note (Signed)
2 recent echoes showed no evidence of mural thrombus and therefore he does not need to be on Coumadin anticoagulation.

## 2017-01-01 NOTE — Patient Instructions (Signed)
Medication Instructions: Discontinue Metoprolol   START Carvedilol 6.25 mg twice daily.   Follow-Up: Your physician recommends that you schedule a follow-up appointment in: 2-3 weeks with PharmD med medication titration.  Schedule an appointment with Dr. Royann Shivers for evaluation of ICD Implantation for primary prevention.  Your physician wants you to follow-up in: 6 months with Dr. Allyson Sabal. You will receive a reminder letter in the mail two months in advance. If you don't receive a letter, please call our office to schedule the follow-up appointment.  If you need a refill on your cardiac medications before your next appointment, please call your pharmacy.

## 2017-01-01 NOTE — Assessment & Plan Note (Signed)
History of essential hypertension blood pressure measured today at 118/62. He is on amlodipine, lisinopril, Hytrin 455 and metoprolol. Continue current measures and current dosing

## 2017-01-01 NOTE — Assessment & Plan Note (Signed)
History of dyslipidemia on statin therapy with recent lipid profile performed 09/16/16 revealed total cholesterol of 103, LDL 44 and HDL 37.

## 2017-01-22 ENCOUNTER — Ambulatory Visit (INDEPENDENT_AMBULATORY_CARE_PROVIDER_SITE_OTHER): Payer: Medicare Other | Admitting: Pharmacist

## 2017-01-22 VITALS — BP 118/80 | HR 54 | Wt 243.8 lb

## 2017-01-22 DIAGNOSIS — I255 Ischemic cardiomyopathy: Secondary | ICD-10-CM

## 2017-01-22 MED ORDER — CARVEDILOL 12.5 MG PO TABS: 12.5000 mg | ORAL_TABLET | Freq: Two times a day (BID) | ORAL | 5 refills | Status: DC

## 2017-01-22 NOTE — Assessment & Plan Note (Signed)
Blood pressure today is better controlled today. Patient is tolerating therapy well and denies problems or adverse drug events.  Will increase carvedilol to 12.5mg  twice daily and continue close monitoring to assess tolerability.

## 2017-01-22 NOTE — Patient Instructions (Addendum)
Return for a  follow up appointment in 4 weeks  Your blood pressure today is 118/80 pulse 54  Check your blood pressure at home daily (if able) and keep record of the readings.  Take your BP meds as follows: *INCREASE carvedilol to 12.5mg  twice daily* (okay to use 2 tablets of carvedilol 6.25mg  twice daily until gone) Continue all other medication as previously prescribed  Bring  your BP cuff and your record of home blood pressures to your next appointment.  Exercise as you're able, try to walk approximately 30 minutes per day.  Keep salt intake to a minimum, especially watch canned and prepared boxed foods.  Eat more fresh fruits and vegetables and fewer canned items.  Avoid eating in fast food restaurants.    HOW TO TAKE YOUR BLOOD PRESSURE: . Rest 5 minutes before taking your blood pressure. .  Don't smoke or drink caffeinated beverages for at least 30 minutes before. . Take your blood pressure before (not after) you eat. . Sit comfortably with your back supported and both feet on the floor (don't cross your legs). . Elevate your arm to heart level on a table or a desk. . Use the proper sized cuff. It should fit smoothly and snugly around your bare upper arm. There should be enough room to slip a fingertip under the cuff. The bottom edge of the cuff should be 1 inch above the crease of the elbow. . Ideally, take 3 measurements at one sitting and record the average.

## 2017-01-22 NOTE — Progress Notes (Signed)
Patient ID: [name]                 DOB: [birth year]                      MRN: [id number]     HPI: Kevin Patel is a 71 y.o. male referred by Dr. Allyson Sabal to HTN clinic. PMH includes ischemic cardiomyopathy, hx of MI in 1998 s/p stent placement and EF 30-35%. Patient metoprolol was changed to carvedilol 6.26mg  twice daily on 01/01/2017. Patient presents to clinic for beta blocker titration and hypertension monitoring. Denies swelling, dizziness, fatigue or chest pain.  Current HTN meds:  Carvedilol 6.25mg  twice daily Amlodipine 5mg  daily Lisinopril/HCTZ 20-12.5mg  daily  Previously tried:  Metoprolol 25mg  twice daily  BP goal: <130/80  Social History: former smoker quit in 1994, denies smokeless tobacco, denies alcohol or drug use  Diet: low sodium but no special diet  Exercise: 3 times per week for 30 minutes  Home BP readings: 20 readings; average BP 123/80; pulse 63-85  Wt Readings from Last 3 Encounters:  01/22/17 243 lb 12.8 oz (110.6 kg)  01/01/17 243 lb (110.2 kg)  09/16/16 263 lb 12.8 oz (119.7 kg)   BP Readings from Last 3 Encounters:  01/22/17 118/80  01/01/17 114/62  12/25/16 125/71   Pulse Readings from Last 3 Encounters:  01/22/17 (!) 54  01/01/17 (!) 50  09/16/16 82    Past Medical History:  Diagnosis Date  . Coronary artery disease   . Diabetes mellitus with coincident hypertension (HCC) 09/09/2016  . History of kidney stones   . HTN (hypertension)   . Hyperlipidemia   . Ischemic cardiomyopathy   . MI (myocardial infarction) (HCC) 1998   myoview 09/25/2006-no ischemia, EF 46%  . Mural thrombus of heart    on coumadin until follow up echo with contrast in Sept 13, 2012 which showed no thrombus and coumadin was d/c    Current Outpatient Prescriptions on File Prior to Visit  Medication Sig Dispense Refill  . acetaminophen (TYLENOL) 325 MG tablet Take 2 tablets (650 mg total) by mouth every 4 (four) hours as needed for headache or mild pain.    Marland Kitchen  amLODipine (NORVASC) 5 MG tablet TAKE ONE TABLET BY MOUTH ONCE DAILY 90 tablet 3  . aspirin 81 MG tablet Take 81 mg by mouth every morning.     Marland Kitchen atorvastatin (LIPITOR) 80 MG tablet TAKE ONE TABLET BY MOUTH ONCE DAILY 90 tablet 3  . Flaxseed, Linseed, (FLAXSEED OIL PO) Take 1 tablet by mouth daily.     Marland Kitchen GLUCOSAMINE HCL PO Take 1 tablet by mouth daily.    Marland Kitchen lisinopril-hydrochlorothiazide (PRINZIDE,ZESTORETIC) 20-12.5 MG tablet TAKE ONE TABLET BY MOUTH TWICE DAILY 180 tablet 3  . Multiple Vitamin (MULTIVITAMIN) capsule Take 1 capsule by mouth daily.    . nitroGLYCERIN (NITROSTAT) 0.4 MG SL tablet Place 1 tablet (0.4 mg total) under the tongue every 5 (five) minutes x 3 doses as needed for chest pain. 25 tablet 2  . ticagrelor (BRILINTA) 90 MG TABS tablet Take 1 tablet (90 mg total) by mouth 2 (two) times daily. 180 tablet 3  . vitamin C (ASCORBIC ACID) 500 MG tablet Take 500 mg by mouth daily.    . vitamin E 400 UNIT capsule Take 400 Units by mouth daily.     No current facility-administered medications on file prior to visit.     No Known Allergies  Blood pressure 118/80, pulse Marland Kitchen)  54, weight 243 lb 12.8 oz (110.6 kg).  Cardiomyopathy, ischemic Blood pressure today is better controlled today. Patient is tolerating therapy well and denies problems or adverse drug events.  Will increase carvedilol to 12.5mg  twice daily and continue close monitoring to assess tolerability.    Kevin Patel PharmD, BCPS [facility] [address] [place] [address] 01/22/2017 8:36 PM

## 2017-01-27 NOTE — Progress Notes (Signed)
Cardiology Office Note:    Date:  01/30/2017   ID:  JEFTE SEAWOOD, DOB 02-15-1946, MRN [id number]  PCP:  [name]., MD  Cardiologist:  [name], MD ; Nanetta Batty, MD   Referring MD: [name]., MD   Chief complaint: evaluate for ICD - primary prevention  History of Present Illness:    Kevin Patel is a 71 y.o. male with a hx of longstanding CAD and moderate to severe ischemic cardiomyopathy (LVEF in past as low as 20-25% before Synergy DES 4x16 to ostial LAD 09/05/2016, now improved to 30-35% by last echo 12/25/2016), with well compensated CHF (NYHA class 2) on comprehensive medical therapy (carvedilol, lisinopril, ASA/Brilinta, high dose statin, not requiring daily loop diuretic). Previously reported LV apical thrombus has not been seen on his recent echos since 2012 or his recent LV angiogram.  Dr. Allyson Sabal mentioned possible transition to [facility] in his 07/25 note, but this has not yet occurred. He is still also taking amlodipine.  Comorbid conditions include HTN, HLP, DM.  He has excellent functional status. He enjoys bowling and walks on a regular basis. He takes care of his own yard work. He denies exertional angina and has minimal exertional dyspnea. He has not experienced syncope, palpitations, leg edema or claudication. NYHA functional class I-II.  Past Medical History:  Diagnosis Date  . Coronary artery disease   . Diabetes mellitus with coincident hypertension (HCC) 09/09/2016  . History of kidney stones   . HTN (hypertension)   . Hyperlipidemia   . Ischemic cardiomyopathy   . MI (myocardial infarction) (HCC) 1998   myoview 09/25/2006-no ischemia, EF 46%  . Mural thrombus of heart    on coumadin until follow up echo with contrast in Sept 13, 2012 which showed no thrombus and coumadin was d/c    Past Surgical History:  Procedure Laterality Date  . CORONARY ANGIOPLASTY  1998   PCI  . CORONARY STENT INTERVENTION N/A 09/05/2016   Procedure: Coronary  Stent Intervention;  Surgeon: [name], MD;  Location[facility] INVASIVE CV LAB;  Service: Cardiovascular;  Laterality: N/A;  . INTRAVASCULAR ULTRASOUND/IVUS N/A 09/05/2016   Procedure: Intravascular Ultrasound/IVUS;  Surgeon: [name], MD;  Location: [facility] INVASIVE CV LAB;  Service: Cardiovascular;  Laterality: N/A;  . LEFT HEART CATH AND CORONARY ANGIOGRAPHY N/A 09/05/2016   Procedure: Left Heart Cath and Coronary Angiography;  Surgeon: [name], MD;  Location: [facility] INVASIVE CV LAB;  Service: Cardiovascular;  Laterality: N/A;    Current Medications: Current Meds  Medication Sig  . acetaminophen (TYLENOL) 325 MG tablet Take 2 tablets (650 mg total) by mouth every 4 (four) hours as needed for headache or mild pain.  Marland Kitchen aspirin 81 MG tablet Take 81 mg by mouth every morning.   Marland Kitchen atorvastatin (LIPITOR) 80 MG tablet TAKE ONE TABLET BY MOUTH ONCE DAILY  . carvedilol (COREG) 12.5 MG tablet Take 1 tablet (12.5 mg total) by mouth 2 (two) times daily.  . cephALEXin (KEFLEX) 500 MG capsule Take 1 capsule by mouth as directed.  Marland Kitchen GLUCOSAMINE HCL PO Take 1 tablet by mouth daily.  . Multiple Vitamin (MULTIVITAMIN) capsule Take 1 capsule by mouth daily.  . nitroGLYCERIN (NITROSTAT) 0.4 MG SL tablet Place 1 tablet (0.4 mg total) under the tongue every 5 (five) minutes x 3 doses as needed for chest pain.  . ticagrelor (BRILINTA) 90 MG TABS tablet Take 1 tablet (90 mg total) by mouth 2 (two) times daily.  . vitamin  C (ASCORBIC ACID) 500 MG tablet Take 500 mg by mouth daily.  . vitamin E 400 UNIT capsule Take 400 Units by mouth daily.  . [DISCONTINUED] amLODipine (NORVASC) 5 MG tablet TAKE ONE TABLET BY MOUTH ONCE DAILY  . [DISCONTINUED] lisinopril-hydrochlorothiazide (PRINZIDE,ZESTORETIC) 20-12.5 MG tablet TAKE ONE TABLET BY MOUTH TWICE DAILY     Allergies:   Patient has no known allergies.   Social History   Social History  . Marital status: Married    Spouse name: N/A  .  Number of children: N/A  . Years of education: N/A   Social History Main Topics  . Smoking status: Former Smoker    Quit date: 02/12/1993  . Smokeless tobacco: Never Used  . Alcohol use No  . Drug use: No  . Sexual activity: Not Asked   Other Topics Concern  . None   Social History Narrative  . None     Family History: The patient's family history includes Emphysema in his father. ROS:   Please see the history of present illness.     All other systems reviewed and are negative.  EKGs/Labs/Other Studies Reviewed:    The following studies were reviewed today: Last echocardiogram and coronary angiograms, last note from Dr. Allyson Sabal  EKG:  EKG is ordered today.  Today's ECG shows sinus bradycardia with a bigeminal rhythm (most likely PVCs, cannot exclude PACs with aberrancy), old anteroseptal infarction, mild ST segment depression and T-wave inversion in the lateral leads, narrow QRS 90 ms, QT normal at 400 ms. The ekg ordered 09/16/2016 demonstrates NSR, LAFB, old anteroseptal infarct, narrow QRS.  Recent Labs: 09/05/2016: Magnesium 1.9 09/06/2016: Hemoglobin 12.8; Platelets 172 09/16/2016: ALT 17; ALT 17; BUN 23; BUN 23; Creat 1.26; Creat 1.26; Potassium 4.1; Potassium 4.1; Sodium 138; Sodium 138  Recent Lipid Panel    Component Value Date/Time   CHOL 103 09/16/2016 0949   CHOL 121 03/10/2015 0857   TRIG 110 09/16/2016 0949   HDL 37 (L) 09/16/2016 0949   HDL 51 03/10/2015 0857   CHOLHDL 2.8 09/16/2016 0949   VLDL 22 09/16/2016 0949   LDLCALC 44 09/16/2016 0949   LDLCALC 56 03/10/2015 0857    Physical Exam:    VS:  BP 98/64   Pulse (!) 55   Ht 6\' 4"  (1.93 m)   Wt 245 lb (111.1 kg)   BMI 29.82 kg/m     Wt Readings from Last 3 Encounters:  01/30/17 245 lb (111.1 kg)  01/22/17 243 lb 12.8 oz (110.6 kg)  01/01/17 243 lb (110.2 kg)     GEN:  Well nourished, well developed in no acute distress HEENT: Normal NECK: No JVD; No carotid bruits LYMPHATICS: No  lymphadenopathy CARDIAC: RRR, no murmurs, rubs, gallops RESPIRATORY:  Clear to auscultation without rales, wheezing or rhonchi  ABDOMEN: Soft, non-tender, non-distended MUSCULOSKELETAL:  No edema; No deformity  SKIN: Warm and dry NEUROLOGIC:  Alert and oriented x 3 PSYCHIATRIC:  Normal affect   ASSESSMENT:    1. At risk for sudden cardiac death   2. Chronic systolic heart failure (HCC)   3. Coronary artery disease involving coronary bypass graft of native heart without angina pectoris   4. Essential hypertension   5. 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 Dyslipidemia   7. Pre-procedure lab exam   8. Medication management    PLAN:    In order of problems listed above:  1. MADIT-2:  He meets criteria for primary prevention ICD implantation for ischemic cardiomyopathy (Prior myocardial infarction, left ventricular ejection fraction under 35%, heart failure NYHA class II-III, on comprehensive medical therapy, >90 days since last revascularization). The procedure was discussed in detail, as well as possible short and long term complications, follow-up, etc. We reviewed the fact that the device itself will have no impact on heart pumping function or quality of life, but statistically may prolong his life. This procedure has been fully reviewed with the patient and written informed consent has been obtained. 2. CHF: euvolemic without loop diuretics, well compensated. 3. CAD: s/p anterior MI 1998, s/p LAD-DES 09/05/2016 4. HTN: can stop amlodipine to allow more RAAS inhibition/Entresto. For the time being will transition from lisinopril to losartan to allow an easier change to [facility] in the future. 5. DM: good control, last A1c 6.5% 6. HLP:  Excellent recent lipid profile.   Medication Adjustments/Labs and Tests Ordered: Current medicines are reviewed at length with the patient today.  Concerns  regarding medicines are outlined above.  Orders Placed This Encounter  Procedures  . Basic metabolic panel  . CBC  . Protime-INR  . EKG 12-Lead   Meds ordered this encounter  Medications  . losartan-hydrochlorothiazide (HYZAAR) 100-12.5 MG tablet    Sig: Take 1 tablet by mouth daily.    Dispense:  90 tablet    Refill:  3    Signed, [name], MD  01/30/2017 11:53 AM    [facility]

## 2017-01-30 ENCOUNTER — Ambulatory Visit (INDEPENDENT_AMBULATORY_CARE_PROVIDER_SITE_OTHER): Payer: Medicare Other | Admitting: Cardiovascular Disease

## 2017-01-30 ENCOUNTER — Encounter: Payer: Self-pay | Admitting: Cardiovascular Disease

## 2017-01-30 VITALS — BP 98/64 | HR 55 | Ht 76.0 in | Wt 245.0 lb

## 2017-01-30 DIAGNOSIS — Z01812 Encounter for preprocedural laboratory examination: Secondary | ICD-10-CM

## 2017-01-30 DIAGNOSIS — Z9189 Other specified personal risk factors, not elsewhere classified: Secondary | ICD-10-CM | POA: Diagnosis not present

## 2017-01-30 DIAGNOSIS — Z79899 Other long term (current) drug therapy: Secondary | ICD-10-CM | POA: Diagnosis not present

## 2017-01-30 DIAGNOSIS — I5022 Chronic systolic (congestive) heart failure: Secondary | ICD-10-CM

## 2017-01-30 DIAGNOSIS — I255 Ischemic cardiomyopathy: Secondary | ICD-10-CM

## 2017-01-30 DIAGNOSIS — I10 Essential (primary) hypertension: Secondary | ICD-10-CM | POA: Diagnosis not present

## 2017-01-30 DIAGNOSIS — I25810 Atherosclerosis of coronary artery bypass graft(s) without angina pectoris: Secondary | ICD-10-CM

## 2017-01-30 DIAGNOSIS — E785 Hyperlipidemia, unspecified: Secondary | ICD-10-CM | POA: Diagnosis not present

## 2017-01-30 DIAGNOSIS — E0859 Diabetes mellitus due to underlying condition with other circulatory complications: Secondary | ICD-10-CM

## 2017-01-30 MED ORDER — LOSARTAN POTASSIUM-HCTZ 100-12.5 MG PO TABS: 1.0000 | ORAL_TABLET | Freq: Every day | ORAL | 3 refills | Status: DC

## 2017-01-30 NOTE — Patient Instructions (Addendum)
Dr Royann Shivers has recommended making the following medication changes: 1. STOP Amlodipine 2. STOP Lisinopril HCTZ 3. START Losartan-Hydrochlorothiazide 100-12.5 mg - take 1 tablet daily  Cardioverter Defibrillator Implantation An implantable cardioverter defibrillator (ICD) is a small device that is placed under the skin in the chest or abdomen. An ICD consists of a battery, a small computer (pulse generator), and wires (leads) that go into the heart. An ICD is used to detect and correct two types of dangerous irregular heartbeats (arrhythmias):  A rapid heart rhythm (tachycardia).  An arrhythmia in which the lower chambers of the heart (ventricles) contract in an uncoordinated way (fibrillation).  When an ICD detects tachycardia, it sends a low-energy shock to the heart to restore the heartbeat to normal (cardioversion). This signal is usually painless. If cardioversion does not work or if the ICD detects fibrillation, it delivers a high-energy shock to the heart (defibrillation) to restart the heart. This shock may feel like a strong jolt in the chest. Your health care provider may prescribe an ICD if:  You have had an arrhythmia that originated in the ventricles.  Your heart has been damaged by a disease or heart condition.  Sometimes, ICDs are programmed to act as a device called a pacemaker. Pacemakers can be used to treat a slow heartbeat (bradycardia) or tachycardia by taking over the heart rate with electrical impulses. Tell a health care provider about:  Any allergies you have.  All medicines you are taking, including vitamins, herbs, eye drops, creams, and over-the-counter medicines.  Any problems you or family members have had with anesthetic medicines.  Any blood disorders you have.  Any surgeries you have had.  Any medical conditions you have.  Whether you are pregnant or may be pregnant. What are the risks? Generally, this is a safe procedure. However, problems may  occur, including:  Swelling, bleeding, or bruising.  Infection.  Blood clots.  Damage to other structures or organs, such as nerves, blood vessels, or the heart.  Allergic reactions to medicines used during the procedure.  What happens before the procedure? Staying hydrated Follow instructions from your health care provider about hydration, which may include:  Up to 2 hours before the procedure - you may continue to drink clear liquids, such as water, clear fruit juice, black coffee, and plain tea.  Eating and drinking restrictions Follow instructions from your health care provider about eating and drinking, which may include:  8 hours before the procedure - stop eating heavy meals or foods such as meat, fried foods, or fatty foods.  6 hours before the procedure - stop eating light meals or foods, such as toast or cereal.  6 hours before the procedure - stop drinking milk or drinks that contain milk.  2 hours before the procedure - stop drinking clear liquids.  Medicine Ask your health care provider about:  Changing or stopping your normal medicines. This is important if you take diabetes medicines or blood thinners.  Taking medicines such as aspirin and ibuprofen. These medicines can thin your blood. Do not take these medicines before your procedure if your doctor tells you not to.  Tests  You may have blood tests.  You may have a test to check the electrical signals in your heart (electrocardiogram, ECG).  You may have imaging tests, such as a chest X-ray. General instructions  For 24 hours before the procedure, stop using products that contain nicotine or tobacco, such as cigarettes and e-cigarettes. If you need help quitting, ask your  health care provider.  Plan to have someone take you home from the hospital or clinic.  You may be asked to shower with a germ-killing soap. What happens during the procedure?  To reduce your risk of infection: ? Your health care  team will wash or sanitize their hands. ? Your skin will be washed with soap. ? Hair may be removed from the surgical area.  Small monitors will be put on your body. They will be used to check your heart, blood pressure, and oxygen level.  An IV tube will be inserted into one of your veins.  You will be given one or more of the following: ? A medicine to help you relax (sedative). ? A medicine to numb the area (local anesthetic). ? A medicine to make you fall asleep (general anesthetic).  Leads will be guided through a blood vessel into your heart and attached to your heart muscles. Depending on the ICD, the leads may go into one ventricle or they may go into both ventricles and into an upper chamber of the heart. An X-ray machine (fluoroscope) will be usedto help guide the leads.  A small incision will be made to create a deep pocket under your skin.  The pulse generator will be placed into the pocket.  The ICD will be tested.  The incision will be closed with stitches (sutures), skin glue, or staples.  A bandage (dressing) will be placed over the incision. This procedure may vary among health care providers and hospitals. What happens after the procedure?  Your blood pressure, heart rate, breathing rate, and blood oxygen level will be monitored often until the medicines you were given have worn off.  A chest X-ray will be taken to check that the ICD is in the right place.  You will need to stay in the hospital for 1-2 days so your health care provider can make sure your ICD is working.  Do not drive for 24 hours if you received a sedative. Ask your health care provider when it is safe for you to drive.  You may be given an identification card explaining that you have an ICD. Summary  An implantable cardioverter defibrillator (ICD) is a small device that is placed under the skin in the chest or abdomen. It is used to detect and correct dangerous irregular heartbeats  (arrhythmias).  An ICD consists of a battery, a small computer (pulse generator), and wires (leads) that go into the heart.  When an ICD detects rapid heart rhythm (tachycardia), it sends a low-energy shock to the heart to restore the heartbeat to normal (cardioversion). If cardioversion does not work or if the ICD detects uncoordinated heart contractions (fibrillation), it delivers a high-energy shock to the heart (defibrillation) to restart the heart.  You will need to stay in the hospital for 1-2 days to make sure your ICD is working. This information is not intended to replace advice given to you by your health care provider. Make sure you discuss any questions you have with your health care provider. Document Released: 02/16/2002 Document Revised: 06/05/2016 Document Reviewed: 06/05/2016 Elsevier Interactive Patient Education  2017 [employer].

## 2017-02-06 LAB — BASIC METABOLIC PANEL
BUN/Creatinine Ratio: 20 (ref 10–24)
BUN: 23 mg/dL (ref 8–27)
CALCIUM: 9.6 mg/dL (ref 8.6–10.2)
CO2: 23 mmol/L (ref 20–29)
CREATININE: 1.16 mg/dL (ref 0.76–1.27)
Chloride: 106 mmol/L (ref 96–106)
GFR, EST AFRICAN AMERICAN: 73 mL/min/{1.73_m2} (ref >59)
GFR, EST NON AFRICAN AMERICAN: 63 mL/min/{1.73_m2} (ref >59)
Glucose: 120 mg/dL — ABNORMAL HIGH (ref 65–99)
POTASSIUM: 4.3 mmol/L (ref 3.5–5.2)
Sodium: 144 mmol/L (ref 134–144)

## 2017-02-06 LAB — CBC
HEMATOCRIT: 39.9 % (ref 37.5–51.0)
HEMOGLOBIN: 13.9 g/dL (ref 13.0–17.7)
MCH: 33.9 pg — ABNORMAL HIGH (ref 26.6–33.0)
MCHC: 34.8 g/dL (ref 31.5–35.7)
MCV: 97 fL (ref 79–97)
Platelets: 206 10*3/uL (ref 150–379)
RBC: 4.1 x10E6/uL — AB (ref 4.14–5.80)
RDW: 12.1 % — ABNORMAL LOW (ref 12.3–15.4)
WBC: 7.4 10*3/uL (ref 3.4–10.8)

## 2017-02-06 LAB — PROTIME-INR
INR: 1 (ref 0.8–1.2)
PROTHROMBIN TIME: 10.3 s (ref 9.1–12.0)

## 2017-02-11 ENCOUNTER — Other Ambulatory Visit: Payer: Self-pay | Admitting: Cardiovascular Disease

## 2017-02-11 DIAGNOSIS — Z9189 Other specified personal risk factors, not elsewhere classified: Secondary | ICD-10-CM

## 2017-02-11 DIAGNOSIS — I255 Ischemic cardiomyopathy: Secondary | ICD-10-CM

## 2017-02-12 ENCOUNTER — Ambulatory Visit (HOSPITAL_COMMUNITY)
Admission: RE | Admit: 2017-02-12 | Discharge: 2017-02-13 | Disposition: A | Discharge status: Home / Self Care | Payer: Medicare Other | Source: Ambulatory Visit | Attending: Cardiovascular Disease | Admitting: Cardiovascular Disease

## 2017-02-12 ENCOUNTER — Encounter (HOSPITAL_COMMUNITY)
Admission: RE | Disposition: A | Discharge status: Home / Self Care | Payer: Self-pay | Source: Ambulatory Visit | Attending: Cardiovascular Disease

## 2017-02-12 DIAGNOSIS — I110 Hypertensive heart disease with heart failure: Secondary | ICD-10-CM | POA: Insufficient documentation

## 2017-02-12 DIAGNOSIS — I2510 Atherosclerotic heart disease of native coronary artery without angina pectoris: Secondary | ICD-10-CM | POA: Insufficient documentation

## 2017-02-12 DIAGNOSIS — E119 Type 2 diabetes mellitus without complications: Secondary | ICD-10-CM | POA: Diagnosis not present

## 2017-02-12 DIAGNOSIS — Z95810 Presence of automatic (implantable) cardiac defibrillator: Secondary | ICD-10-CM | POA: Diagnosis present

## 2017-02-12 DIAGNOSIS — Z79899 Other long term (current) drug therapy: Secondary | ICD-10-CM | POA: Insufficient documentation

## 2017-02-12 DIAGNOSIS — Z825 Family history of asthma and other chronic lower respiratory diseases: Secondary | ICD-10-CM | POA: Diagnosis not present

## 2017-02-12 DIAGNOSIS — I252 Old myocardial infarction: Secondary | ICD-10-CM | POA: Diagnosis not present

## 2017-02-12 DIAGNOSIS — Z9189 Other specified personal risk factors, not elsewhere classified: Secondary | ICD-10-CM

## 2017-02-12 DIAGNOSIS — Z87442 Personal history of urinary calculi: Secondary | ICD-10-CM | POA: Insufficient documentation

## 2017-02-12 DIAGNOSIS — Z955 Presence of coronary angioplasty implant and graft: Secondary | ICD-10-CM | POA: Diagnosis not present

## 2017-02-12 DIAGNOSIS — E785 Hyperlipidemia, unspecified: Secondary | ICD-10-CM | POA: Insufficient documentation

## 2017-02-12 DIAGNOSIS — I5022 Chronic systolic (congestive) heart failure: Secondary | ICD-10-CM | POA: Diagnosis not present

## 2017-02-12 DIAGNOSIS — I255 Ischemic cardiomyopathy: Secondary | ICD-10-CM | POA: Diagnosis not present

## 2017-02-12 DIAGNOSIS — Z87891 Personal history of nicotine dependence: Secondary | ICD-10-CM | POA: Insufficient documentation

## 2017-02-12 DIAGNOSIS — Z7982 Long term (current) use of aspirin: Secondary | ICD-10-CM | POA: Insufficient documentation

## 2017-02-12 HISTORY — DX: Presence of automatic (implantable) cardiac defibrillator: Z95.810

## 2017-02-12 HISTORY — PX: ICD IMPLANT: EP1208

## 2017-02-12 HISTORY — DX: Chronic systolic (congestive) heart failure: I50.22

## 2017-02-12 LAB — GLUCOSE, CAPILLARY: Glucose-Capillary: 103 mg/dL — ABNORMAL HIGH (ref 65–99)

## 2017-02-12 LAB — SURGICAL PCR SCREEN
MRSA, PCR: NEGATIVE
STAPHYLOCOCCUS AUREUS: NEGATIVE

## 2017-02-12 SURGERY — ICD IMPLANT

## 2017-02-12 MED ORDER — SODIUM CHLORIDE 0.9% FLUSH
3.0000 mL | Freq: Two times a day (BID) | INTRAVENOUS | Status: DC
  Administered 2017-02-13: 3 mL via INTRAVENOUS

## 2017-02-12 MED ORDER — SODIUM CHLORIDE 0.9 % IV SOLN
INTRAVENOUS | Status: DC
  Administered 2017-02-12: 13:00:00 via INTRAVENOUS

## 2017-02-12 MED ORDER — HEPARIN (PORCINE) IN NACL 2-0.9 UNIT/ML-% IJ SOLN
INTRAMUSCULAR | Status: AC | PRN
  Administered 2017-02-12: 500 mL

## 2017-02-12 MED ORDER — LIDOCAINE HCL (PF) 1 % IJ SOLN
INTRAMUSCULAR | Status: AC
  Filled 2017-02-12: qty 60

## 2017-02-12 MED ORDER — MIDAZOLAM HCL 5 MG/5ML IJ SOLN
INTRAMUSCULAR | Status: AC
  Filled 2017-02-12: qty 5

## 2017-02-12 MED ORDER — FENTANYL CITRATE (PF) 100 MCG/2ML IJ SOLN
INTRAMUSCULAR | Status: DC | PRN
Start: 2017-02-12 — End: 2017-02-12
  Administered 2017-02-12: 25 ug via INTRAVENOUS

## 2017-02-12 MED ORDER — SODIUM CHLORIDE 0.9 % IV SOLN: 250.0000 mL | INTRAVENOUS | Status: DC | PRN

## 2017-02-12 MED ORDER — CHLORHEXIDINE GLUCONATE 4 % EX LIQD: 60.0000 mL | Freq: Once | CUTANEOUS | Status: DC

## 2017-02-12 MED ORDER — ASPIRIN EC 81 MG PO TBEC
81.0000 mg | DELAYED_RELEASE_TABLET | ORAL | Status: DC
  Administered 2017-02-13: 81 mg via ORAL
  Filled 2017-02-12: qty 1

## 2017-02-12 MED ORDER — HYDROCODONE-ACETAMINOPHEN 5-325 MG PO TABS
1.0000 | ORAL_TABLET | ORAL | Status: DC | PRN
  Administered 2017-02-13: 2 via ORAL
  Filled 2017-02-12: qty 2

## 2017-02-12 MED ORDER — NITROGLYCERIN 0.4 MG SL SUBL: 0.4000 mg | SUBLINGUAL_TABLET | SUBLINGUAL | Status: DC | PRN

## 2017-02-12 MED ORDER — LIDOCAINE HCL (PF) 1 % IJ SOLN
INTRAMUSCULAR | Status: DC | PRN
  Administered 2017-02-12: 60 mL

## 2017-02-12 MED ORDER — MUPIROCIN 2 % EX OINT
TOPICAL_OINTMENT | CUTANEOUS | Status: AC
  Administered 2017-02-12: 1
  Filled 2017-02-12: qty 22

## 2017-02-12 MED ORDER — SODIUM CHLORIDE 0.9% FLUSH: 3.0000 mL | INTRAVENOUS | Status: DC | PRN

## 2017-02-12 MED ORDER — HEPARIN (PORCINE) IN NACL 2-0.9 UNIT/ML-% IJ SOLN
INTRAMUSCULAR | Status: AC
  Filled 2017-02-12: qty 500

## 2017-02-12 MED ORDER — MIDAZOLAM HCL 5 MG/5ML IJ SOLN
INTRAMUSCULAR | Status: DC | PRN
  Administered 2017-02-12: 1 mg via INTRAVENOUS

## 2017-02-12 MED ORDER — CEFAZOLIN SODIUM-DEXTROSE 2-4 GM/100ML-% IV SOLN
INTRAVENOUS | Status: AC
  Filled 2017-02-12: qty 100

## 2017-02-12 MED ORDER — CEFAZOLIN SODIUM-DEXTROSE 1-4 GM/50ML-% IV SOLN
1.0000 g | Freq: Four times a day (QID) | INTRAVENOUS | Status: AC
  Administered 2017-02-12 – 2017-02-13 (×3): 1 g via INTRAVENOUS
  Filled 2017-02-12 (×4): qty 50

## 2017-02-12 MED ORDER — SODIUM CHLORIDE 0.9 % IR SOLN
Status: AC
  Filled 2017-02-12: qty 2

## 2017-02-12 MED ORDER — ACETAMINOPHEN 325 MG PO TABS: 325.0000 mg | ORAL_TABLET | ORAL | Status: DC | PRN

## 2017-02-12 MED ORDER — FENTANYL CITRATE (PF) 100 MCG/2ML IJ SOLN
INTRAMUSCULAR | Status: AC
  Filled 2017-02-12: qty 2

## 2017-02-12 MED ORDER — HYDROCHLOROTHIAZIDE 12.5 MG PO CAPS
12.5000 mg | ORAL_CAPSULE | Freq: Every day | ORAL | Status: DC
  Administered 2017-02-13: 12.5 mg via ORAL
  Filled 2017-02-12: qty 1

## 2017-02-12 MED ORDER — TICAGRELOR 90 MG PO TABS
90.0000 mg | ORAL_TABLET | Freq: Two times a day (BID) | ORAL | Status: DC
  Administered 2017-02-12 – 2017-02-13 (×2): 90 mg via ORAL
  Filled 2017-02-12 (×2): qty 1

## 2017-02-12 MED ORDER — CARVEDILOL 12.5 MG PO TABS
12.5000 mg | ORAL_TABLET | Freq: Two times a day (BID) | ORAL | Status: DC
  Administered 2017-02-12 – 2017-02-13 (×2): 12.5 mg via ORAL
  Filled 2017-02-12 (×2): qty 1

## 2017-02-12 MED ORDER — LOSARTAN POTASSIUM 50 MG PO TABS
100.0000 mg | ORAL_TABLET | Freq: Every day | ORAL | Status: DC
  Administered 2017-02-13: 100 mg via ORAL
  Filled 2017-02-12: qty 2

## 2017-02-12 MED ORDER — SODIUM CHLORIDE 0.9 % IR SOLN
80.0000 mg | Status: AC
  Administered 2017-02-12: 80 mg

## 2017-02-12 MED ORDER — CEFAZOLIN SODIUM-DEXTROSE 2-4 GM/100ML-% IV SOLN
2.0000 g | INTRAVENOUS | Status: AC
  Administered 2017-02-12: 2 g via INTRAVENOUS

## 2017-02-12 MED ORDER — ATORVASTATIN CALCIUM 80 MG PO TABS
80.0000 mg | ORAL_TABLET | Freq: Every day | ORAL | Status: DC
  Filled 2017-02-12: qty 1

## 2017-02-12 MED ORDER — ONDANSETRON HCL 4 MG/2ML IJ SOLN: 4.0000 mg | Freq: Four times a day (QID) | INTRAMUSCULAR | Status: DC | PRN

## 2017-02-12 MED ORDER — LOSARTAN POTASSIUM-HCTZ 100-12.5 MG PO TABS: 1.0000 | ORAL_TABLET | Freq: Every day | ORAL | Status: DC

## 2017-02-12 MED ORDER — ACETAMINOPHEN 325 MG PO TABS: 650.0000 mg | ORAL_TABLET | ORAL | Status: DC | PRN

## 2017-02-12 SURGICAL SUPPLY — 8 items
CABLE SURGICAL S-101-97-12 (CABLE) ×2 IMPLANT
ICD VISIA MRI VR DVFB1D4 (ICD Generator) IMPLANT
LEAD SPRINT QUAT SEC 6935M-62 (Lead) ×2 IMPLANT
PAD DEFIB LIFELINK (PAD) ×2 IMPLANT
SHEATH CLASSIC 7F (SHEATH) IMPLANT
SHEATH CLASSIC 9F (SHEATH) ×2 IMPLANT
TRAY PACEMAKER INSERTION (PACKS) ×2 IMPLANT
VISIA MRI VR DVFB1D4 (ICD Generator) ×3 IMPLANT

## 2017-02-12 NOTE — H&P (View-Only) (Signed)
Cardiology Office Note:    Date:  01/30/2017   ID:  Kevin Patel, DOB 08/29/1945, MRN [id number]  PCP:  Grisso, Greg A., MD  Cardiologist:  Flora Ratz, MD ; Jonathan Berry, MD   Referring MD: Grisso, Greg A., MD   Chief complaint: evaluate for ICD - primary prevention  History of Present Illness:    Kevin Patel is a 70 y.o. male with a hx of longstanding CAD and moderate to severe ischemic cardiomyopathy (LVEF in past as low as 20-25% before Synergy DES 4x16 to ostial LAD 09/05/2016, now improved to 30-35% by last echo 12/25/2016), with well compensated CHF (NYHA class 2) on comprehensive medical therapy (carvedilol, lisinopril, ASA/Brilinta, high dose statin, not requiring daily loop diuretic). Previously reported LV apical thrombus has not been seen on his recent echos since 2012 or his recent LV angiogram.  Dr. Berry mentioned possible transition to Entresto in his 07/25 note, but this has not yet occurred. He is still also taking amlodipine.  Comorbid conditions include HTN, HLP, DM.  He has excellent functional status. He enjoys bowling and walks on a regular basis. He takes care of his own yard work. He denies exertional angina and has minimal exertional dyspnea. He has not experienced syncope, palpitations, leg edema or claudication. NYHA functional class I-II.  Past Medical History:  Diagnosis Date  . Coronary artery disease   . Diabetes mellitus with coincident hypertension (HCC) 09/09/2016  . History of kidney stones   . HTN (hypertension)   . Hyperlipidemia   . Ischemic cardiomyopathy   . MI (myocardial infarction) (HCC) 1998   myoview 09/25/2006-no ischemia, EF 46%  . Mural thrombus of heart    on coumadin until follow up echo with contrast in Sept 13, 2012 which showed no thrombus and coumadin was d/c    Past Surgical History:  Procedure Laterality Date  . CORONARY ANGIOPLASTY  1998   PCI  . CORONARY STENT INTERVENTION N/A 09/05/2016   Procedure: Coronary  Stent Intervention;  Surgeon: Christopher D McAlhany, MD;  Location: [facility];  Service: Cardiovascular;  Laterality: N/A;  . INTRAVASCULAR ULTRASOUND/IVUS N/A 09/05/2016   Procedure: Intravascular Ultrasound/IVUS;  Surgeon: Christopher D McAlhany, MD;  Location: [facility];  Service: Cardiovascular;  Laterality: N/A;  . LEFT HEART CATH AND CORONARY ANGIOGRAPHY N/A 09/05/2016   Procedure: Left Heart Cath and Coronary Angiography;  Surgeon: Christopher D McAlhany, MD;  Location: [facility];  Service: Cardiovascular;  Laterality: N/A;    Current Medications: Current Meds  Medication Sig  . acetaminophen (TYLENOL) 325 MG tablet Take 2 tablets (650 mg total) by mouth every 4 (four) hours as needed for headache or mild pain.  . aspirin 81 MG tablet Take 81 mg by mouth every morning.   . atorvastatin (LIPITOR) 80 MG tablet TAKE ONE TABLET BY MOUTH ONCE DAILY  . carvedilol (COREG) 12.5 MG tablet Take 1 tablet (12.5 mg total) by mouth 2 (two) times daily.  . cephALEXin (KEFLEX) 500 MG capsule Take 1 capsule by mouth as directed.  . GLUCOSAMINE HCL PO Take 1 tablet by mouth daily.  . Multiple Vitamin (MULTIVITAMIN) capsule Take 1 capsule by mouth daily.  . nitroGLYCERIN (NITROSTAT) 0.4 MG SL tablet Place 1 tablet (0.4 mg total) under the tongue every 5 (five) minutes x 3 doses as needed for chest pain.  . ticagrelor (BRILINTA) 90 MG TABS tablet Take 1 tablet (90 mg total) by mouth 2 (two) times daily.  . vitamin   C (ASCORBIC ACID) 500 MG tablet Take 500 mg by mouth daily.  . vitamin E 400 UNIT capsule Take 400 Units by mouth daily.  . [DISCONTINUED] amLODipine (NORVASC) 5 MG tablet TAKE ONE TABLET BY MOUTH ONCE DAILY  . [DISCONTINUED] lisinopril-hydrochlorothiazide (PRINZIDE,ZESTORETIC) 20-12.5 MG tablet TAKE ONE TABLET BY MOUTH TWICE DAILY     Allergies:   Patient has no known allergies.   Social History   Social History  . Marital status: Married    Spouse name: N/A  .  Number of children: N/A  . Years of education: N/A   Social History Main Topics  . Smoking status: Former Smoker    Quit date: 02/12/1993  . Smokeless tobacco: Never Used  . Alcohol use No  . Drug use: No  . Sexual activity: Not Asked   Other Topics Concern  . None   Social History Narrative  . None     Family History: The patient's family history includes Emphysema in his father. ROS:   Please see the history of present illness.     All other systems reviewed and are negative.  EKGs/Labs/Other Studies Reviewed:    The following studies were reviewed today: Last echocardiogram and coronary angiograms, last note from Dr. Berry  EKG:  EKG is ordered today.  Today's ECG shows sinus bradycardia with a bigeminal rhythm (most likely PVCs, cannot exclude PACs with aberrancy), old anteroseptal infarction, mild ST segment depression and T-wave inversion in the lateral leads, narrow QRS 90 ms, QT normal at 400 ms. The ekg ordered 09/16/2016 demonstrates NSR, LAFB, old anteroseptal infarct, narrow QRS.  Recent Labs: 09/05/2016: Magnesium 1.9 09/06/2016: Hemoglobin 12.8; Platelets 172 09/16/2016: ALT 17; ALT 17; BUN 23; BUN 23; Creat 1.26; Creat 1.26; Potassium 4.1; Potassium 4.1; Sodium 138; Sodium 138  Recent Lipid Panel    Component Value Date/Time   CHOL 103 09/16/2016 0949   CHOL 121 03/10/2015 0857   TRIG 110 09/16/2016 0949   HDL 37 (L) 09/16/2016 0949   HDL 51 03/10/2015 0857   CHOLHDL 2.8 09/16/2016 0949   VLDL 22 09/16/2016 0949   LDLCALC 44 09/16/2016 0949   LDLCALC 56 03/10/2015 0857    Physical Exam:    VS:  BP 98/64   Pulse (!) 55   Ht 6' 4" (1.93 m)   Wt 245 lb (111.1 kg)   BMI 29.82 kg/m     Wt Readings from Last 3 Encounters:  01/30/17 245 lb (111.1 kg)  01/22/17 243 lb 12.8 oz (110.6 kg)  01/01/17 243 lb (110.2 kg)     GEN:  Well nourished, well developed in no acute distress HEENT: Normal NECK: No JVD; No carotid bruits LYMPHATICS: No  lymphadenopathy CARDIAC: RRR, no murmurs, rubs, gallops RESPIRATORY:  Clear to auscultation without rales, wheezing or rhonchi  ABDOMEN: Soft, non-tender, non-distended MUSCULOSKELETAL:  No edema; No deformity  SKIN: Warm and dry NEUROLOGIC:  Alert and oriented x 3 PSYCHIATRIC:  Normal affect   ASSESSMENT:    1. At risk for sudden cardiac death   2. Chronic systolic heart failure (HCC)   3. Coronary artery disease involving coronary bypass graft of native heart without angina pectoris   4. Essential hypertension   5. 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 Dyslipidemia   7. Pre-procedure lab exam   8. Medication management    PLAN:    In order of problems listed above:  1. MADIT-2:    He meets criteria for primary prevention ICD implantation for ischemic cardiomyopathy (Prior myocardial infarction, left ventricular ejection fraction under 35%, heart failure NYHA class II-III, on comprehensive medical therapy, >90 days since last revascularization). The procedure was discussed in detail, as well as possible short and long term complications, follow-up, etc. We reviewed the fact that the device itself will have no impact on heart pumping function or quality of life, but statistically may prolong his life. This procedure has been fully reviewed with the patient and written informed consent has been obtained. 2. CHF: euvolemic without loop diuretics, well compensated. 3. CAD: s/p anterior MI 1998, s/p LAD-DES 09/05/2016 4. HTN: can stop amlodipine to allow more RAAS inhibition/Entresto. For the time being will transition from lisinopril to losartan to allow an easier change to Entresto in the future. 5. DM: good control, last A1c 6.5% 6. HLP:  Excellent recent lipid profile.   Medication Adjustments/Labs and Tests Ordered: Current medicines are reviewed at length with the patient today.  Concerns  regarding medicines are outlined above.  Orders Placed This Encounter  Procedures  . Basic metabolic panel  . CBC  . Protime-INR  . EKG 12-Lead   Meds ordered this encounter  Medications  . losartan-hydrochlorothiazide (HYZAAR) 100-12.5 MG tablet    Sig: Take 1 tablet by mouth daily.    Dispense:  90 tablet    Refill:  3    Signed, Mayerly Kaman, MD  01/30/2017 11:53 AM    [facility]  

## 2017-02-12 NOTE — Progress Notes (Signed)
ICD Criteria  Current LVEF:30-35%. Within 12 months prior to implant: Yes   Heart failure history: Yes, Class I  Cardiomyopathy history: Yes, Ischemic Cardiomyopathy - Prior MI.  Atrial Fibrillation/Atrial Flutter: No.  Ventricular tachycardia history: No.  Cardiac arrest history: No.  History of syndromes with risk of sudden death: No.  Previous ICD: No.  Current ICD indication: Primary  PPM indication: No.   Class I or II Bradycardia indication present: No  Beta Blocker therapy for 3 or more months: Yes, prescribed.   Ace Inhibitor/ARB therapy for 3 or more months: Yes, prescribed.

## 2017-02-12 NOTE — Op Note (Signed)
Procedure report  Procedure performed:  1. Implantation of new single chamber cardioverter defibrillator 2. Fluoroscopy 3. Moderate sedation  Reason for procedure:  Primary prevention of sudden cardiac death Ischemic cardiomyopathy, left ventricular ejection fraction less than 30%, Heart failure NYHA class 2, on comprehensive medical therapy for over 90 days (MADIT-II)  Procedure performed by: [name], MD  Complications: None  Estimated blood loss: <10 mL  Medications administered during procedure: Ancef 2 g intravenously Lidocaine 1% 30 mL locally,  Fentanyl 25 mcg intravenously Versed 1 mg intravenously  During this procedure the patient is administered a total of Versed 1 mg and Fentanyl 25 mcg to achieve and maintain moderate conscious sedation.  The patient's heart rate, blood pressure, and oxygen saturation are monitored continuously during the procedure. The period of conscious sedation is 32 minutes, of which I was present face-to-face 100% of this time.   Device details:  Generator Med[employer]onic [place] AF model [id number] serial number [id number] Right ventricular lead [employer]onic [id number] serial number [id number]  Procedure details:  After the risks and benefits of the procedure were discussed the patient provided informed consent and was brought to the cardiac cath lab in the fasting state. The patient was prepped and draped in usual sterile fashion. Local anesthesia with 1% lidocaine was administered to to the left infraclavicular area. A 5-6 cm horizontal incision was made parallel with and 2-3 cm caudal to the left clavicle. Using electrocautery and blunt dissection a prepectoral pocket was created down to the level of the pectoralis major muscle fascia. The pocket was carefully inspected for hemostasis. An antibiotic-soaked sponge was placed in the pocket.  Under fluoroscopic guidance and using the modified Seldinger technique a single  Venipuncture was performed  to access the left subclavian vein. No difficulty was encountered accessing the vein.  A J-tip guidewires were subsequently exchanged for a 9.5 French safe sheath.  Under fluoroscopic guidance the ventricular lead was advanced to level of the mid to apical right ventricular septum and thet active-fixation helix was deployed. Prominent current of injury was seen. Satisfactory pacing and sensing parameters were recorded. There was no evidence of diaphragmatic stimulation at maximum device output. The safe sheath was peeled away and the lead was secured in place with 2-0 silk.  The antibiotic-soaked sponge was removed from the pocket. The pocket was flushed with copious amounts of antibiotic solution. Reinspection showed excellent hemostasis.  The ventricular lead was connected to the generator and appropriate ventricular pacing was seen. Telemetry testing of the lead parameters showed excellent values.  The entire system was then carefully inserted in the pocket with care been taking that the leads and device assumed a comfortable position without pressure on the incision. Great care was taken that the leads be located deep to the generator. The pocket was then closed in layers using 2 layers of 2-0 Vicryl and cutaneous staples, after which a sterile dressing was applied.  At the end of the procedure the following lead parameters were encountered:  Right ventricular lead sensed R waves 8.5 mV, impedance 589 ohms, threshold 0.75 V at 0.4 ms pulse width. High voltage impedance 75 ohm.

## 2017-02-12 NOTE — Interval H&P Note (Signed)
History and Physical Interval Note:  02/12/2017 1:39 PM  Kevin Patel  has presented today for surgery, with the diagnosis of ischemic cardiomyopathy, prevention of sudden cardiac death  The various methods of treatment have been discussed with the patient and family. After consideration of risks, benefits and other options for treatment, the patient has consented to  Procedure(s): ICD Implant (N/A) as a surgical intervention .  The patient's history has been reviewed, patient examined, no change in status, stable for surgery.  I have reviewed the patient's chart and labs.  Questions were answered to the patient's satisfaction.     Kevin Patel

## 2017-02-12 NOTE — Progress Notes (Signed)
Orthopedic Tech Progress Note Patient Details:  Glenis Michalak [facility] Aug 27, 1945 [id number] Patient has sling on. Patient ID: SYRE TRITTEN, male   DOB: [birth year], 71 y.o.   MRN: [id number]   Jennye Moccasin 02/12/2017, 6:39 PM

## 2017-02-13 ENCOUNTER — Encounter (HOSPITAL_COMMUNITY): Payer: Self-pay | Admitting: Cardiovascular Disease

## 2017-02-13 ENCOUNTER — Ambulatory Visit (HOSPITAL_COMMUNITY): Payer: Medicare Other

## 2017-02-13 DIAGNOSIS — I110 Hypertensive heart disease with heart failure: Secondary | ICD-10-CM | POA: Diagnosis not present

## 2017-02-13 DIAGNOSIS — I2510 Atherosclerotic heart disease of native coronary artery without angina pectoris: Secondary | ICD-10-CM | POA: Diagnosis not present

## 2017-02-13 DIAGNOSIS — E119 Type 2 diabetes mellitus without complications: Secondary | ICD-10-CM | POA: Diagnosis not present

## 2017-02-13 DIAGNOSIS — I255 Ischemic cardiomyopathy: Secondary | ICD-10-CM | POA: Diagnosis not present

## 2017-02-13 NOTE — Progress Notes (Signed)
Pt/family given discharge instructions, medication lists, follow up appointments, and when to call the doctor.  Pt/family verbalizes understanding. Patient given instructions for limb restrictions and activity. Thomas Hoff, RN

## 2017-02-13 NOTE — Discharge Instructions (Signed)

## 2017-02-13 NOTE — Discharge Summary (Signed)
ELECTROPHYSIOLOGY PROCEDURE DISCHARGE SUMMARY    Patient ID: Kevin Patel,  MRN: [id number], DOB/AGE: May 02, 1946 71 y.o.  Admit date: 02/12/2017 Discharge date: 02/13/2017  Primary Care Physician: [name]., MD Primary Cardiologist: Berry/Croitoru  Primary Discharge Diagnosis:  ICM s/p ICD implant this admission  Secondary Discharge Diagnosis:  1.  CAD 2.  HTN 3.  Hyperlipidemia 4.  Diabetes  No Known Allergies   Procedures This Admission:  1.  Implantation of a MDT single chamber ICD on 02/12/17 by Dr Royann Shivers.  The patient received a MDT model number Visia AF ICD with model number 6935 right ventricular lead. There were no immediate post procedure complications. 2.  CXR on 02/13/17 demonstrated no pneumothorax status post device implantation.   Brief HPI: Kevin Patel is a 71 y.o. male was referred to Dr C in the outpatient setting for consideration of ICD implantation.  Past medical history includes CAD, ICM, HTN, diabetes.  The patient has persistent LV dysfunction despite guideline directed therapy.  Risks, benefits, and alternatives to ICD implantation were reviewed with the patient who wished to proceed.   Hospital Course:  The patient was admitted and underwent implantation of a MDT single chamber ICD with details as outlined above.  Left chest was without hematoma or ecchymosis.  The device was interrogated and found to be functioning normally.  CXR was obtained and demonstrated no pneumothorax status post device implantation.  Wound care, arm mobility, and restrictions were reviewed with the patient.  The patient was examined and considered stable for discharge to home.   The patient's discharge medications include an ARB (Losartan) and beta blocker (Coreg).   Physical Exam: Vitals:   02/13/17 0400 02/13/17 0600 02/13/17 0700 02/13/17 0800  BP: 107/76   140/89  Pulse: (!) 51 (!) 48 (!) 49 (!) 58  Resp: Temp: 97.8 F (36.6 C)   98.1 F (36.7 C)   TempSrc: Oral   Oral  SpO2: 96% 96% 96% 99%  Weight:      Height:        Labs:   Lab Results  Component Value Date   WBC 7.4 02/06/2017   HGB 13.9 02/06/2017   HCT 39.9 02/06/2017   MCV 97 02/06/2017   PLT 206 02/06/2017     Recent Labs Lab 02/06/17 1030  NA 144  K 4.3  CL 106  CO2 23  BUN 23  CREATININE 1.16  CALCIUM 9.6  GLUCOSE 120*    Discharge Medications:  Allergies as of 02/13/2017   No Known Allergies     Medication List    TAKE these medications   acetaminophen 325 MG tablet Commonly known as:  TYLENOL Take 2 tablets (650 mg total) by mouth every 4 (four) hours as needed for headache or mild pain.   aspirin 81 MG tablet Take 81 mg by mouth every morning.   atorvastatin 80 MG tablet Commonly known as:  LIPITOR TAKE ONE TABLET BY MOUTH ONCE DAILY   carvedilol 12.5 MG tablet Commonly known as:  COREG Take 1 tablet (12.5 mg total) by mouth 2 (two) times daily.   GLUCOSAMINE HCL PO Take 1 tablet by mouth daily.   losartan-hydrochlorothiazide 100-12.5 MG tablet Commonly known as:  HYZAAR Take 1 tablet by mouth daily.   multivitamin capsule Take 1 capsule by mouth daily.   nitroGLYCERIN 0.4 MG SL tablet Commonly known as:  NITROSTAT Place 1 tablet (0.4 mg total) under the tongue every 5 (  five) minutes x 3 doses as needed for chest pain.   omega-3 acid ethyl esters 1 g capsule Commonly known as:  LOVAZA Take 1 g by mouth daily.   ticagrelor 90 MG Tabs tablet Commonly known as:  BRILINTA Take 1 tablet (90 mg total) by mouth 2 (two) times daily.   vitamin C 500 MG tablet Commonly known as:  ASCORBIC ACID Take 500 mg by mouth daily.   vitamin E 400 UNIT capsule Take 400 Units by mouth daily.            Discharge Care Instructions        Start     Ordered   02/13/17 0000  Increase activity slowly     02/13/17 1015   02/13/17 0000  Diet - low sodium heart healthy     02/13/17 1015      Disposition:  Discharge Instructions     Diet - low sodium heart healthy    Complete by:  As directed    Increase activity slowly    Complete by:  As directed      Follow-up Information    [facility] Follow up on 02/26/2017.   Specialty:  Cardiology Why:  at 12Noon Contact information: [address], [address] [place] [address] [phone]       [name], MD Follow up on 05/15/2017.   Specialty:  Cardiology Why:  at 8:20AM Contact information: [address] [place] [address] [phone]           Duration of Discharge Encounter: Greater than 30 minutes including physician time.  Signed, Gypsy Balsam, NP 02/13/2017 10:15 AM

## 2017-02-13 NOTE — Progress Notes (Signed)
Progress Note  Patient Name: Kevin Patel Date of Encounter: 02/13/2017  Primary Cardiologist: Allyson Sabal  Subjective   Mildly sore at surgical site. Small ecchymosis caudal to pocket, minimal staining on dressing. Pressure dressing removed.  Inpatient Medications    Scheduled Meds: . aspirin EC  81 mg Oral [id number]  . atorvastatin  80 mg Oral Daily  . carvedilol  12.5 mg Oral BID  . losartan  100 mg Oral Daily   And  . hydrochlorothiazide  12.5 mg Oral Daily  . sodium chloride flush  3 mL Intravenous Q12H  . ticagrelor  90 mg Oral BID   Continuous Infusions: . sodium chloride    .  ceFAZolin (ANCEF) IV Stopped (02/13/17 0311)   PRN Meds: sodium chloride, acetaminophen, HYDROcodone-acetaminophen, nitroGLYCERIN, ondansetron (ZOFRAN) IV, sodium chloride flush   Vital Signs    Vitals:   02/13/17 0400 02/13/17 0600 02/13/17 0700 02/13/17 0800  BP: 107/76   140/89  Pulse: (!) 51 (!) 48 (!) 49 (!) 58  Resp: Temp: 97.8 F (36.6 C)   98.1 F (36.7 C)  TempSrc: Oral   Oral  SpO2: 96% 96% 96% 99%  Weight:      Height:        Intake/Output Summary (Last 24 hours) at 02/13/17 0859 Last data filed at 02/13/17 0734  Gross per 24 hour  Intake                0 ml  Output             1625 ml  Net            -1625 ml   Filed Weights   02/12/17 1126  Weight: 243 lb (110.2 kg)    Telemetry    NSR, occ PVCs - Personally Reviewed  ECG    NSR, PRWP, no new repol changes - Personally Reviewed  Physical Exam  Calm, relaxed GEN: No acute distress.   Neck: No JVD Cardiac: RRR, no murmurs, rubs, or gallops. Small ecchymosis beneath the ICD pocket site, Respiratory: Clear to auscultation bilaterally. GI: Soft, nontender, non-distended  MS: No edema; No deformity. Neuro:  Nonfocal  Psych: Normal affect   Labs    Chemistry Recent Labs Lab 02/06/17 1030  NA 144  K 4.3  CL 106  CO2 23  GLUCOSE 120*  BUN 23  CREATININE 1.16  CALCIUM 9.6  GFRNONAA 63    GFRAA 73     Hematology Recent Labs Lab 02/06/17 1030  WBC 7.4  RBC 4.10*  HGB 13.9  HCT 39.9  MCV 97  MCH 33.9*  MCHC 34.8  RDW 12.1*  PLT 206    Cardiac EnzymesNo results for input(s): TROPONINI in the last 168 hours. No results for input(s): TROPIPOC in the last 168 hours.   BNPNo results for input(s): BNP, PROBNP in the last 168 hours.   DDimer No results for input(s): DDIMER in the last 168 hours.   Radiology    Dg Chest 2 View  Result Date: 02/13/2017 CLINICAL DATA:  Status post defibrillator placement. EXAM: CHEST  2 VIEW COMPARISON:  Radiographs of September 04, 2016. FINDINGS: The heart size and mediastinal contours are within normal limits. Interval placement of left-sided single lead defibrillator is noted in grossly good position. No pneumothorax or pleural effusion is noted. Mild bibasilar subsegmental atelectasis or scarring is noted. Degenerative change of right shoulder is noted. IMPRESSION: Mild bibasilar subsegmental atelectasis or scarring. Interval placement of  left-sided defibrillator in grossly good position. No pneumothorax is noted. Electronically Signed   By: Lupita Raider, M.D.   On: 02/13/2017 08:35    Cardiac Studies   Device check with normal function. R waves 8.3 mV, impedance 532 ohm, threshold 0.5V @0 .4 ms  Patient Profile     71 y.o. male with ischemic cardiomyopathy s/p implantation of primary prevention single lead ICD.  Assessment & Plan    Wound looks healthy, expected ecchymosis due to antiplatelet therapy. CXR with good lead position and no pneumothorax. Good lead parameters. Activity restrictions reviewed. Wound care instructions given. "Shock plan" discussed. DC home. Wound check in 7-8 days for staple removal. Office visit with me in 90 days.  Signed, [name], MD  02/13/2017, 8:59 AM

## 2017-02-19 ENCOUNTER — Ambulatory Visit (INDEPENDENT_AMBULATORY_CARE_PROVIDER_SITE_OTHER): Payer: Medicare Other | Admitting: Pharmacist Clinician (PhC)/ Clinical Pharmacy Specialist

## 2017-02-19 ENCOUNTER — Encounter: Payer: Self-pay | Admitting: Pharmacist Clinician (PhC)/ Clinical Pharmacy Specialist

## 2017-02-19 DIAGNOSIS — I255 Ischemic cardiomyopathy: Secondary | ICD-10-CM | POA: Diagnosis not present

## 2017-02-19 NOTE — Assessment & Plan Note (Signed)
Patient with ischemic cardiomyopathy, most recent EF at 30-35%.  Patient feeling well with no complaints today.  Because of low heart rate and well controlled BP, I will not make any changes or try to increase the carvedilol at this time.  He will follow up with Drs. Croitoru and Allyson Sabal over the next few months and they can determine if he would benefit from a switch from losartan to [place].

## 2017-02-19 NOTE — Patient Instructions (Signed)
Your blood pressure today is 118/70  (goal is <130/80)   Check your blood pressure at home several days each week and keep record of the readings.  Take your BP meds as follows:  Continue with all current medications  Bring all of your meds, your BP cuff and your record of home blood pressures to your next appointment.  Exercise as you're able, try to walk approximately 30 minutes per day.  Keep salt intake to a minimum, especially watch canned and prepared boxed foods.  Eat more fresh fruits and vegetables and fewer canned items.  Avoid eating in fast food restaurants.    HOW TO TAKE YOUR BLOOD PRESSURE: . Rest 5 minutes before taking your blood pressure. .  Don't smoke or drink caffeinated beverages for at least 30 minutes before. . Take your blood pressure before (not after) you eat. . Sit comfortably with your back supported and both feet on the floor (don't cross your legs). . Elevate your arm to heart level on a table or a desk. . Use the proper sized cuff. It should fit smoothly and snugly around your bare upper arm. There should be enough room to slip a fingertip under the cuff. The bottom edge of the cuff should be 1 inch above the crease of the elbow. . Ideally, take 3 measurements at one sitting and record the average.

## 2017-02-19 NOTE — Progress Notes (Signed)
Patient ID: Kevin Patel                 DOB: November 25, 1945                      MRN: [id number]     HPI: Kevin Patel is a 71 y.o. male referred by Dr. Allyson Sabal to HTN clinic for titration of beta blocker. PMH includes ischemic cardiomyopathy, hx of MI in 1998 s/p stent placement and EF 30-35%. Patient metoprolol was changed to carvedilol 6.26mg  twice daily on 01/01/2017 and increased to 12.5 mg twice daily on August 15.  Since then he was hospitalized on September 5, overnight, for placement of ICD.  Days before the procedure, his lisinopril hctz 20/12.5 was switched to losartan hctz 100/12.5, and amlodipine 5 mg discontinued.  The ACEI to ARB switch was noted to be such that if it is decided he should be on Entresto, the conversion will be easier.    Current HTN meds:  Carvedilol 12.5mg  twice daily Losartan/HCTZ 100-12.5mg  daily  Previously tried:  Metoprolol 25mg  twice daily Amlodipine 5 mg - d/c due to better BP control  BP goal: <130/80  Social History: former smoker quit in 1994, denies smokeless tobacco, denies alcohol or drug use; 1 coffee per day, some soda most decaf/diet sodas  Diet: low sodium but no special diet- no added salt; rare eat out (maybe every 2 weeks); some rice/potatoes; mostly hamburger, some fresh f/v (banans, fruit cups); garden veggies  Exercise: 3 times per week for 30 minutes (exercise bike and treadmill) on hold for recovery from implant  Home BP readings: 33 readings over past 3 weeks ; average BP - AM 124/82, PM 125/82; pulse 53-70  Reli-On (Omron) cuff -  Not more than 80-17 years old    Wt Readings from Last 3 Encounters:  02/12/17 243 lb (110.2 kg)  01/30/17 245 lb (111.1 kg)  01/22/17 243 lb 12.8 oz (110.6 kg)   BP Readings from Last 3 Encounters:  02/19/17 118/70  02/13/17 140/89  01/30/17 98/64   Pulse Readings from Last 3 Encounters:  02/19/17 (!) 56  02/13/17 63  01/30/17 (!) 55    Past Medical History:  Diagnosis Date  . AICD (automatic  cardioverter/defibrillator) present 02/12/2017  . Chronic systolic HF (heart failure) (HCC)   . Coronary artery disease   . Diabetes mellitus with coincident hypertension (HCC) 09/09/2016  . History of kidney stones   . HTN (hypertension)   . Hyperlipidemia   . Ischemic cardiomyopathy   . MI (myocardial infarction) (HCC) 1998   myoview 09/25/2006-no ischemia, EF 46%  . [place] thrombus of heart    on coumadin until follow up echo with contrast in Sept 13, 2012 which showed no thrombus and coumadin was d/c    Current Outpatient Prescriptions on File Prior to Visit  Medication Sig Dispense Refill  . acetaminophen (TYLENOL) 325 MG tablet Take 2 tablets (650 mg total) by mouth every 4 (four) hours as needed for headache or mild pain.    Marland Kitchen aspirin 81 MG tablet Take 81 mg by mouth every morning.     Marland Kitchen atorvastatin (LIPITOR) 80 MG tablet TAKE ONE TABLET BY MOUTH ONCE DAILY 90 tablet 3  . carvedilol (COREG) 12.5 MG tablet Take 1 tablet (12.5 mg total) by mouth 2 (two) times daily. 60 tablet 5  . GLUCOSAMINE HCL PO Take 1 tablet by mouth daily.    Marland Kitchen losartan-hydrochlorothiazide (HYZAAR) 100-12.5 MG tablet Take 1  tablet by mouth daily. 90 tablet 3  . Multiple Vitamin (MULTIVITAMIN) capsule Take 1 capsule by mouth daily.    . nitroGLYCERIN (NITROSTAT) 0.4 MG SL tablet Place 1 tablet (0.4 mg total) under the tongue every 5 (five) minutes x 3 doses as needed for chest pain. 25 tablet 2  . omega-3 acid ethyl esters (LOVAZA) 1 g capsule Take 1 g by mouth daily.    . ticagrelor (BRILINTA) 90 MG TABS tablet Take 1 tablet (90 mg total) by mouth 2 (two) times daily. 180 tablet 3  . vitamin C (ASCORBIC ACID) 500 MG tablet Take 500 mg by mouth daily.    . vitamin E 400 UNIT capsule Take 400 Units by mouth daily.     No current facility-administered medications on file prior to visit.     No Known Allergies  Blood pressure 118/70, pulse (!) 56.  Ischemic cardiomyopathy Patient with ischemic cardiomyopathy,  most recent EF at 30-35%.  Patient feeling well with no complaints today.  Because of low heart rate and well controlled BP, I will not make any changes or try to increase the carvedilol at this time.  He will follow up with Drs. Croitoru and Allyson Sabal over the next few months and they can determine if he would benefit from a switch from losartan to [place].     Raquel Rodriguez-Guzman PharmD, BCPS [facility] [address] [place] [address] 02/19/2017 2:49 PM

## 2017-02-26 ENCOUNTER — Ambulatory Visit (INDEPENDENT_AMBULATORY_CARE_PROVIDER_SITE_OTHER): Payer: Medicare Other | Admitting: *Deleted

## 2017-02-26 DIAGNOSIS — I255 Ischemic cardiomyopathy: Secondary | ICD-10-CM | POA: Diagnosis not present

## 2017-02-26 DIAGNOSIS — Z95810 Presence of automatic (implantable) cardiac defibrillator: Secondary | ICD-10-CM | POA: Diagnosis not present

## 2017-02-26 LAB — CUP PACEART INCLINIC DEVICE CHECK
Brady Statistic RV Percent Paced: 0.01 %
Date Time Interrogation Session: 20180919133707
HighPow Impedance: 60 Ohm
Implantable Lead Implant Date: 20180905
Implantable Lead Location: 753860
Lead Channel Impedance Value: 456 Ohm
Lead Channel Sensing Intrinsic Amplitude: 9.875 mV
Lead Channel Setting Pacing Amplitude: 3.5 V
MDC IDC MSMT BATTERY REMAINING LONGEVITY: 138 mo
MDC IDC MSMT BATTERY VOLTAGE: 3.16 V
MDC IDC MSMT LEADCHNL RV IMPEDANCE VALUE: 361 Ohm
MDC IDC MSMT LEADCHNL RV PACING THRESHOLD AMPLITUDE: 0.75 V
MDC IDC MSMT LEADCHNL RV PACING THRESHOLD PULSEWIDTH: 0.4 ms
MDC IDC PG IMPLANT DT: 20180905
MDC IDC SET LEADCHNL RV PACING PULSEWIDTH: 0.4 ms
MDC IDC SET LEADCHNL RV SENSING SENSITIVITY: 0.3 mV

## 2017-02-26 NOTE — Progress Notes (Signed)
Wound check appointment. Staples removed. Wound without redness or edema. Incision edges approximated, wound well healed. Normal device function. Thresholds, sensing, and impedances consistent with implant measurements. Device programmed at 3.5V for extra safety margin until 3 month visit. Histogram distribution appropriate for patient and level of activity. No AF or ventricular arrhythmias noted. Max RV pacing impedance increased to 2000ohms per clinic protocol. Patient educated about wound care, arm mobility, lifting restrictions, shock plan, and Carelink monitor. ROV with [facility] on 05/15/17.

## 2017-03-11 ENCOUNTER — Other Ambulatory Visit: Payer: Self-pay | Admitting: Cardiovascular Disease

## 2017-04-22 IMAGING — CR DG CHEST 2V
2 series · 2 of 2 positions shown · non-contrast
Comparison: 09/15/2015

CLINICAL DATA: Central chest pain beginning earlier today.

EXAM:
CHEST  2 VIEW

[chest pa]
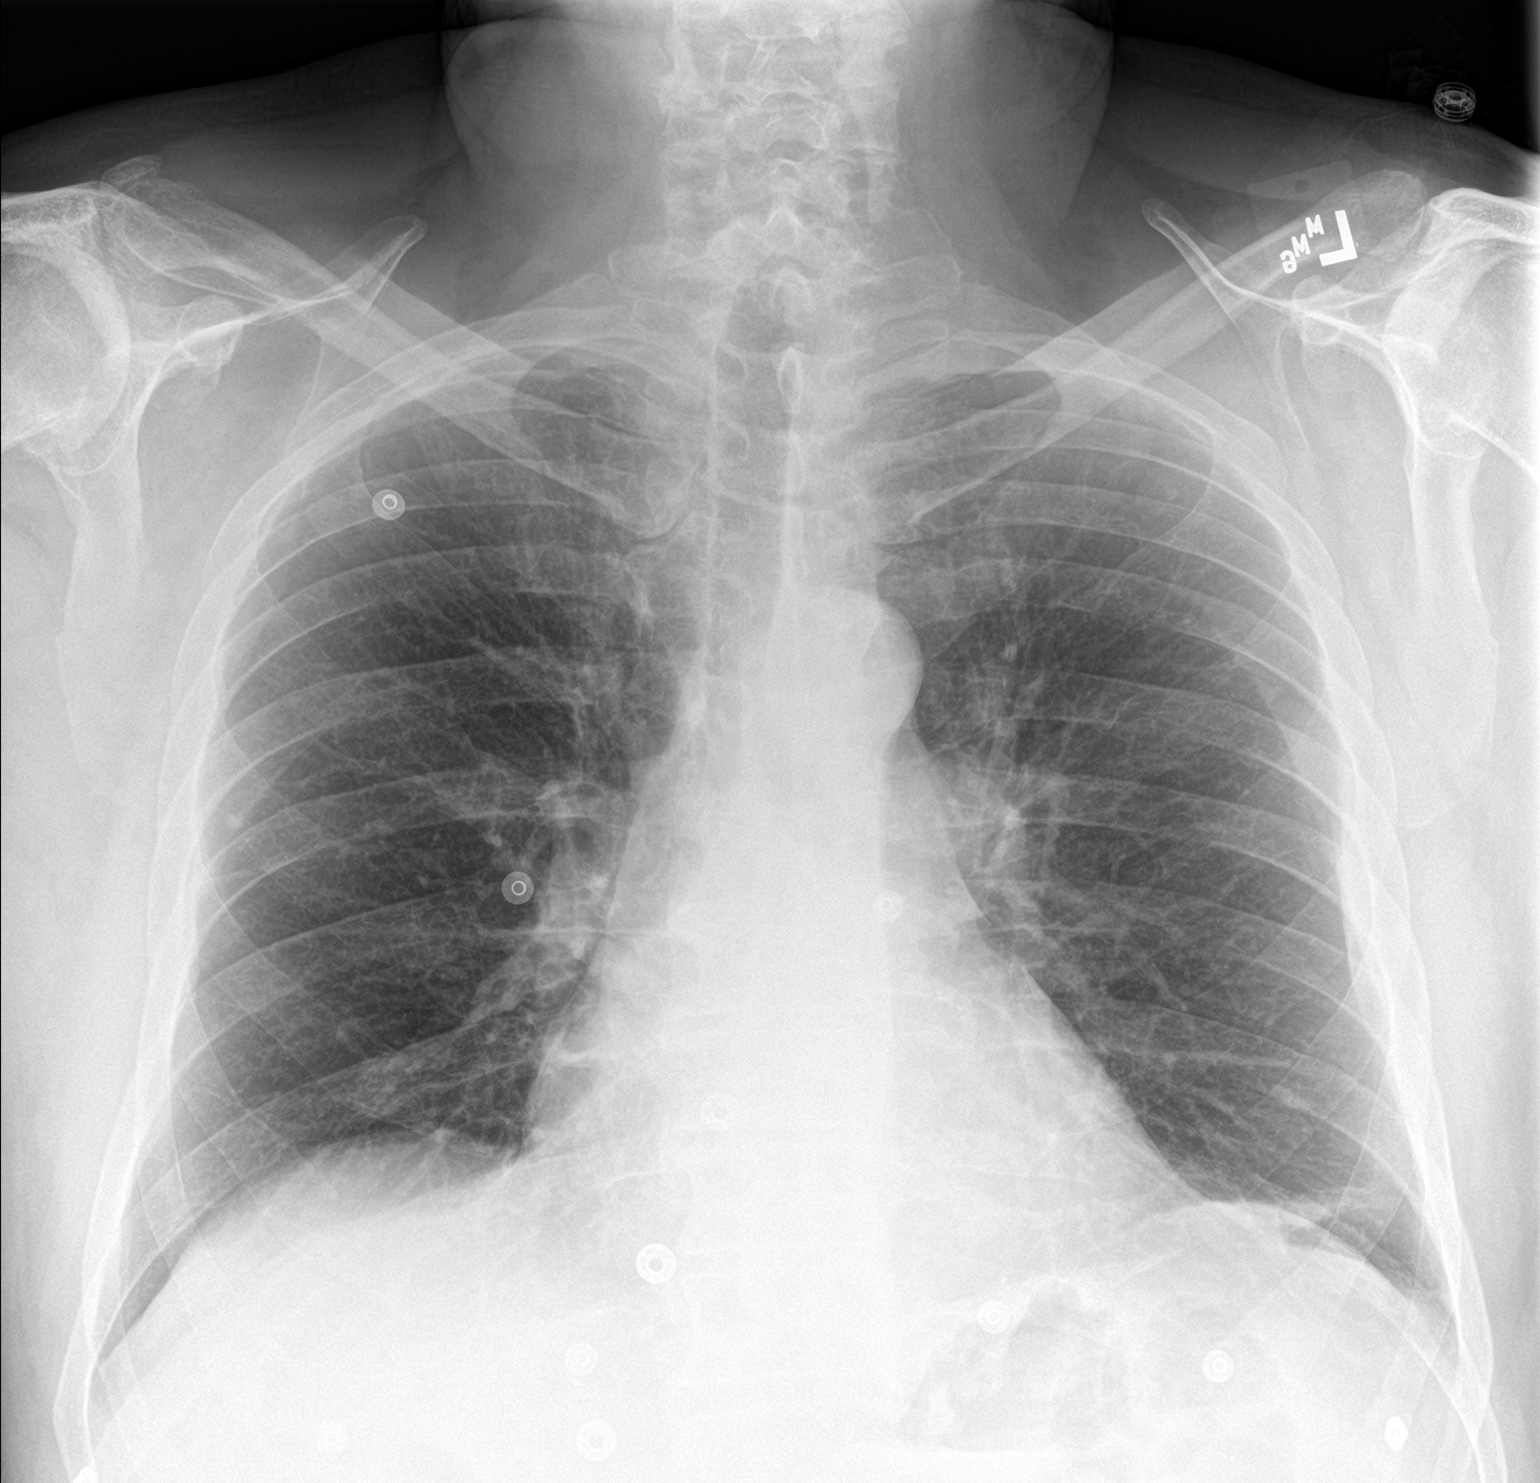

[chest lat]
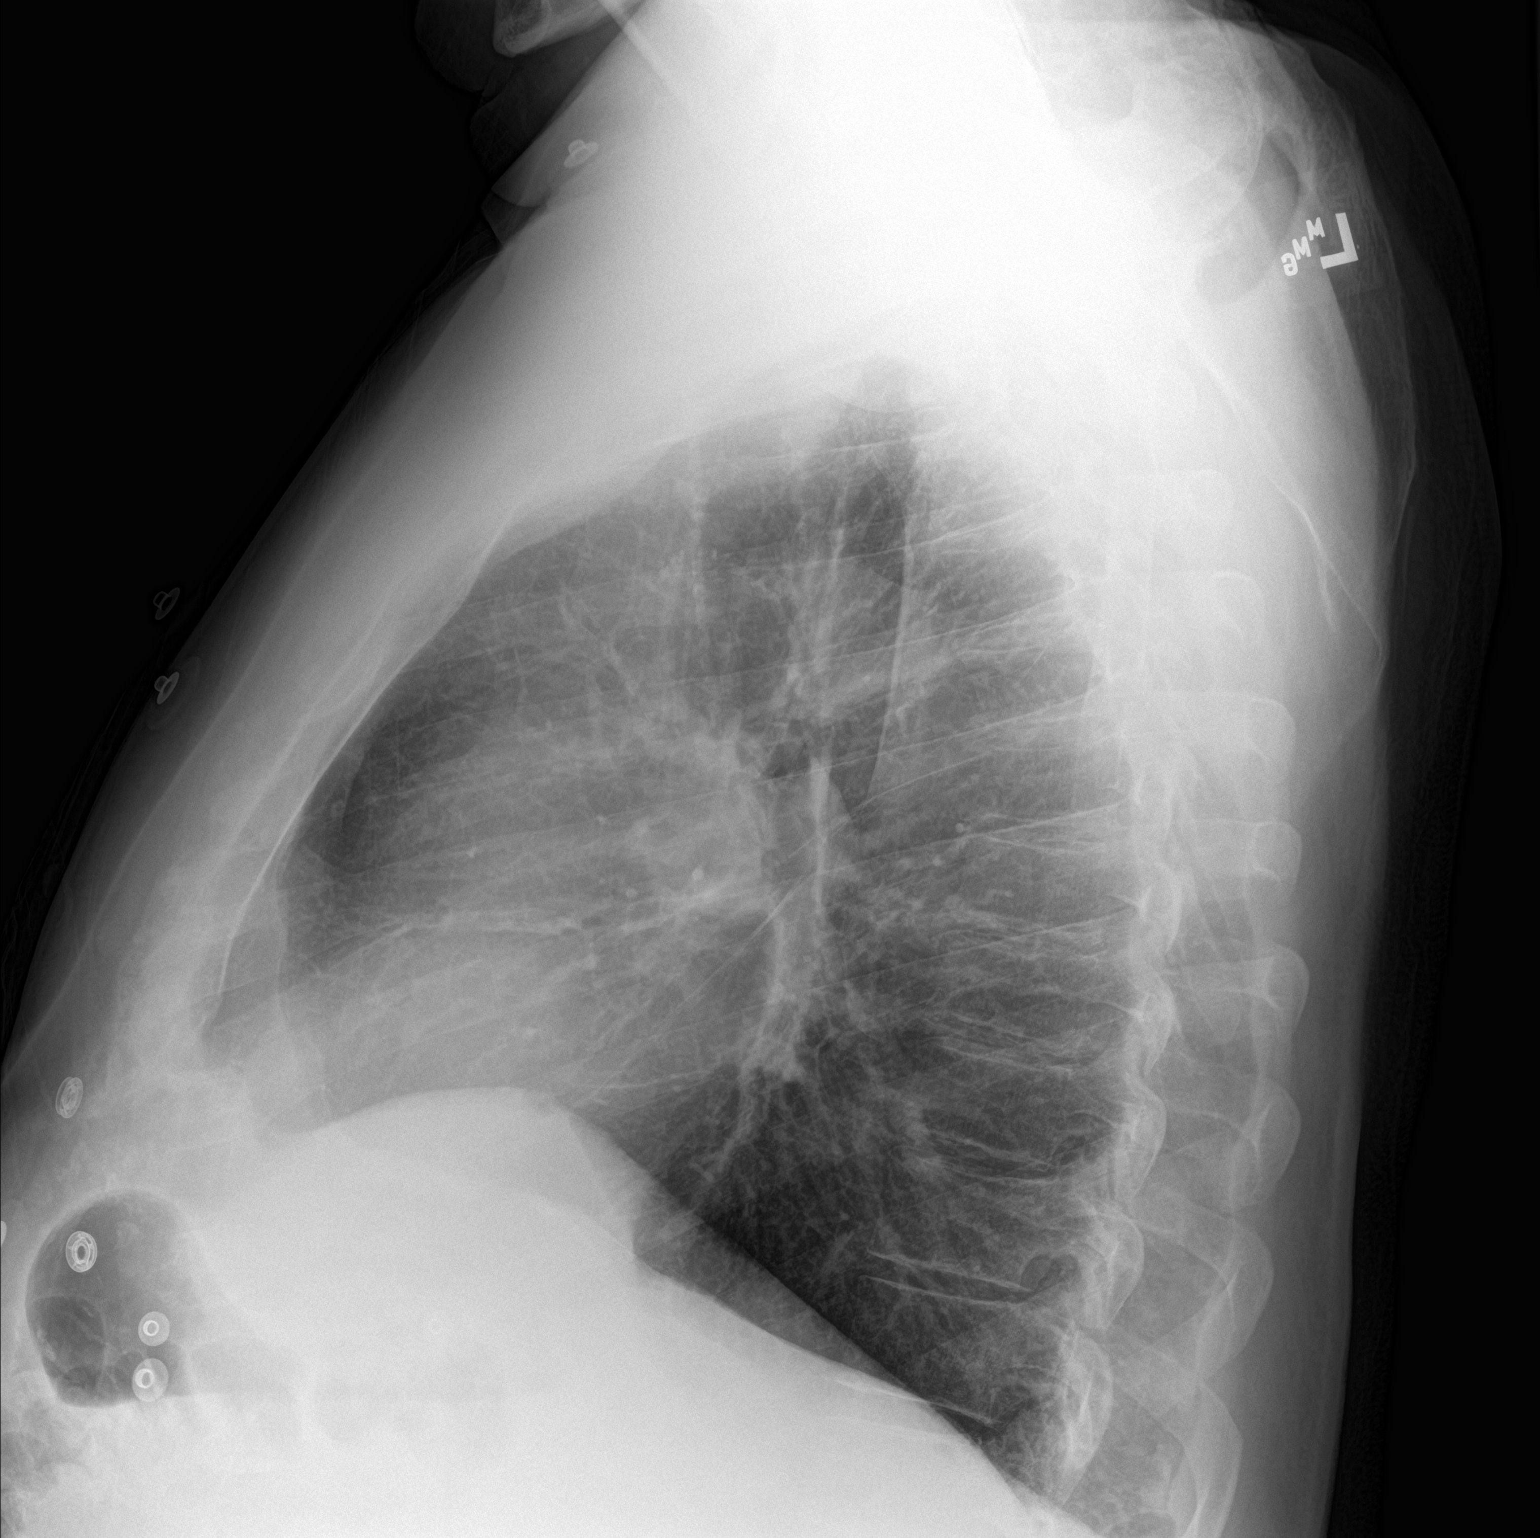

[2 of 2 positions shown; findings below may reference images not displayed]

FINDINGS: Cardiac silhouette is top-normal in size. There is no aortic
aneurysm. No pulmonary consolidations noted. There is left basilar
scarring. Rounded density overlying the anterior fourth rib may
represent a bone island or calcified granuloma. This is unchanged in
appearance. Similar finding in the left upper lobe with more oblong
density overlying the posterior left ninth rib which is more likely
a bone island or summation of vascular and rib shadows.
IMPRESSION: No active cardiopulmonary disease.  Scarring at the left lung base.

## 2017-05-15 ENCOUNTER — Encounter: Payer: Medicare Other | Admitting: Cardiovascular Disease

## 2017-07-24 ENCOUNTER — Other Ambulatory Visit: Payer: Self-pay | Admitting: Cardiovascular Disease

## 2017-07-24 NOTE — Telephone Encounter (Signed)
REFILL 

## 2017-08-06 ENCOUNTER — Ambulatory Visit (INDEPENDENT_AMBULATORY_CARE_PROVIDER_SITE_OTHER): Payer: Medicare Other | Admitting: Cardiovascular Disease

## 2017-08-06 ENCOUNTER — Encounter: Payer: Self-pay | Admitting: Cardiovascular Disease

## 2017-08-06 VITALS — BP 114/71 | HR 57 | Ht 76.0 in | Wt 250.0 lb

## 2017-08-06 DIAGNOSIS — E669 Obesity, unspecified: Secondary | ICD-10-CM | POA: Diagnosis not present

## 2017-08-06 DIAGNOSIS — I255 Ischemic cardiomyopathy: Secondary | ICD-10-CM

## 2017-08-06 DIAGNOSIS — I2510 Atherosclerotic heart disease of native coronary artery without angina pectoris: Secondary | ICD-10-CM

## 2017-08-06 DIAGNOSIS — E1169 Type 2 diabetes mellitus with other specified complication: Secondary | ICD-10-CM

## 2017-08-06 DIAGNOSIS — Z95810 Presence of automatic (implantable) cardiac defibrillator: Secondary | ICD-10-CM | POA: Diagnosis not present

## 2017-08-06 DIAGNOSIS — E7800 Pure hypercholesterolemia, unspecified: Secondary | ICD-10-CM

## 2017-08-06 DIAGNOSIS — I5022 Chronic systolic (congestive) heart failure: Secondary | ICD-10-CM | POA: Diagnosis not present

## 2017-08-06 DIAGNOSIS — I10 Essential (primary) hypertension: Secondary | ICD-10-CM

## 2017-08-06 NOTE — Patient Instructions (Signed)
Dr Royann Shivers has recommended making the following medication changes: 1. STOP Brilinta on 09/05/17  Remote monitoring is used to monitor your Pacemaker or ICD from home. This monitoring reduces the number of office visits required to check your device to one time per year. It allows [place] to keep an eye on the functioning of your device to ensure it is working properly. You are scheduled for a device check from home on Wednesday, May 29th, 2019. You may send your transmission at any time that day. If you have a wireless device, the transmission will be sent automatically. After your physician reviews your transmission, you will receive a notification with your next transmission date.  Dr Royann Shivers recommends that you schedule a follow-up appointment in 12 months with an ICD check. You will receive a reminder letter in the mail two months in advance. If you don't receive a letter, please call our office to schedule the follow-up appointment.  If you need a refill on your cardiac medications before your next appointment, please call your pharmacy.

## 2017-08-06 NOTE — Progress Notes (Signed)
Cardiology Office Note:    Date:  08/07/2017   ID:  [name], DOB 11/05/1945, MRN [id number]  PCP:  Kevin Patel., MD  Cardiologist:  Kevin Fair, MD ; Nanetta Batty, MD   Referring MD: Kevin Patel., MD   Chief complaint: f/u ICD   History of Present Illness:    Kevin Patel is a 72 y.o. 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 Previously reported LV apical thrombus has not been seen on his recent echos since 2012 or his recent LV angiogram.  He returns for follow-up roughly 5 months after implantation of a defibrillator for primary prevention.  His device is a single lead Medtronic VISIA AF device, MRI conditional.  The lead is a Medtronic [id number]  He feels great.  He no longer even notices the device he continues to bowl regularly and participates in a championship league.  He does all his own yard work.  He denies exertional angina dizziness or palpitations.  He has mild exertional dyspnea that does not really interfere with daily activities.  He has not aspirin syncope and has not had any defibrillator discharges.  He denies leg edema or intermittent claudication.  Device interrogation shows normal function.  The estimated generator longevity is 11 years.  He never requires ventricular pacing.  He has occasional episodes of paroxysmal atrial tachycardia with 1: 1 AV conduction and very rare episodes of nonsustained VT maximum 7 beats.  The heart rate histogram distribution is normal.  His thoracic impedance has been very stable.   Past Medical History:  Diagnosis Date  . AICD (automatic cardioverter/defibrillator) present 02/12/2017  . Chronic systolic HF (heart failure) (HCC)   . Coronary artery disease   . Diabetes mellitus with coincident hypertension (HCC) 09/09/2016  . History of kidney stones   . HTN  (hypertension)   . Hyperlipidemia   . Ischemic cardiomyopathy   . MI (myocardial infarction) (HCC) 1998   myoview 09/25/2006-no ischemia, EF 46%  . Mural thrombus of heart    on coumadin until follow up echo with contrast in Sept 13, 2012 which showed no thrombus and coumadin was d/c    Past Surgical History:  Procedure Laterality Date  . CORONARY ANGIOPLASTY  1998   PCI  . CORONARY STENT INTERVENTION N/A 09/05/2016   Procedure: Coronary Stent Intervention;  Surgeon: [name], MD;  Location: [facility] INVASIVE CV LAB;  Service: Cardiovascular;  Laterality: N/A;  . ICD IMPLANT N/A 02/12/2017   Procedure: ICD Implant;  Surgeon: Kevin Fair, MD;  Location: [facility] INVASIVE CV LAB;  Service: Cardiovascular;  Laterality: N/A;  . INTRAVASCULAR ULTRASOUND/IVUS N/A 09/05/2016   Procedure: Intravascular Ultrasound/IVUS;  Surgeon: [name], MD;  Location: [facility] INVASIVE CV LAB;  Service: Cardiovascular;  Laterality: N/A;  . LEFT HEART CATH AND CORONARY ANGIOGRAPHY N/A 09/05/2016   Procedure: Left Heart Cath and Coronary Angiography;  Surgeon: [name], MD;  Location: [facility] INVASIVE CV LAB;  Service: Cardiovascular;  Laterality: N/A;    Current Medications: Current Meds  Medication Sig  . acetaminophen (TYLENOL) 325 MG tablet Take 2 tablets (650 mg total) by mouth every 4 (four) hours as needed for headache or mild pain.  Marland Kitchen aspirin 81 MG tablet Take 81 mg by mouth every morning.   Marland Kitchen atorvastatin (LIPITOR) 80 MG tablet TAKE ONE TABLET BY MOUTH  ONCE DAILY  . carvedilol (COREG) 12.5 MG tablet TAKE 1 TABLET BY MOUTH TWICE DAILY  . GLUCOSAMINE HCL PO Take 1 tablet by mouth daily.  Marland Kitchen losartan-hydrochlorothiazide (HYZAAR) 100-12.5 MG tablet Take 1 tablet by mouth daily.  . Multiple Vitamin (MULTIVITAMIN) capsule Take 1 capsule by mouth daily.  . nitroGLYCERIN (NITROSTAT) 0.4 MG SL tablet Place 1 tablet (0.4 mg total) under the tongue every 5 (five) minutes x 3 doses as needed for  chest pain.  . ticagrelor (BRILINTA) 90 MG TABS tablet Take 1 tablet (90 mg total) by mouth 2 (two) times daily.  . vitamin C (ASCORBIC ACID) 500 MG tablet Take 500 mg by mouth daily.  . vitamin E 400 UNIT capsule Take 400 Units by mouth daily.     Allergies:   Patient has no known allergies.   Social History   Socioeconomic History  . Marital status: Married    Spouse name: None  . Number of children: None  . Years of education: None  . Highest education level: None  Social Needs  . Financial resource strain: None  . Food insecurity - worry: None  . Food insecurity - inability: None  . Transportation needs - medical: None  . Transportation needs - non-medical: None  Occupational History  . None  Tobacco Use  . Smoking status: Former Smoker    Last attempt to quit: 02/12/1993    Years since quitting: 24.4  . Smokeless tobacco: Never Used  Substance and Sexual Activity  . Alcohol use: No  . Drug use: No  . Sexual activity: None  Other Topics Concern  . None  Social History Narrative  . None     Family History: The patient's family history includes Emphysema in his father. ROS:   Please see the history of present illness.     All other systems reviewed and are negative.  EKGs/Labs/Other Studies Reviewed:    The following studies were reviewed today: Last echocardiogram and coronary angiograms, last note from Dr. Allyson Sabal  EKG:  EKG is ordered today.  It shows sinus bradycardia with left axis deviation and QS pattern consistent with old anteroseptal infarction in V1 V2, negative T waves in the lateral leads unchanged from previous tracing, QTC 430 ms   Recent Labs: 09/05/2016: Magnesium 1.9 09/16/2016: ALT 17; ALT 17 02/06/2017: BUN 23; Creatinine, Ser 1.16; Hemoglobin 13.9; Platelets 206; Potassium 4.3; Sodium 144  Recent Lipid Panel    Component Value Date/Time   CHOL 103 09/16/2016 0949   CHOL 121 03/10/2015 0857   TRIG 110 09/16/2016 0949   HDL 37 (L) 09/16/2016  0949   HDL 51 03/10/2015 0857   CHOLHDL 2.8 09/16/2016 0949   VLDL 22 09/16/2016 0949   LDLCALC 44 09/16/2016 0949   LDLCALC 56 03/10/2015 0857    Physical Exam:    VS:  BP 114/71   Pulse (!) 57   Ht 6\' 4"  (1.93 m)   Wt 250 lb (113.4 kg)   BMI 30.43 kg/m     Wt Readings from Last 3 Encounters:  08/06/17 250 lb (113.4 kg)  02/12/17 243 lb (110.2 kg)  01/30/17 245 lb (111.1 kg)     General: Alert, oriented x3, no distress, no healed left subclavian defibrillator site Head: no evidence of trauma, PERRL, EOMI, no exophtalmos or lid lag, no myxedema, no xanthelasma; normal ears, nose and oropharynx Neck: normal jugular venous pulsations and no hepatojugular reflux; brisk carotid pulses without delay and no carotid bruits Chest: clear to  auscultation, no signs of consolidation by percussion or palpation, normal fremitus, symmetrical and full respiratory excursions Cardiovascular: Lateral displacement of the apical impulse, regular rhythm, normal first and second heart sounds, no murmurs, rubs or gallops Abdomen: no tenderness or distention, no masses by palpation, no abnormal pulsatility or arterial bruits, normal bowel sounds, no hepatosplenomegaly Extremities: no clubbing, cyanosis or edema; 2+ radial, ulnar and brachial pulses bilaterally; 2+ right femoral, posterior tibial and dorsalis pedis pulses; 2+ left femoral, posterior tibial and dorsalis pedis pulses; no subclavian or femoral bruits Neurological: grossly nonfocal Psych: Normal mood and affect   ASSESSMENT:    1. Chronic systolic heart failure (HCC)   2. Coronary artery disease involving native coronary artery of native heart without angina pectoris   3. Essential hypertension   4. Diabetes mellitus type 2 in obese (HCC)   5. Hypercholesterolemia   6. ICD (implantable cardioverter-defibrillator) in place    PLAN:    In order of problems listed above:  1. CHF: euvolemic clinically and by optivol; does not require  loop diuretics.  It might still be a great idea to switch him to [facility] instead of losartan.  Will discuss this with Dr. Allyson Sabal. 2. CAD: s/p anterior MI 1998, s/p LAD-DES 09/05/2016.  Asymptomatic, no angina with activity. 3. HTN: Excellent control 4. DM: Reports good glycemic control 5. HLP: Parameters within desirable range on statin 6. ICD: Normal device function.  Remote downloads every 3 months and yearly office visits.   Medication Adjustments/Labs and Tests Ordered: Current medicines are reviewed at length with the patient today.  Concerns regarding medicines are outlined above.  Orders Placed This Encounter  Procedures  . EKG 12-Lead   No orders of the defined types were placed in this encounter.   Signed, Kevin Fair, MD  08/07/2017 7:56 PM    [facility]

## 2017-08-20 LAB — CUP PACEART INCLINIC DEVICE CHECK
Date Time Interrogation Session: 20190313120023
Lead Channel Setting Pacing Amplitude: 3.5 V
Lead Channel Setting Pacing Pulse Width: 0.4 ms
MDC IDC LEAD IMPLANT DT: 20180905
MDC IDC LEAD LOCATION: 753860
MDC IDC PG IMPLANT DT: 20180905
MDC IDC SET LEADCHNL RV SENSING SENSITIVITY: 0.3 mV

## 2017-09-10 DIAGNOSIS — D638 Anemia in other chronic diseases classified elsewhere: Secondary | ICD-10-CM | POA: Insufficient documentation

## 2017-09-10 DIAGNOSIS — R300 Dysuria: Secondary | ICD-10-CM | POA: Insufficient documentation

## 2017-10-01 IMAGING — CR DG CHEST 2V
2 series · 2 of 2 positions shown · non-contrast
Comparison: Radiographs September 04, 2016.

CLINICAL DATA: Status post defibrillator placement.

EXAM:
CHEST  2 VIEW

[chest pa]
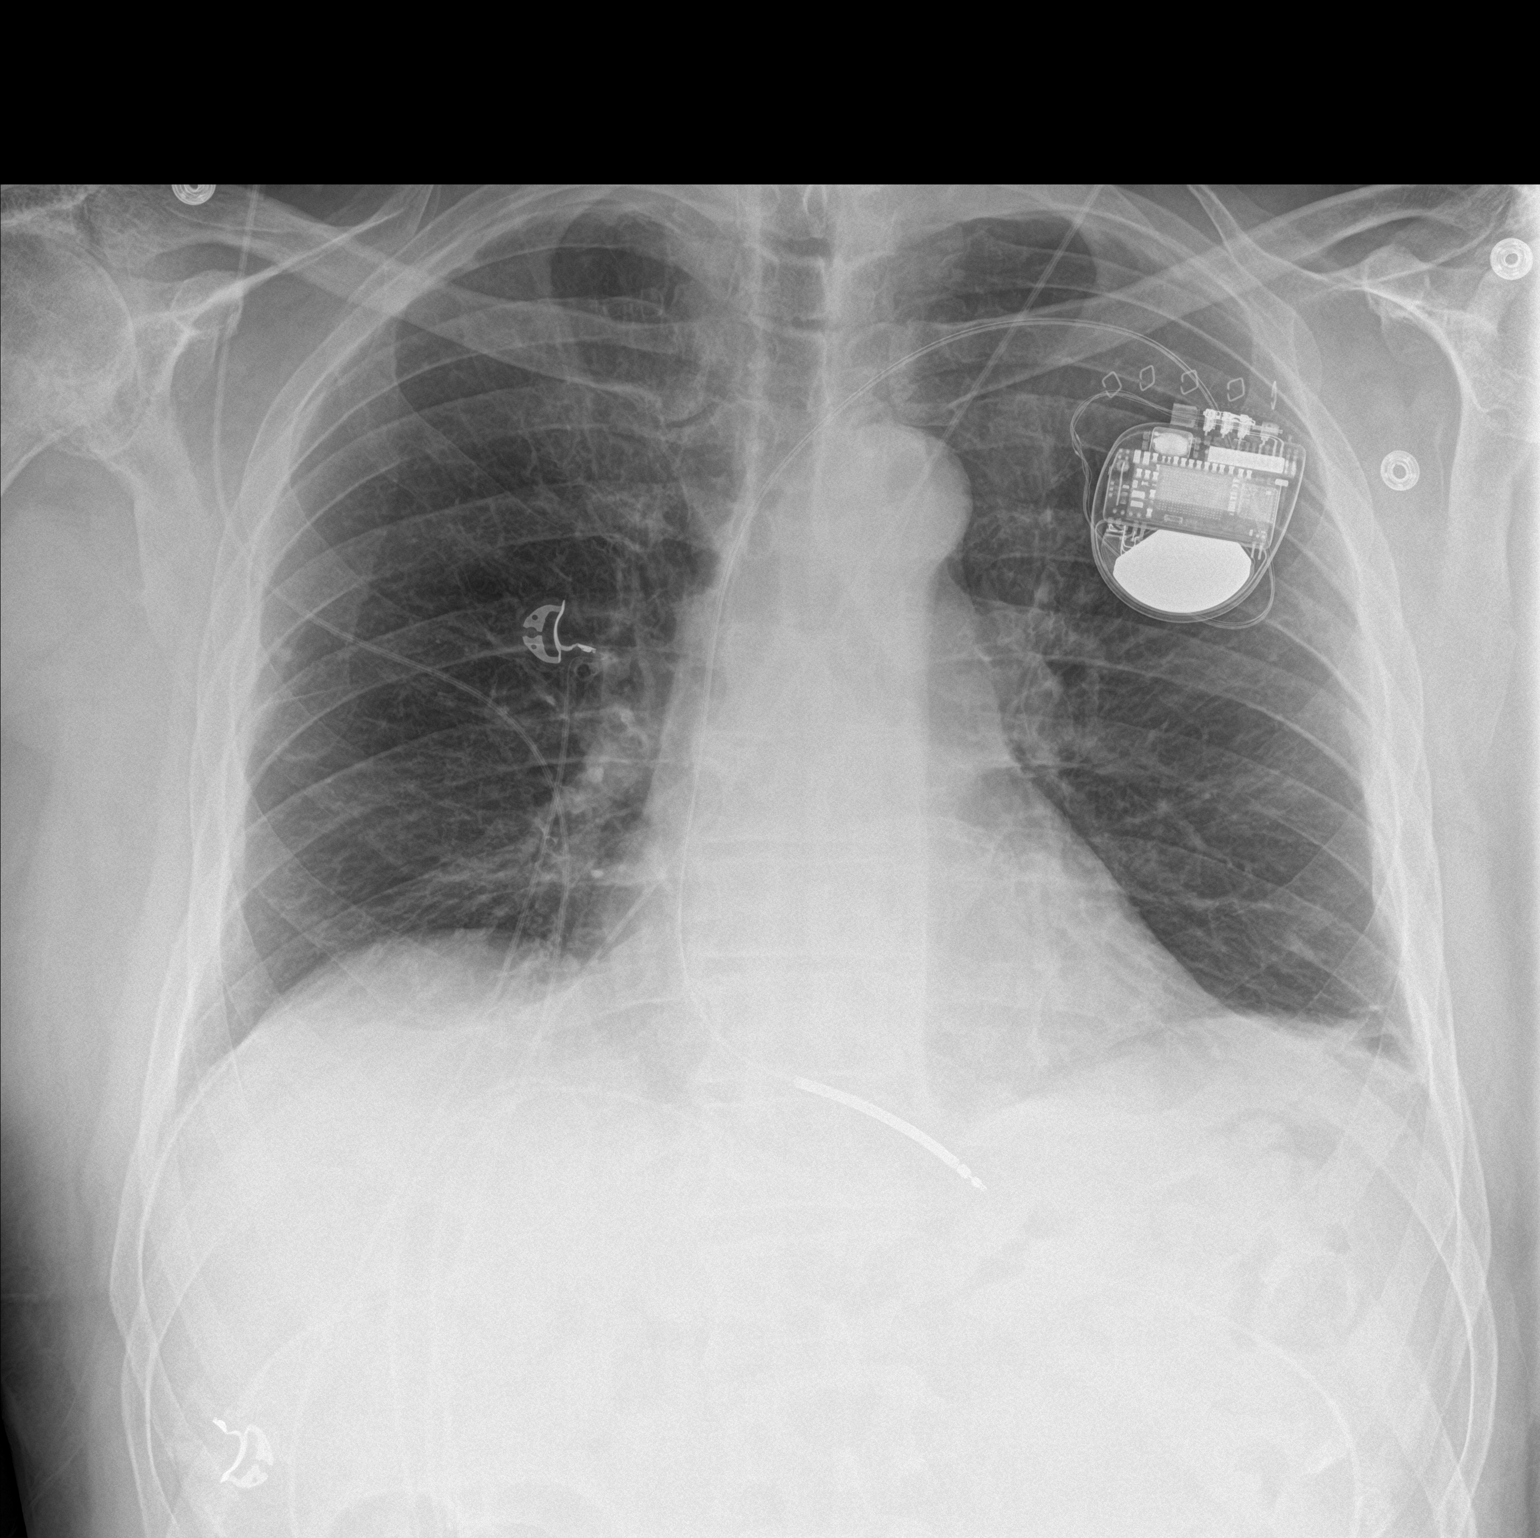

[chest lat]
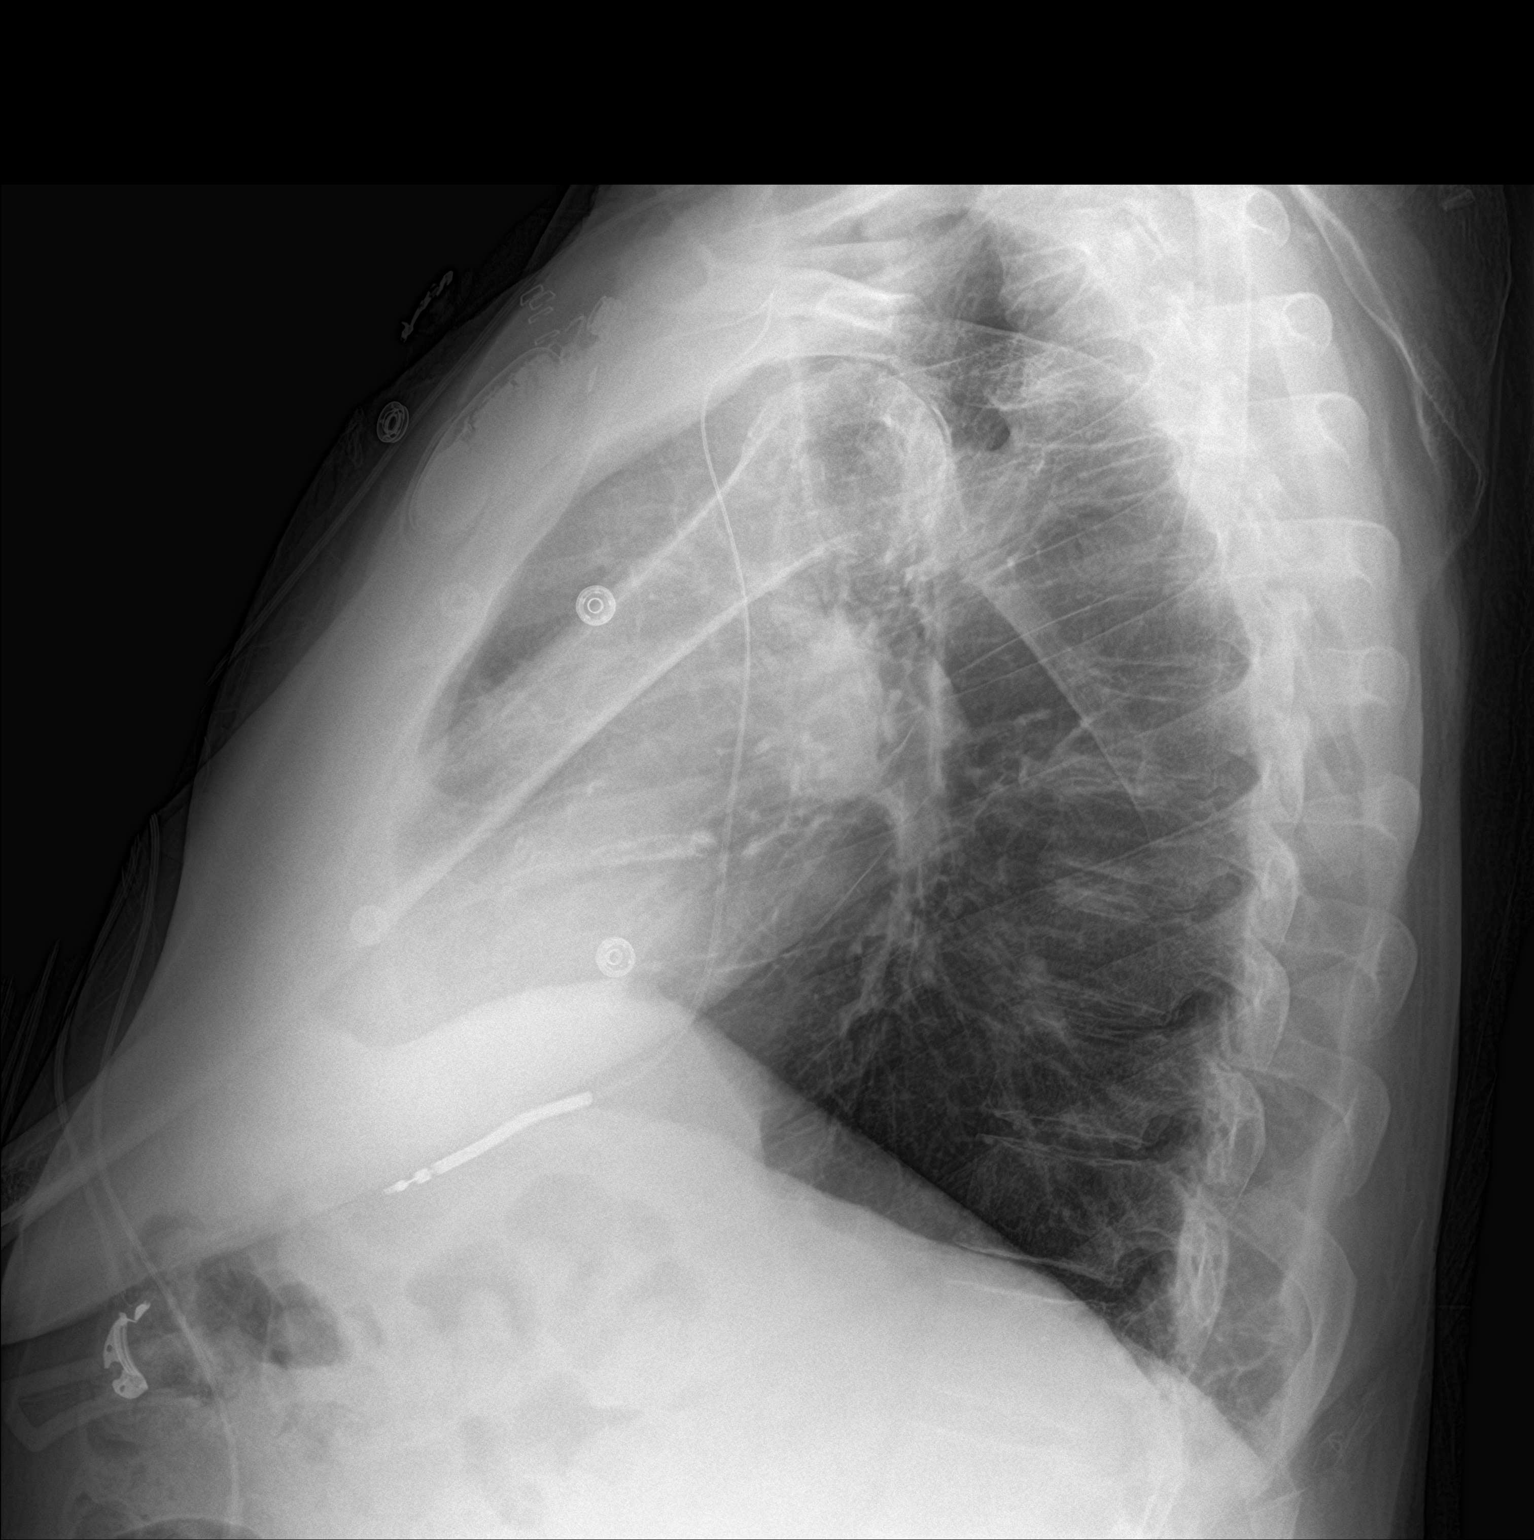

[2 of 2 positions shown; findings below may reference images not displayed]

FINDINGS: The heart size and mediastinal contours are within normal limits.
Interval placement of left-sided single lead defibrillator is noted
in grossly good position. No pneumothorax or pleural effusion is
noted. Mild bibasilar subsegmental atelectasis or scarring is noted.
Degenerative change of right shoulder is noted.
IMPRESSION: Mild bibasilar subsegmental atelectasis or scarring. Interval
placement of left-sided defibrillator in grossly good position. No
pneumothorax is noted.

## 2017-11-05 ENCOUNTER — Ambulatory Visit (INDEPENDENT_AMBULATORY_CARE_PROVIDER_SITE_OTHER): Payer: Medicare Other | Admitting: *Deleted

## 2017-11-05 ENCOUNTER — Telehealth: Payer: Self-pay

## 2017-11-05 DIAGNOSIS — I255 Ischemic cardiomyopathy: Secondary | ICD-10-CM | POA: Diagnosis not present

## 2017-11-05 DIAGNOSIS — Z9189 Other specified personal risk factors, not elsewhere classified: Secondary | ICD-10-CM

## 2017-11-05 NOTE — Telephone Encounter (Signed)
I spoke with the patient about this.

## 2017-11-07 LAB — CUP PACEART REMOTE DEVICE CHECK
Battery Remaining Longevity: 134 mo
Brady Statistic RV Percent Paced: 0.01 %
HIGH POWER IMPEDANCE MEASURED VALUE: 73 Ohm
Lead Channel Impedance Value: 418 Ohm
Lead Channel Impedance Value: 456 Ohm
Lead Channel Pacing Threshold Amplitude: 0.5 V
Lead Channel Sensing Intrinsic Amplitude: 11.5 mV
Lead Channel Setting Pacing Amplitude: 2.5 V
Lead Channel Setting Pacing Pulse Width: 0.4 ms
Lead Channel Setting Sensing Sensitivity: 0.3 mV
MDC IDC LEAD IMPLANT DT: 20180905
MDC IDC LEAD LOCATION: 753860
MDC IDC MSMT BATTERY VOLTAGE: 3.06 V
MDC IDC MSMT LEADCHNL RV PACING THRESHOLD PULSEWIDTH: 0.4 ms
MDC IDC MSMT LEADCHNL RV SENSING INTR AMPL: 11.5 mV
MDC IDC PG IMPLANT DT: 20180905
MDC IDC SESS DTM: 20190529192726

## 2017-11-07 NOTE — Progress Notes (Signed)
Remote ICD transmission.   

## 2017-11-24 NOTE — Telephone Encounter (Signed)
Called patient to send a manual transmisson

## 2018-01-09 ENCOUNTER — Other Ambulatory Visit: Payer: Self-pay | Admitting: Cardiovascular Disease

## 2018-01-20 ENCOUNTER — Other Ambulatory Visit: Payer: Self-pay | Admitting: Cardiovascular Disease

## 2018-01-20 NOTE — Telephone Encounter (Signed)
Rx request sent to pharmacy.  

## 2018-02-04 ENCOUNTER — Ambulatory Visit (INDEPENDENT_AMBULATORY_CARE_PROVIDER_SITE_OTHER): Payer: Medicare Other | Admitting: *Deleted

## 2018-02-04 ENCOUNTER — Telehealth: Payer: Self-pay

## 2018-02-04 DIAGNOSIS — I255 Ischemic cardiomyopathy: Secondary | ICD-10-CM

## 2018-02-04 NOTE — Telephone Encounter (Signed)
Spoke with pt and reminded pt of remote transmission that is due today. Pt verbalized understanding.   

## 2018-02-04 NOTE — Progress Notes (Signed)
Remote ICD transmission.   

## 2018-02-05 ENCOUNTER — Encounter: Payer: Self-pay | Admitting: Cardiology

## 2018-02-24 LAB — CUP PACEART REMOTE DEVICE CHECK
Battery Remaining Longevity: 133 mo
Date Time Interrogation Session: 20190828150643
HighPow Impedance: 63 Ohm
Implantable Lead Implant Date: 20180905
Implantable Lead Location: 753860
Lead Channel Impedance Value: 361 Ohm
Lead Channel Pacing Threshold Amplitude: 0.5 V
Lead Channel Sensing Intrinsic Amplitude: 11.375 mV
Lead Channel Setting Sensing Sensitivity: 0.3 mV
MDC IDC MSMT BATTERY VOLTAGE: 3.04 V
MDC IDC MSMT LEADCHNL RV IMPEDANCE VALUE: 418 Ohm
MDC IDC MSMT LEADCHNL RV PACING THRESHOLD PULSEWIDTH: 0.4 ms
MDC IDC MSMT LEADCHNL RV SENSING INTR AMPL: 11.375 mV
MDC IDC PG IMPLANT DT: 20180905
MDC IDC SET LEADCHNL RV PACING AMPLITUDE: 2.5 V
MDC IDC SET LEADCHNL RV PACING PULSEWIDTH: 0.4 ms
MDC IDC STAT BRADY RV PERCENT PACED: 0.01 %

## 2018-02-27 ENCOUNTER — Other Ambulatory Visit: Payer: Self-pay | Admitting: Cardiovascular Disease

## 2018-03-25 DIAGNOSIS — N390 Urinary tract infection, site not specified: Secondary | ICD-10-CM | POA: Insufficient documentation

## 2018-05-06 ENCOUNTER — Ambulatory Visit (INDEPENDENT_AMBULATORY_CARE_PROVIDER_SITE_OTHER): Payer: Medicare Other

## 2018-05-06 ENCOUNTER — Telehealth: Payer: Self-pay | Admitting: Cardiology

## 2018-05-06 DIAGNOSIS — I255 Ischemic cardiomyopathy: Secondary | ICD-10-CM | POA: Diagnosis not present

## 2018-05-06 NOTE — Progress Notes (Signed)
Remote ICD transmission.   

## 2018-05-06 NOTE — Telephone Encounter (Signed)
Spoke with pt and reminded pt of remote transmission that is due today. Pt verbalized understanding.   

## 2018-05-14 ENCOUNTER — Encounter: Payer: Self-pay | Admitting: Cardiology

## 2018-06-26 LAB — CUP PACEART REMOTE DEVICE CHECK
Brady Statistic RV Percent Paced: 0.01 %
Date Time Interrogation Session: 20191127190935
HighPow Impedance: 64 Ohm
Implantable Lead Implant Date: 20180905
Implantable Lead Location: 753860
Implantable Pulse Generator Implant Date: 20180905
Lead Channel Impedance Value: 361 Ohm
Lead Channel Impedance Value: 399 Ohm
Lead Channel Pacing Threshold Amplitude: 0.5 V
Lead Channel Sensing Intrinsic Amplitude: 11.375 mV
Lead Channel Setting Sensing Sensitivity: 0.3 mV
MDC IDC MSMT BATTERY REMAINING LONGEVITY: 132 mo
MDC IDC MSMT BATTERY VOLTAGE: 3.03 V
MDC IDC MSMT LEADCHNL RV PACING THRESHOLD PULSEWIDTH: 0.4 ms
MDC IDC MSMT LEADCHNL RV SENSING INTR AMPL: 11.375 mV
MDC IDC SET LEADCHNL RV PACING AMPLITUDE: 2.5 V
MDC IDC SET LEADCHNL RV PACING PULSEWIDTH: 0.4 ms

## 2018-07-19 ENCOUNTER — Other Ambulatory Visit: Payer: Self-pay | Admitting: Cardiovascular Disease

## 2018-07-22 ENCOUNTER — Ambulatory Visit (INDEPENDENT_AMBULATORY_CARE_PROVIDER_SITE_OTHER): Payer: Medicare Other | Admitting: Cardiovascular Disease

## 2018-07-22 ENCOUNTER — Encounter (INDEPENDENT_AMBULATORY_CARE_PROVIDER_SITE_OTHER): Payer: Self-pay

## 2018-07-22 ENCOUNTER — Encounter: Payer: Self-pay | Admitting: Cardiovascular Disease

## 2018-07-22 ENCOUNTER — Other Ambulatory Visit: Payer: Self-pay

## 2018-07-22 VITALS — BP 132/82 | HR 56 | Ht 76.0 in | Wt 262.0 lb

## 2018-07-22 DIAGNOSIS — I2510 Atherosclerotic heart disease of native coronary artery without angina pectoris: Secondary | ICD-10-CM | POA: Diagnosis not present

## 2018-07-22 DIAGNOSIS — Z5181 Encounter for therapeutic drug level monitoring: Secondary | ICD-10-CM

## 2018-07-22 DIAGNOSIS — E1169 Type 2 diabetes mellitus with other specified complication: Secondary | ICD-10-CM

## 2018-07-22 DIAGNOSIS — E7800 Pure hypercholesterolemia, unspecified: Secondary | ICD-10-CM

## 2018-07-22 DIAGNOSIS — I5042 Chronic combined systolic (congestive) and diastolic (congestive) heart failure: Secondary | ICD-10-CM

## 2018-07-22 DIAGNOSIS — I10 Essential (primary) hypertension: Secondary | ICD-10-CM

## 2018-07-22 DIAGNOSIS — E669 Obesity, unspecified: Secondary | ICD-10-CM

## 2018-07-22 DIAGNOSIS — Z95810 Presence of automatic (implantable) cardiac defibrillator: Secondary | ICD-10-CM

## 2018-07-22 NOTE — Progress Notes (Signed)
Cardiology Office Note:    Date:  07/22/2018   ID:  Kevin Patel, DOB April 13, 1946, MRN [id number]  PCP:  [name]., MD  Cardiologist:  [name], MD ; Nanetta Batty, MD   Referring MD: [name]., MD   Chief complaint: f/u ICD   History of Present Illness:    Kevin Patel is a 73 y.o. 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 Previously reported LV apical thrombus has not been seen on his recent echos since 2012 or his recent LV angiogram.  He returns for follow-up his defibrillator (single lead Medtronic VISIA AF device, MRI conditional.  The lead is a Medtronic [id number]).  He continues to bowl regularly.  He denies any problems with chest pain or dyspnea with activity and his knee is slowing down more than anything else. The patient specifically denies any chest pain at rest exertion, dyspnea at rest or with exertion, orthopnea, paroxysmal nocturnal dyspnea, syncope, palpitations, focal neurological deficits, intermittent claudication, lower extremity edema, unexplained weight gain, cough, hemoptysis or wheezing.  Device interrogation shows normal function.  The estimated generator longevity is 10-11 years.  He never requires ventricular pacing.  He has had 2 episodes of nonsustained ventricular tachycardia consisting of only 7-8 beats each, asymptomatic.  His device has recorded "3 atrial fibrillation" 3 times, but review of the electrograms shows sinus rhythm with PACs to be a much more likely explanation for the irregular rhythm.  OptiVol is very stable, without any episodes of hypovolemia recently.  Past Medical History:  Diagnosis Date  . AICD (automatic cardioverter/defibrillator) present 02/12/2017  . Chronic systolic HF (heart failure) (HCC)   . Coronary artery disease   . Diabetes mellitus with coincident  hypertension (HCC) 09/09/2016  . History of kidney stones   . HTN (hypertension)   . Hyperlipidemia   . Ischemic cardiomyopathy   . MI (myocardial infarction) (HCC) 1998   myoview 09/25/2006-no ischemia, EF 46%  . Mural thrombus of heart    on coumadin until follow up echo with contrast in Sept 13, 2012 which showed no thrombus and coumadin was d/c    Past Surgical History:  Procedure Laterality Date  . CORONARY ANGIOPLASTY  1998   PCI  . CORONARY STENT INTERVENTION N/A 09/05/2016   Procedure: Coronary Stent Intervention;  Surgeon: [name], MD;  Location: [facility] INVASIVE CV LAB;  Service: Cardiovascular;  Laterality: N/A;  . ICD IMPLANT N/A 02/12/2017   Procedure: ICD Implant;  Surgeon: [name], MD;  Location: [facility] INVASIVE CV LAB;  Service: Cardiovascular;  Laterality: N/A;  . INTRAVASCULAR ULTRASOUND/IVUS N/A 09/05/2016   Procedure: Intravascular Ultrasound/IVUS;  Surgeon: [name], MD;  Location: [facility] INVASIVE CV LAB;  Service: Cardiovascular;  Laterality: N/A;  . LEFT HEART CATH AND CORONARY ANGIOGRAPHY N/A 09/05/2016   Procedure: Left Heart Cath and Coronary Angiography;  Surgeon: [name], MD;  Location: [facility] INVASIVE CV LAB;  Service: Cardiovascular;  Laterality: N/A;    Current Medications: Current Meds  Medication Sig  . acetaminophen (TYLENOL) 325 MG tablet Take 2 tablets (650 mg total) by mouth every 4 (four) hours as needed for headache or mild pain.  Marland Kitchen aspirin 81 MG tablet Take 81 mg by mouth every morning.   Marland Kitchen atorvastatin (LIPITOR) 80 MG tablet TAKE 1 TABLET BY MOUTH ONCE DAILY  . carvedilol (  COREG) 12.5 MG tablet TAKE 1 TABLET BY MOUTH TWICE DAILY  . losartan-hydrochlorothiazide (HYZAAR) 100-12.5 MG tablet TAKE 1 TABLET BY MOUTH DAILY  . Multiple Vitamin (MULTIVITAMIN) capsule Take 1 capsule by mouth daily.  . nitroGLYCERIN (NITROSTAT) 0.4 MG SL tablet Place 1 tablet (0.4 mg total) under the tongue every 5 (five) minutes x 3 doses as  needed for chest pain.  Marland Kitchen. omega-3 acid ethyl esters (LOVAZA) 1 g capsule Take 1 g by mouth daily.  . vitamin C (ASCORBIC ACID) 500 MG tablet Take 500 mg by mouth daily.  . vitamin E 400 UNIT capsule Take 400 Units by mouth daily.  . [DISCONTINUED] GLUCOSAMINE HCL PO Take 1 tablet by mouth daily.  . [DISCONTINUED] ticagrelor (BRILINTA) 90 MG TABS tablet Take 1 tablet (90 mg total) by mouth 2 (two) times daily.     Allergies:   Patient has no known allergies.   Social History   Socioeconomic History  . Marital status: Married    Spouse name: Not on file  . Number of children: Not on file  . Years of education: Not on file  . Highest education level: Not on file  Occupational History  . Not on file  Social Needs  . Financial resource strain: Not on file  . Food insecurity:    Worry: Not on file    Inability: Not on file  . Transportation needs:    Medical: Not on file    Non-medical: Not on file  Tobacco Use  . Smoking status: Former Smoker    Last attempt to quit: 02/12/1993    Years since quitting: 25.4  . Smokeless tobacco: Never Used  Substance and Sexual Activity  . Alcohol use: No  . Drug use: No  . Sexual activity: Not on file  Lifestyle  . Physical activity:    Days per week: Not on file    Minutes per session: Not on file  . Stress: Not on file  Relationships  . Social connections:    Talks on phone: Not on file    Gets together: Not on file    Attends religious service: Not on file    Active member of club or organization: Not on file    Attends meetings of clubs or organizations: Not on file    Relationship status: Not on file  Other Topics Concern  . Not on file  Social History Narrative  . Not on file     Family History: The patient's family history includes Emphysema in his father. ROS:   Please see the history of present illness.     All other systems reviewed and are negative.  EKGs/Labs/Other Studies Reviewed:    The following studies were  reviewed today: Last echocardiogram and coronary angiograms, last note from Dr. Allyson SabalBerry  EKG:  EKG is ordered today.  It shows sinus bradycardia with left axis deviation and QS pattern consistent with old anteroseptal infarction in V1 V2, negative T waves in the lateral leads unchanged from previous tracing, QTC 430 ms   Recent Labs: No results found for requested labs within last 8760 hours.  Recent Lipid Panel    Component Value Date/Time   CHOL 103 09/16/2016 0949   CHOL 121 03/10/2015 0857   TRIG 110 09/16/2016 0949   HDL 37 (L) 09/16/2016 0949   HDL 51 03/10/2015 0857   CHOLHDL 2.8 09/16/2016 0949   VLDL 22 09/16/2016 0949   LDLCALC 44 09/16/2016 0949   LDLCALC 56 03/10/2015 0857  Physical Exam:    VS:  BP 132/82   Pulse (!) 56   Ht 6\' 4"  (1.93 m)   Wt 262 lb (118.8 kg)   SpO2 98%   BMI 31.89 kg/m     Wt Readings from Last 3 Encounters:  07/22/18 262 lb (118.8 kg)  08/06/17 250 lb (113.4 kg)  02/12/17 243 lb (110.2 kg)      General: Alert, oriented x3, no distress, mildly obese, but also appears fairly strong and fit for his age. Head: no evidence of trauma, PERRL, EOMI, no exophtalmos or lid lag, no myxedema, no xanthelasma; normal ears, nose and oropharynx Neck: normal jugular venous pulsations and no hepatojugular reflux; brisk carotid pulses without delay and no carotid bruits Chest: clear to auscultation, no signs of consolidation by percussion or palpation, normal fremitus, symmetrical and full respiratory excursions Cardiovascular: normal position and quality of the apical impulse, regular rhythm, normal first and second heart sounds, no murmurs, rubs or gallops Abdomen: no tenderness or distention, no masses by palpation, no abnormal pulsatility or arterial bruits, normal bowel sounds, no hepatosplenomegaly Extremities: no clubbing, cyanosis or edema; 2+ radial, ulnar and brachial pulses bilaterally; 2+ right femoral, posterior tibial and dorsalis pedis  pulses; 2+ left femoral, posterior tibial and dorsalis pedis pulses; no subclavian or femoral bruits Neurological: grossly nonfocal Psych: Normal mood and affect   ASSESSMENT:    1. Chronic combined systolic (congestive) and diastolic (congestive) heart failure (HCC)   2. Coronary artery disease involving native coronary artery of native heart without angina pectoris   3. Essential hypertension   4. Diabetes mellitus type 2 in obese (HCC)   5. Hypercholesterolemia   6. ICD (implantable cardioverter-defibrillator) in place   7. Therapeutic drug monitoring    PLAN:    In order of problems listed above:  1. CHF: NYHA functional class I, clinically euvolemic and with stable OptiVol.  We have considered switching to [facility] but his functional status is so good he would rather not change medications. 2. CAD: s/p anterior MI 1998, s/p LAD-DES 09/05/2016.  He is asymptomatic despite being physically active 3. HTN: Fair control.  On ARB and carvedilol as well as a low-dose of thiazide diuretic. 4. DM: Reports good glycemic control, but it is time to repeat a hemoglobin A1c 5. HLP: Likewise at the time to recheck his lipid profile 6. ICD: Normal device function.  Continue remote downloads every 3 months and the yearly office visit.   Medication Adjustments/Labs and Tests Ordered: Current medicines are reviewed at length with the patient today.  Concerns regarding medicines are outlined above.  Orders Placed This Encounter  Procedures  . Hemoglobin A1c  . Lipid panel  . Comprehensive metabolic panel   No orders of the defined types were placed in this encounter.   Signed, [name], MD  07/22/2018 4:17 PM    [facility]

## 2018-07-22 NOTE — Patient Instructions (Signed)
Medication Instructions:  Your physician recommends that you continue on your current medications as directed. Please refer to the Current Medication list given to you today.  If you need a refill on your cardiac medications before your next appointment, please call your pharmacy.   Lab work: LP/CMET/A1C TODAY   If you have labs (blood work) drawn today and your tests are completely normal, you will receive your results only by: Marland Kitchen MyChart Message (if you have MyChart) OR . A paper copy in the mail If you have any lab test that is abnormal or we need to change your treatment, we will call you to review the results.  Testing/Procedures: NONE  Follow-Up: At [facility], you and your health needs are our priority.  As part of our continuing mission to provide you with exceptional heart care, we have created designated Provider Care Teams.  These Care Teams include your primary Cardiologist (physician) and Advanced Practice Providers (APPs -  Physician Assistants and Nurse Practitioners) who all work together to provide you with the care you need, when you need it. You will need a follow up appointment in 12 months.  Please call our office 2 months in advance to schedule this appointment.  You may see DR Royann Shivers  or one of the following Advanced Practice Providers on your designated Care Team: Azalee Course, [place] . Micah Flesher, PA-C

## 2018-07-23 LAB — COMPREHENSIVE METABOLIC PANEL
ALK PHOS: 68 IU/L (ref 39–117)
ALT: 17 IU/L (ref 0–44)
AST: 22 IU/L (ref 0–40)
Albumin/Globulin Ratio: 2 (ref 1.2–2.2)
Albumin: 4.5 g/dL (ref 3.7–4.7)
BUN/Creatinine Ratio: 12 (ref 10–24)
BUN: 16 mg/dL (ref 8–27)
Bilirubin Total: 0.7 mg/dL (ref 0.0–1.2)
CO2: 24 mmol/L (ref 20–29)
CREATININE: 1.37 mg/dL — AB (ref 0.76–1.27)
Calcium: 9.7 mg/dL (ref 8.6–10.2)
Chloride: 102 mmol/L (ref 96–106)
GFR calc Af Amer: 59 mL/min/{1.73_m2} — ABNORMAL LOW (ref >59)
GFR calc non Af Amer: 51 mL/min/{1.73_m2} — ABNORMAL LOW (ref >59)
GLUCOSE: 130 mg/dL — AB (ref 65–99)
Globulin, Total: 2.3 g/dL (ref 1.5–4.5)
Potassium: 3.9 mmol/L (ref 3.5–5.2)
Sodium: 140 mmol/L (ref 134–144)
Total Protein: 6.8 g/dL (ref 6.0–8.5)

## 2018-07-23 LAB — HEMOGLOBIN A1C
ESTIMATED AVERAGE GLUCOSE: 143 mg/dL
Hgb A1c MFr Bld: 6.6 % — ABNORMAL HIGH (ref 4.8–5.6)

## 2018-07-23 LAB — LIPID PANEL
CHOL/HDL RATIO: 3 ratio (ref 0.0–5.0)
Cholesterol, Total: 119 mg/dL (ref 100–199)
HDL: 40 mg/dL (ref >39)
LDL CALC: 55 mg/dL (ref 0–99)
Triglycerides: 121 mg/dL (ref 0–149)
VLDL CHOLESTEROL CAL: 24 mg/dL (ref 5–40)

## 2018-07-28 LAB — CUP PACEART INCLINIC DEVICE CHECK
Implantable Lead Implant Date: 20180905
Implantable Pulse Generator Implant Date: 20180905
MDC IDC LEAD LOCATION: 753860
MDC IDC SESS DTM: 20200218153109

## 2018-08-05 ENCOUNTER — Ambulatory Visit (INDEPENDENT_AMBULATORY_CARE_PROVIDER_SITE_OTHER): Payer: Medicare Other | Admitting: *Deleted

## 2018-08-05 DIAGNOSIS — I255 Ischemic cardiomyopathy: Secondary | ICD-10-CM | POA: Diagnosis not present

## 2018-08-06 LAB — CUP PACEART REMOTE DEVICE CHECK
Battery Remaining Longevity: 130 mo
Battery Voltage: 3.01 V
HIGH POWER IMPEDANCE MEASURED VALUE: 71 Ohm
Implantable Pulse Generator Implant Date: 20180905
Lead Channel Pacing Threshold Pulse Width: 0.4 ms
Lead Channel Sensing Intrinsic Amplitude: 12 mV
Lead Channel Setting Pacing Amplitude: 2.5 V
Lead Channel Setting Pacing Pulse Width: 0.4 ms
Lead Channel Setting Sensing Sensitivity: 0.3 mV
MDC IDC LEAD IMPLANT DT: 20180905
MDC IDC LEAD LOCATION: 753860
MDC IDC MSMT LEADCHNL RV IMPEDANCE VALUE: 361 Ohm
MDC IDC MSMT LEADCHNL RV IMPEDANCE VALUE: 399 Ohm
MDC IDC MSMT LEADCHNL RV PACING THRESHOLD AMPLITUDE: 0.5 V
MDC IDC MSMT LEADCHNL RV SENSING INTR AMPL: 12 mV
MDC IDC SESS DTM: 20200226081803
MDC IDC STAT BRADY RV PERCENT PACED: 0.01 %

## 2018-08-12 ENCOUNTER — Encounter: Payer: Self-pay | Admitting: Cardiology

## 2018-08-12 NOTE — Progress Notes (Signed)
Remote ICD transmission.   

## 2018-10-12 ENCOUNTER — Other Ambulatory Visit: Payer: Self-pay | Admitting: Cardiovascular Disease

## 2018-11-04 ENCOUNTER — Ambulatory Visit (INDEPENDENT_AMBULATORY_CARE_PROVIDER_SITE_OTHER): Payer: Medicare Other | Admitting: *Deleted

## 2018-11-04 DIAGNOSIS — I255 Ischemic cardiomyopathy: Secondary | ICD-10-CM | POA: Diagnosis not present

## 2018-11-05 ENCOUNTER — Telehealth: Payer: Self-pay

## 2018-11-05 NOTE — Telephone Encounter (Signed)
Left message for patient to remind of missed remote transmission.  

## 2018-11-06 LAB — CUP PACEART REMOTE DEVICE CHECK
Battery Remaining Longevity: 128 mo
Battery Voltage: 3.02 V
Brady Statistic RV Percent Paced: 0.01 %
Date Time Interrogation Session: 20200529135425
HighPow Impedance: 64 Ohm
Implantable Lead Implant Date: 20180905
Implantable Lead Location: 753860
Implantable Pulse Generator Implant Date: 20180905
Lead Channel Impedance Value: 342 Ohm
Lead Channel Impedance Value: 399 Ohm
Lead Channel Pacing Threshold Amplitude: 0.625 V
Lead Channel Pacing Threshold Pulse Width: 0.4 ms
Lead Channel Sensing Intrinsic Amplitude: 11.875 mV
Lead Channel Sensing Intrinsic Amplitude: 11.875 mV
Lead Channel Setting Pacing Amplitude: 2.5 V
Lead Channel Setting Pacing Pulse Width: 0.4 ms
Lead Channel Setting Sensing Sensitivity: 0.3 mV

## 2018-11-13 ENCOUNTER — Encounter: Payer: Self-pay | Admitting: Cardiology

## 2018-11-13 NOTE — Progress Notes (Signed)
Remote ICD transmission.   

## 2018-12-28 ENCOUNTER — Other Ambulatory Visit: Payer: Self-pay | Admitting: Cardiovascular Disease

## 2019-02-03 ENCOUNTER — Ambulatory Visit (INDEPENDENT_AMBULATORY_CARE_PROVIDER_SITE_OTHER): Payer: Medicare Other | Admitting: *Deleted

## 2019-02-03 DIAGNOSIS — I255 Ischemic cardiomyopathy: Secondary | ICD-10-CM

## 2019-02-05 LAB — CUP PACEART REMOTE DEVICE CHECK
Battery Remaining Longevity: 126 mo
Battery Voltage: 3.02 V
Brady Statistic RV Percent Paced: 0.01 %
Date Time Interrogation Session: 20200828062604
HighPow Impedance: 77 Ohm
Implantable Lead Implant Date: 20180905
Implantable Lead Location: 753860
Implantable Pulse Generator Implant Date: 20180905
Lead Channel Impedance Value: 361 Ohm
Lead Channel Impedance Value: 418 Ohm
Lead Channel Pacing Threshold Amplitude: 0.5 V
Lead Channel Pacing Threshold Pulse Width: 0.4 ms
Lead Channel Sensing Intrinsic Amplitude: 12.625 mV
Lead Channel Sensing Intrinsic Amplitude: 12.625 mV
Lead Channel Setting Pacing Amplitude: 2.5 V
Lead Channel Setting Pacing Pulse Width: 0.4 ms
Lead Channel Setting Sensing Sensitivity: 0.3 mV

## 2019-02-12 ENCOUNTER — Encounter: Payer: Self-pay | Admitting: Cardiology

## 2019-02-12 NOTE — Progress Notes (Signed)
Remote ICD transmission.   

## 2019-02-13 ENCOUNTER — Other Ambulatory Visit: Payer: Self-pay | Admitting: Cardiovascular Disease

## 2019-05-05 ENCOUNTER — Ambulatory Visit (INDEPENDENT_AMBULATORY_CARE_PROVIDER_SITE_OTHER): Payer: Medicare Other | Admitting: *Deleted

## 2019-05-05 DIAGNOSIS — I5022 Chronic systolic (congestive) heart failure: Secondary | ICD-10-CM | POA: Diagnosis not present

## 2019-05-05 LAB — CUP PACEART REMOTE DEVICE CHECK
Battery Remaining Longevity: 124 mo
Battery Voltage: 3.01 V
Brady Statistic RV Percent Paced: 0.01 %
Date Time Interrogation Session: 20201125105257
HighPow Impedance: 64 Ohm
Implantable Lead Implant Date: 20180905
Implantable Lead Location: 753860
Implantable Pulse Generator Implant Date: 20180905
Lead Channel Impedance Value: 342 Ohm
Lead Channel Impedance Value: 399 Ohm
Lead Channel Pacing Threshold Amplitude: 0.5 V
Lead Channel Pacing Threshold Pulse Width: 0.4 ms
Lead Channel Sensing Intrinsic Amplitude: 12.875 mV
Lead Channel Sensing Intrinsic Amplitude: 12.875 mV
Lead Channel Setting Pacing Amplitude: 2.5 V
Lead Channel Setting Pacing Pulse Width: 0.4 ms
Lead Channel Setting Sensing Sensitivity: 0.3 mV

## 2019-06-01 NOTE — Progress Notes (Signed)
ICD remote 

## 2019-06-28 ENCOUNTER — Other Ambulatory Visit: Payer: Self-pay | Admitting: Cardiovascular Disease

## 2019-06-29 NOTE — Telephone Encounter (Signed)
Rx(s) sent to pharmacy electronically.  

## 2019-08-04 ENCOUNTER — Ambulatory Visit (INDEPENDENT_AMBULATORY_CARE_PROVIDER_SITE_OTHER): Payer: Medicare Other | Admitting: *Deleted

## 2019-08-04 DIAGNOSIS — I5022 Chronic systolic (congestive) heart failure: Secondary | ICD-10-CM | POA: Diagnosis not present

## 2019-08-04 LAB — CUP PACEART REMOTE DEVICE CHECK
Battery Remaining Longevity: 122 mo
Battery Voltage: 3.01 V
Brady Statistic RV Percent Paced: 0.01 %
Date Time Interrogation Session: 20210224022823
HighPow Impedance: 78 Ohm
Implantable Lead Implant Date: 20180905
Implantable Lead Location: 753860
Implantable Pulse Generator Implant Date: 20180905
Lead Channel Impedance Value: 418 Ohm
Lead Channel Impedance Value: 456 Ohm
Lead Channel Pacing Threshold Amplitude: 0.5 V
Lead Channel Pacing Threshold Pulse Width: 0.4 ms
Lead Channel Sensing Intrinsic Amplitude: 13.875 mV
Lead Channel Sensing Intrinsic Amplitude: 13.875 mV
Lead Channel Setting Pacing Amplitude: 2.5 V
Lead Channel Setting Pacing Pulse Width: 0.4 ms
Lead Channel Setting Sensing Sensitivity: 0.3 mV

## 2019-08-05 NOTE — Progress Notes (Signed)
ICD Remote  

## 2019-08-09 ENCOUNTER — Telehealth: Payer: Self-pay

## 2019-08-09 ENCOUNTER — Other Ambulatory Visit: Payer: Self-pay

## 2019-08-09 ENCOUNTER — Ambulatory Visit (INDEPENDENT_AMBULATORY_CARE_PROVIDER_SITE_OTHER): Payer: Medicare Other | Admitting: Cardiovascular Disease

## 2019-08-09 ENCOUNTER — Encounter: Payer: Self-pay | Admitting: Cardiovascular Disease

## 2019-08-09 VITALS — BP 131/81 | HR 57 | Temp 97.2°F | Ht 76.0 in | Wt 277.2 lb

## 2019-08-09 DIAGNOSIS — E669 Obesity, unspecified: Secondary | ICD-10-CM

## 2019-08-09 DIAGNOSIS — I2510 Atherosclerotic heart disease of native coronary artery without angina pectoris: Secondary | ICD-10-CM | POA: Diagnosis not present

## 2019-08-09 DIAGNOSIS — I5022 Chronic systolic (congestive) heart failure: Secondary | ICD-10-CM | POA: Diagnosis not present

## 2019-08-09 DIAGNOSIS — Z95810 Presence of automatic (implantable) cardiac defibrillator: Secondary | ICD-10-CM

## 2019-08-09 DIAGNOSIS — I10 Essential (primary) hypertension: Secondary | ICD-10-CM | POA: Diagnosis not present

## 2019-08-09 DIAGNOSIS — E1169 Type 2 diabetes mellitus with other specified complication: Secondary | ICD-10-CM | POA: Diagnosis not present

## 2019-08-09 DIAGNOSIS — E7800 Pure hypercholesterolemia, unspecified: Secondary | ICD-10-CM

## 2019-08-09 NOTE — Progress Notes (Signed)
Cardiology Office Note:    Date:  08/09/2019   ID:  Kevin Patel, DOB 1946-04-14, MRN [id number]  PCP:  Raina Mina., MD  Cardiologist:  Sanda Klein, MD   Referring MD: Raina Mina., MD   Chief Complaint  Patient presents with  . Coronary Artery Disease  : f/u ICD   History of Present Illness:    Kevin Patel is a 74 y.o. 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 Previously reported LV apical thrombus has not been seen on his recent echos since 2012 or his recent LV angiogram. He has a single lead Medtronic VISIA AF device, MRI conditional.  The lead is a Medtronic [id number].  He remains physically active and bowls competitively twice a week.  His knee is slowly him down more than any cardiovascular complaints. The patient specifically denies any chest pain at rest exertion, dyspnea at rest or with exertion, orthopnea, paroxysmal nocturnal dyspnea, syncope, palpitations, focal neurological deficits, intermittent claudication, lower extremity edema, unexplained weight gain, cough, hemoptysis or wheezing.  Device interrogation shows normal function.  The estimated generator longevity is 10.1 years.  He has not had any episodes of ventricular tachycardia and has never received treatment from his device.  No recent episodes of atrial fibrillation.  OptiVol is currently in normal range, with a slight increase above threshold around Christmas holidays.  Past Medical History:  Diagnosis Date  . AICD (automatic cardioverter/defibrillator) present 02/12/2017  . Chronic systolic HF (heart failure) ([place])   . Coronary artery disease   . Diabetes mellitus with coincident hypertension ([place]) 09/09/2016  . History of kidney stones   . HTN (hypertension)   . Hyperlipidemia   . Ischemic cardiomyopathy   . MI (myocardial infarction) ([place]) 1998    myoview 09/25/2006-no ischemia, EF 46%  . Mural thrombus of heart    on coumadin until follow up echo with contrast in Sept 13, 2012 which showed no thrombus and coumadin was d/c    Past Surgical History:  Procedure Laterality Date  . [place]   PCI  . CORONARY STENT INTERVENTION N/A 09/05/2016   Procedure: Coronary Stent Intervention;  Surgeon: Burnell Blanks, MD;  Location: [place] [facility];  Service: Cardiovascular;  Laterality: N/A;  . ICD IMPLANT N/A 02/12/2017   Procedure: ICD Implant;  Surgeon: Sanda Klein, MD;  Location: [place] [facility];  Service: Cardiovascular;  Laterality: N/A;  . INTRAVASCULAR ULTRASOUND/IVUS N/A 09/05/2016   Procedure: Intravascular Ultrasound/IVUS;  Surgeon: Burnell Blanks, MD;  Location: [place] [facility];  Service: Cardiovascular;  Laterality: N/A;  . LEFT HEART CATH AND CORONARY ANGIOGRAPHY N/A 09/05/2016   Procedure: Left Heart Cath and Coronary Angiography;  Surgeon: Burnell Blanks, MD;  Location: [facility] [facility];  Service: Cardiovascular;  Laterality: N/A;    Current Medications: Current Meds  Medication Sig  . nitroGLYCERIN (NITROSTAT) 0.4 MG SL tablet Place under the tongue.     Allergies:   Patient has no known allergies.   Social History   Socioeconomic History  . Marital status: Married    Spouse name: Not on file  . Number of children: Not on file  . Years of education: Not on file  . Highest education level: Not on file  Occupational History  . Not on file  Tobacco Use  . Smoking  status: Former Smoker    Quit date: 02/12/1993    Years since quitting: 26.5  . Smokeless tobacco: Never Used  Substance and Sexual Activity  . Alcohol use: No  . Drug use: No  . Sexual activity: Not on file  Other Topics Concern  . Not on file  Social History Narrative  . Not on file   Social Determinants of Health   Financial Resource Strain:   . Difficulty of Paying Living Expenses: Not on file   Food Insecurity:   . Worried About Programme researcher, broadcasting/film/video in the Last Year: Not on file  . Ran Out of Food in the Last Year: Not on file  Transportation Needs:   . Lack of Transportation (Medical): Not on file  . Lack of Transportation (Non-Medical): Not on file  Physical Activity:   . Days of Exercise per Week: Not on file  . Minutes of Exercise per Session: Not on file  Stress:   . Feeling of Stress : Not on file  Social Connections:   . Frequency of Communication with Friends and Family: Not on file  . Frequency of Social Gatherings with Friends and Family: Not on file  . Attends Religious Services: Not on file  . Active Member of Clubs or Organizations: Not on file  . Attends Banker Meetings: Not on file  . Marital Status: Not on file     Family History: The patient's family history includes Emphysema in his father. ROS:   Please see the history of present illness.    All other systems are reviewed and are negative.   EKGs/Labs/Other Studies Reviewed:    The following studies were reviewed today: Last echocardiogram and coronary angiograms  EKG:  EKG is ordered today and shows sinus brady 57 bpm, inc LBBB (QRS 116 ms, broader since 2019), ST depression and T wave inversion I, aVL, II, III, aVF, V3-V6. QTc 432 ms.     Recent Labs: No results found for requested labs within last 8760 hours.  06/03/2019 Hemoglobin A1c 6.3% Total cholesterol 125, triglycerides 123, HDL 46, LDL 65 Glucose 139, creatinine 1.15, normal liver function test, potassium 3.7, hemoglobin 14.1 Recent Lipid Panel    Component Value Date/Time   CHOL 119 07/22/2018 1104   TRIG 121 07/22/2018 1104   HDL 40 07/22/2018 1104   CHOLHDL 3.0 07/22/2018 1104   CHOLHDL 2.8 09/16/2016 0949   VLDL 22 09/16/2016 0949   LDLCALC 55 07/22/2018 1104     Physical Exam:    VS:  BP 131/81   Pulse (!) 57   Temp (!) 97.2 F (36.2 C)   Ht 6\' 4"  (1.93 m)   Wt 277 lb 3.2 oz (125.7 kg)   SpO2 97%    BMI 33.74 kg/m     Wt Readings from Last 3 Encounters:  08/09/19 277 lb 3.2 oz (125.7 kg)  07/22/18 262 lb (118.8 kg)  08/06/17 250 lb (113.4 kg)      General: Alert, oriented x3, no distress, mildly obese, but also appears fairly strong and fit for his age. Head: no evidence of trauma, PERRL, EOMI, no exophtalmos or lid lag, no myxedema, no xanthelasma; normal ears, nose and oropharynx Neck: normal jugular venous pulsations and no hepatojugular reflux; brisk carotid pulses without delay and no carotid bruits Chest: clear to auscultation, no signs of consolidation by percussion or palpation, normal fremitus, symmetrical and full respiratory excursions Cardiovascular: normal position and quality of the apical impulse, regular rhythm, normal first  and second heart sounds, no murmurs, rubs or gallops Abdomen: no tenderness or distention, no masses by palpation, no abnormal pulsatility or arterial bruits, normal bowel sounds, no hepatosplenomegaly Extremities: no clubbing, cyanosis or edema; 2+ radial, ulnar and brachial pulses bilaterally; 2+ right femoral, posterior tibial and dorsalis pedis pulses; 2+ left femoral, posterior tibial and dorsalis pedis pulses; no subclavian or femoral bruits Neurological: grossly nonfocal Psych: Normal mood and affect   ASSESSMENT:    1. Chronic systolic heart failure (HCC)   2. Coronary artery disease involving native coronary artery of native heart without angina pectoris   3. Essential hypertension   4. Diabetes mellitus type 2 in obese (HCC)   5. Hypercholesterolemia   6. ICD (implantable cardioverter-defibrillator) in place    PLAN:    In order of problems listed above:  1. CHF: NYHA functional class I and euvolemic without loop diuretics.  Offered Entresto last year but he did not want to change his medications.  Continue carvedilol and ARB. 2. CAD: s/p anterior MI 1998, s/p LAD-DES 09/05/2016.  Asymptomatic.  On aspirin and  beta-blocker. 3. HTN: adequate control. 4. DM: Good glycemic control.  He has gained some weight.  Promises to lose it. 5. HLP: All lipid parameters in target range. 6. ICD: Remote downloads every 3 months and yearly office visit.  Normal device function.  Medication Adjustments/Labs and Tests Ordered: Current medicines are reviewed at length with the patient today.  Concerns regarding medicines are outlined above.  Orders Placed This Encounter  Procedures  . EKG 12-Lead   No orders of the defined types were placed in this encounter.   Signed, [name], MD  08/09/2019 11:13 AM    [facility]

## 2019-08-09 NOTE — Telephone Encounter (Signed)
LMOVM to stop sending manual transmissions. I also left my direct office number if the pt has any questions.

## 2019-08-09 NOTE — Patient Instructions (Signed)
Medication Instructions:  No changes If you need a refill on your cardiac medications before your next appointment, please call your pharmacy.   Lab work: None ordered If you have labs (blood work) drawn today and your tests are completely normal, you will receive your results only by: MyChart Message (if you have MyChart) OR A paper copy in the mail If you have any lab test that is abnormal or we need to change your treatment, we will call you to review the results.  Testing/Procedures: None ordered  Follow-Up: At [facility], you and your health needs are our priority.  As part of our continuing mission to provide you with exceptional heart care, we have created designated Provider Care Teams.  These Care Teams include your primary Cardiologist (physician) and Advanced Practice Providers (APPs -  Physician Assistants and Nurse Practitioners) who all work together to provide you with the care you need, when you need it.  We recommend signing up for the patient portal called "MyChart".  Sign up information is provided on this After Visit Summary.  MyChart is used to connect with patients for Virtual Visits (Telemedicine).  Patients are able to view lab/test results, encounter notes, upcoming appointments, etc.  Non-urgent messages can be sent to your provider as well.   To learn more about what you can do with MyChart, go to [email or web address].    Your next appointment:   12 month(s)  The format for your next appointment:   In Person  Provider:   [name], MD     Remote monitoring is used to monitor your defibrillator from home. This monitoring reduces the number of office visits required to check your device to one time per year. It allows [place] to keep an eye on the functioning of your device to ensure it is working properly. You are scheduled for a device check from home on 11/03/19. You may send your transmission at any time that day. If you have a wireless device, the  transmission will be sent automatically.

## 2019-08-11 ENCOUNTER — Other Ambulatory Visit: Payer: Self-pay | Admitting: Cardiovascular Disease

## 2019-09-29 ENCOUNTER — Other Ambulatory Visit: Payer: Self-pay | Admitting: Cardiovascular Disease

## 2019-11-03 ENCOUNTER — Ambulatory Visit (INDEPENDENT_AMBULATORY_CARE_PROVIDER_SITE_OTHER): Payer: Medicare Other | Admitting: *Deleted

## 2019-11-03 DIAGNOSIS — I255 Ischemic cardiomyopathy: Secondary | ICD-10-CM | POA: Diagnosis not present

## 2019-11-03 LAB — CUP PACEART REMOTE DEVICE CHECK
Battery Remaining Longevity: 119 mo
Battery Voltage: 3.01 V
Brady Statistic RV Percent Paced: 0.01 %
Date Time Interrogation Session: 20210526095815
HighPow Impedance: 64 Ohm
Implantable Lead Implant Date: 20180905
Implantable Lead Location: 753860
Implantable Pulse Generator Implant Date: 20180905
Lead Channel Impedance Value: 342 Ohm
Lead Channel Impedance Value: 399 Ohm
Lead Channel Pacing Threshold Amplitude: 0.625 V
Lead Channel Pacing Threshold Pulse Width: 0.4 ms
Lead Channel Sensing Intrinsic Amplitude: 12.5 mV
Lead Channel Sensing Intrinsic Amplitude: 12.5 mV
Lead Channel Setting Pacing Amplitude: 2.5 V
Lead Channel Setting Pacing Pulse Width: 0.4 ms
Lead Channel Setting Sensing Sensitivity: 0.3 mV

## 2019-11-04 NOTE — Progress Notes (Signed)
Remote ICD transmission.   

## 2019-12-22 ENCOUNTER — Other Ambulatory Visit: Payer: Self-pay | Admitting: Cardiovascular Disease

## 2020-01-03 ENCOUNTER — Other Ambulatory Visit: Payer: Self-pay | Admitting: Cardiovascular Disease

## 2020-01-26 ENCOUNTER — Other Ambulatory Visit: Payer: Self-pay

## 2020-01-26 ENCOUNTER — Encounter (HOSPITAL_COMMUNITY): Payer: Self-pay | Admitting: *Deleted

## 2020-01-26 ENCOUNTER — Emergency Department (HOSPITAL_COMMUNITY): Payer: Medicare Other

## 2020-01-26 ENCOUNTER — Inpatient Hospital Stay (HOSPITAL_COMMUNITY)
Admission: EM | Admit: 2020-01-26 | Discharge: 2020-01-31 | DRG: 177 | Disposition: A | Discharge status: Home Health | Payer: Medicare Other | Attending: Internal Medicine | Admitting: Internal Medicine

## 2020-01-26 DIAGNOSIS — Z87442 Personal history of urinary calculi: Secondary | ICD-10-CM

## 2020-01-26 DIAGNOSIS — I2510 Atherosclerotic heart disease of native coronary artery without angina pectoris: Secondary | ICD-10-CM | POA: Diagnosis present

## 2020-01-26 DIAGNOSIS — E1122 Type 2 diabetes mellitus with diabetic chronic kidney disease: Secondary | ICD-10-CM | POA: Diagnosis present

## 2020-01-26 DIAGNOSIS — U071 COVID-19: Principal | ICD-10-CM | POA: Diagnosis present

## 2020-01-26 DIAGNOSIS — N179 Acute kidney failure, unspecified: Secondary | ICD-10-CM | POA: Diagnosis not present

## 2020-01-26 DIAGNOSIS — Z79899 Other long term (current) drug therapy: Secondary | ICD-10-CM

## 2020-01-26 DIAGNOSIS — Z7982 Long term (current) use of aspirin: Secondary | ICD-10-CM

## 2020-01-26 DIAGNOSIS — Z95810 Presence of automatic (implantable) cardiac defibrillator: Secondary | ICD-10-CM | POA: Diagnosis present

## 2020-01-26 DIAGNOSIS — E039 Hypothyroidism, unspecified: Secondary | ICD-10-CM | POA: Diagnosis present

## 2020-01-26 DIAGNOSIS — Z825 Family history of asthma and other chronic lower respiratory diseases: Secondary | ICD-10-CM

## 2020-01-26 DIAGNOSIS — I130 Hypertensive heart and chronic kidney disease with heart failure and stage 1 through stage 4 chronic kidney disease, or unspecified chronic kidney disease: Secondary | ICD-10-CM | POA: Diagnosis present

## 2020-01-26 DIAGNOSIS — E860 Dehydration: Secondary | ICD-10-CM | POA: Diagnosis present

## 2020-01-26 DIAGNOSIS — E1142 Type 2 diabetes mellitus with diabetic polyneuropathy: Secondary | ICD-10-CM | POA: Diagnosis present

## 2020-01-26 DIAGNOSIS — E119 Type 2 diabetes mellitus without complications: Secondary | ICD-10-CM

## 2020-01-26 DIAGNOSIS — J9601 Acute respiratory failure with hypoxia: Secondary | ICD-10-CM | POA: Diagnosis present

## 2020-01-26 DIAGNOSIS — Z955 Presence of coronary angioplasty implant and graft: Secondary | ICD-10-CM

## 2020-01-26 DIAGNOSIS — Z20822 Contact with and (suspected) exposure to covid-19: Secondary | ICD-10-CM | POA: Diagnosis present

## 2020-01-26 DIAGNOSIS — E785 Hyperlipidemia, unspecified: Secondary | ICD-10-CM | POA: Diagnosis present

## 2020-01-26 DIAGNOSIS — R9431 Abnormal electrocardiogram [ECG] [EKG]: Secondary | ICD-10-CM | POA: Diagnosis present

## 2020-01-26 DIAGNOSIS — I10 Essential (primary) hypertension: Secondary | ICD-10-CM | POA: Diagnosis present

## 2020-01-26 DIAGNOSIS — N183 Chronic kidney disease, stage 3 unspecified: Secondary | ICD-10-CM | POA: Diagnosis present

## 2020-01-26 DIAGNOSIS — I252 Old myocardial infarction: Secondary | ICD-10-CM

## 2020-01-26 DIAGNOSIS — D6959 Other secondary thrombocytopenia: Secondary | ICD-10-CM | POA: Diagnosis present

## 2020-01-26 DIAGNOSIS — I255 Ischemic cardiomyopathy: Secondary | ICD-10-CM | POA: Diagnosis present

## 2020-01-26 DIAGNOSIS — Z87891 Personal history of nicotine dependence: Secondary | ICD-10-CM

## 2020-01-26 DIAGNOSIS — J1282 Pneumonia due to coronavirus disease 2019: Secondary | ICD-10-CM | POA: Diagnosis present

## 2020-01-26 DIAGNOSIS — N1831 Chronic kidney disease, stage 3a: Secondary | ICD-10-CM | POA: Diagnosis present

## 2020-01-26 DIAGNOSIS — I5022 Chronic systolic (congestive) heart failure: Secondary | ICD-10-CM | POA: Diagnosis present

## 2020-01-26 LAB — CBC
HCT: 43.1 % (ref 39.0–52.0)
Hemoglobin: 14.4 g/dL (ref 13.0–17.0)
MCH: 32.5 pg (ref 26.0–34.0)
MCHC: 33.4 g/dL (ref 30.0–36.0)
MCV: 97.3 fL (ref 80.0–100.0)
Platelets: 148 10*3/uL — ABNORMAL LOW (ref 150–400)
RBC: 4.43 MIL/uL (ref 4.22–5.81)
RDW: 11.7 % (ref 11.5–15.5)
WBC: 7 10*3/uL (ref 4.0–10.5)
nRBC: 0 % (ref 0.0–0.2)

## 2020-01-26 LAB — BASIC METABOLIC PANEL
Anion gap: 12 (ref 5–15)
BUN: 34 mg/dL — ABNORMAL HIGH (ref 8–23)
CO2: 26 mmol/L (ref 22–32)
Calcium: 9.4 mg/dL (ref 8.9–10.3)
Chloride: 99 mmol/L (ref 98–111)
Creatinine, Ser: 2.61 mg/dL — ABNORMAL HIGH (ref 0.61–1.24)
GFR calc Af Amer: 27 mL/min — ABNORMAL LOW (ref >60)
GFR calc non Af Amer: 23 mL/min — ABNORMAL LOW (ref >60)
Glucose, Bld: 163 mg/dL — ABNORMAL HIGH (ref 70–99)
Potassium: 4.1 mmol/L (ref 3.5–5.1)
Sodium: 137 mmol/L (ref 135–145)

## 2020-01-26 LAB — HEPATIC FUNCTION PANEL
ALT: 18 U/L (ref 0–44)
AST: 40 U/L (ref 15–41)
Albumin: 3.4 g/dL — ABNORMAL LOW (ref 3.5–5.0)
Alkaline Phosphatase: 46 U/L (ref 38–126)
Bilirubin, Direct: 0.2 mg/dL (ref 0.0–0.2)
Indirect Bilirubin: 0.8 mg/dL (ref 0.3–0.9)
Total Bilirubin: 1 mg/dL (ref 0.3–1.2)
Total Protein: 6.7 g/dL (ref 6.5–8.1)

## 2020-01-26 LAB — LACTATE DEHYDROGENASE: LDH: 303 U/L — ABNORMAL HIGH (ref 98–192)

## 2020-01-26 LAB — URINALYSIS, ROUTINE W REFLEX MICROSCOPIC
Bilirubin Urine: NEGATIVE
Glucose, UA: NEGATIVE mg/dL
Hgb urine dipstick: NEGATIVE
Ketones, ur: NEGATIVE mg/dL
Leukocytes,Ua: NEGATIVE
Nitrite: NEGATIVE
Protein, ur: NEGATIVE mg/dL
Specific Gravity, Urine: 1.014 (ref 1.005–1.030)
pH: 5 (ref 5.0–8.0)

## 2020-01-26 LAB — FIBRINOGEN: Fibrinogen: 626 mg/dL — ABNORMAL HIGH (ref 210–475)

## 2020-01-26 LAB — SARS CORONAVIRUS 2 BY RT PCR (HOSPITAL ORDER, PERFORMED IN ~~LOC~~ HOSPITAL LAB): SARS Coronavirus 2: POSITIVE — AB

## 2020-01-26 LAB — TRIGLYCERIDES: Triglycerides: 92 mg/dL (ref <150)

## 2020-01-26 LAB — D-DIMER, QUANTITATIVE: D-Dimer, Quant: 1.11 ug/mL-FEU — ABNORMAL HIGH (ref 0.00–0.50)

## 2020-01-26 MED ORDER — ASPIRIN EC 81 MG PO TBEC: 81.0000 mg | DELAYED_RELEASE_TABLET | ORAL | Status: DC

## 2020-01-26 MED ORDER — SODIUM CHLORIDE 0.9 % IV SOLN
1000.0000 mL | INTRAVENOUS | Status: DC
  Administered 2020-01-26 – 2020-01-28 (×2): 1000 mL via INTRAVENOUS

## 2020-01-26 MED ORDER — ONDANSETRON HCL 4 MG PO TABS: 4.0000 mg | ORAL_TABLET | Freq: Four times a day (QID) | ORAL | Status: DC | PRN

## 2020-01-26 MED ORDER — ONDANSETRON HCL 4 MG/2ML IJ SOLN: 4.0000 mg | Freq: Four times a day (QID) | INTRAMUSCULAR | Status: DC | PRN

## 2020-01-26 MED ORDER — ATORVASTATIN CALCIUM 80 MG PO TABS
80.0000 mg | ORAL_TABLET | Freq: Every day | ORAL | Status: DC
  Administered 2020-01-27 – 2020-01-31 (×5): 80 mg via ORAL
  Filled 2020-01-26 (×5): qty 1

## 2020-01-26 MED ORDER — CARVEDILOL 12.5 MG PO TABS
12.5000 mg | ORAL_TABLET | Freq: Two times a day (BID) | ORAL | Status: DC
  Administered 2020-01-27 – 2020-01-31 (×7): 12.5 mg via ORAL
  Filled 2020-01-26 (×4): qty 1
  Filled 2020-01-26 (×2): qty 4
  Filled 2020-01-26 (×3): qty 1

## 2020-01-26 MED ORDER — NITROGLYCERIN 0.4 MG SL SUBL: 0.4000 mg | SUBLINGUAL_TABLET | SUBLINGUAL | Status: DC | PRN

## 2020-01-26 MED ORDER — ENOXAPARIN SODIUM 40 MG/0.4ML ~~LOC~~ SOLN
40.0000 mg | SUBCUTANEOUS | Status: DC
  Administered 2020-01-27 – 2020-01-31 (×5): 40 mg via SUBCUTANEOUS
  Filled 2020-01-26 (×5): qty 0.4

## 2020-01-26 MED ORDER — OMEGA-3-ACID ETHYL ESTERS 1 G PO CAPS
1.0000 g | ORAL_CAPSULE | Freq: Every day | ORAL | Status: DC
  Administered 2020-01-27 – 2020-01-31 (×5): 1 g via ORAL
  Filled 2020-01-26 (×5): qty 1

## 2020-01-26 NOTE — ED Provider Notes (Signed)
[facility] EMERGENCY DEPARTMENT Provider Note   CSN: [id number] Arrival date & time: 01/26/20  1459     History Chief Complaint  Patient presents with  . Weakness  . Illness    Kevin Patel is a 74 y.o. male.  The history is provided by the patient and medical records. No language interpreter was used.  Weakness Illness  Kevin Patel is a 74 y.o. male who presents to the Emergency Department complaining of sick.  He has been sick for a week and a half with body aches, cough, poor appetite, fevers.  Has mild diarrhea. He was in contact with his daughter two weeks ago, who was diagnosed with COVID-19. He was around her for less than two minutes. He denies any vomiting, dysuria, chest pain. He lives with his wife who is also ill. He has been compliant with his home medications. Symptoms are moderate to severe, constant, worsening.  He has not been vaccinated for COVID-19.  Past Medical History:  Diagnosis Date  . AICD (automatic cardioverter/defibrillator) present 02-22-17  . Chronic systolic HF (heart failure) (HCC)   . Coronary artery disease   . Diabetes mellitus with coincident hypertension (HCC) 09/09/2016  . History of kidney stones   . HTN (hypertension)   . Hyperlipidemia   . Ischemic cardiomyopathy   . MI (myocardial infarction) (HCC) 1998   myoview 09/25/2006-no ischemia, EF 46%  . Mural thrombus of heart    on coumadin until follow up echo with contrast in Sept 13, 2012 which showed no thrombus and coumadin was d/c    Patient Active Problem List   Diagnosis Date Noted  . ARF (acute renal failure) (HCC) 01/26/2020  . Recurrent UTI 03/25/2018  . Anemia, chronic disease 09/10/2017  . Dysuria 09/10/2017  . At risk for sudden cardiac death 02/22/2017  . Chronic systolic heart failure (HCC) 02-22-2017  . ICD (implantable cardioverter-defibrillator) in place 02/22/17  . Hypothyroidism (acquired) 09/11/2016  . CKD (chronic kidney disease) stage  3, GFR 30-59 ml/min 09/10/2016  . History of MI (myocardial infarction) 09/10/2016  . Diabetes mellitus with coincident hypertension (HCC) 09/09/2016  . Acute kidney injury (HCC) 09/07/2016  . 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 NSTEMI (non-ST elevated myocardial infarction) (HCC) 09/05/2016  . Unstable angina (HCC) 09/05/2016  . Class 1 obesity due to excess calories with serious comorbidity and body mass index (BMI) of 31.0 to 31.9 in adult 08/11/2015  . Coronary arteriosclerosis 08/11/2015  . Diabetes, polyneuropathy (HCC) 08/11/2015  . Elevated PSA 08/11/2015  . Hyperlipidemia 08/11/2015  . Long-term use of high-risk medication 08/11/2015  . Osteoarthritis 08/11/2015  . Ischemic cardiomyopathy 2013-02-22  . Essential hypertension 02/22/2013  . Dyslipidemia 2013-02-22    Past Surgical History:  Procedure Laterality Date  . CORONARY ANGIOPLASTY  1998   PCI  . CORONARY STENT INTERVENTION N/A 09/05/2016   Procedure: Coronary Stent Intervention;  Surgeon: [name], MD;  Location: [facility] INVASIVE [facility];  Service: Cardiovascular;  Laterality: N/A;  . ICD IMPLANT N/A 2017-02-22   Procedure: ICD Implant;  Surgeon: [name], MD;  Location: [facility] INVASIVE [facility];  Service: Cardiovascular;  Laterality: N/A;  . INTRAVASCULAR ULTRASOUND/IVUS N/A 09/05/2016   Procedure: Intravascular Ultrasound/IVUS;  Surgeon: [name], MD;  Location: [facility] INVASIVE [facility];  Service: Cardiovascular;  Laterality: N/A;  . LEFT HEART CATH AND CORONARY ANGIOGRAPHY N/A 09/05/2016   Procedure: Left Heart Cath and Coronary Angiography;  Surgeon: [name],  MD;  Location: [facility] INVASIVE CV LAB;  Service: Cardiovascular;  Laterality: N/A;       Family History  Problem Relation Age of Onset  . Emphysema Father     Social History   Tobacco Use  . Smoking status: Former Smoker    Quit date: 02/12/1993    Years since quitting: 26.9  . Smokeless  tobacco: Never Used  Vaping Use  . Vaping Use: Never used  Substance Use Topics  . Alcohol use: No  . Drug use: No    Home Medications Prior to Admission medications   Medication Sig Start Date End Date Taking? Authorizing Provider  acetaminophen (TYLENOL) 325 MG tablet Take 2 tablets (650 mg total) by mouth every 4 (four) hours as needed for headache or mild pain. 09/07/16   Abelino Derrick, PA-C  aspirin 81 MG tablet Take 81 mg by mouth every morning.     [provider]  atorvastatin (LIPITOR) 80 MG tablet Take 1 tablet by mouth once daily Patient taking differently: Take 80 mg by mouth daily.  08/11/19   Croitoru, Mihai, MD  carvedilol (COREG) 12.5 MG tablet Take 1 tablet by mouth twice daily Patient taking differently: Take 12.5 mg by mouth 2 (two) times daily with a meal.  01/03/20   Croitoru, Mihai, MD  co-enzyme Q-10 30 MG capsule Take 30 mg by mouth daily.     [provider]  losartan-hydrochlorothiazide Mauri Reading) 100-12.5 MG tablet Take 1 tablet by mouth once daily 12/23/19   Croitoru, Mihai, MD  Multiple Vitamin (MULTIVITAMIN) capsule Take 1 capsule by mouth daily.    [provider]  nitroGLYCERIN (NITROSTAT) 0.4 MG SL tablet Place 0.4 mg under the tongue every 5 (five) minutes as needed for chest pain.  11/24/18   [provider]  omega-3 acid ethyl esters (LOVAZA) 1 g capsule Take 1 g by mouth daily.    [provider]  vitamin C (ASCORBIC ACID) 500 MG tablet Take 500 mg by mouth daily.    [provider]  vitamin E 1000 UNIT capsule Take 1,000 Units by mouth daily.     [provider]    Allergies    Patient has no known allergies.  Review of Systems   Review of Systems  Neurological: Positive for weakness.  All other systems reviewed and are negative.   Physical Exam Updated Vital Signs BP 122/65 (BP Location: Left Arm)   Pulse 69   Temp 98.4 F (36.9 C) (Oral)   Resp 18   Ht 6\' 4"  (1.93 m)   Wt 97.5 kg    SpO2 98%   BMI 26.17 kg/m   Physical Exam Vitals and nursing note reviewed.  Constitutional:      Appearance: He is well-developed.  HENT:     Head: Normocephalic and atraumatic.  Cardiovascular:     Rate and Rhythm: Normal rate and regular rhythm.     Heart sounds: No murmur heard.   Pulmonary:     Effort: Pulmonary effort is normal. No respiratory distress.     Comments: Fine crackles in bilateral bases. Abdominal:     Palpations: Abdomen is soft.     Tenderness: There is no abdominal tenderness. There is no guarding or rebound.  Musculoskeletal:        General: No swelling or tenderness.  Skin:    General: Skin is warm and dry.  Neurological:     Mental Status: He is alert and oriented to person, place, and time.  Psychiatric:        Behavior: Behavior normal.     ED Results / Procedures / Treatments   Labs (all labs ordered are listed, but only abnormal results are displayed) Labs Reviewed  SARS CORONAVIRUS 2 BY RT PCR (HOSPITAL ORDER, PERFORMED IN [facility]) - Abnormal; Notable for the following components:      Result Value   SARS Coronavirus 2 POSITIVE (*)    All other components within normal limits  BASIC METABOLIC PANEL - Abnormal; Notable for the following components:   Glucose, Bld 163 (*)    BUN 34 (*)    Creatinine, Ser 2.61 (*)    GFR calc non Af Amer 23 (*)    GFR calc Af Amer 27 (*)    All other components within normal limits  CBC - Abnormal; Notable for the following components:   Platelets 148 (*)    All other components within normal limits  URINALYSIS, ROUTINE W REFLEX MICROSCOPIC - Abnormal; Notable for the following components:   APPearance HAZY (*)    All other components within normal limits  HEPATIC FUNCTION PANEL - Abnormal; Notable for the following components:   Albumin 3.4 (*)    All other components within normal limits  CULTURE, BLOOD (ROUTINE X 2)  CULTURE, BLOOD (ROUTINE X 2)  LACTIC ACID, PLASMA  D-DIMER,  QUANTITATIVE (NOT AT [facility])  PROCALCITONIN  LACTATE DEHYDROGENASE  FERRITIN  TRIGLYCERIDES  FIBRINOGEN  C-REACTIVE PROTEIN  CBG MONITORING, ED    EKG EKG Interpretation  Date/Time:  Wednesday January 26 2020 15:12:01 EDT Ventricular Rate:  72 PR Interval:  158 QRS Duration: 98 QT Interval:  422 QTC Calculation: 462 R Axis:   -18 Text Interpretation: Sinus rhythm with frequent Premature ventricular complexes in a pattern of bigeminy Left ventricular hypertrophy with repolarization abnormality ( R in aVL , Cornell product ) Abnormal ECG Confirmed by Tilden Fossa [id number]) on 01/26/2020 8:03:52 PM   Radiology DG Chest Port 1 View  Result Date: 01/26/2020 CLINICAL DATA:  COVID-19 positive, shortness of breath, weakness EXAM: PORTABLE CHEST 1 VIEW COMPARISON:  02/13/2017 FINDINGS: Single frontal view of the chest demonstrates stable single lead pacer. Cardiac silhouette is unremarkable. Bibasilar interstitial prominence is noted, with superimposed patchy airspace disease at the lung bases. No effusion or pneumothorax. IMPRESSION: 1. Bibasilar ground-glass airspace disease superimposed upon interstitial prominence, consistent with multifocal COVID-19 pneumonia. Electronically Signed   By: Sharlet Salina M.D.   On: 01/26/2020 20:58    Procedures Procedures (including critical care time)  Medications Ordered in ED Medications  0.9 %  sodium chloride infusion (1,000 mLs Intravenous New Bag/Given 01/26/20 2250)    ED Course  I have reviewed the triage vital signs and the nursing notes.  Pertinent labs & imaging results that were available during my care of the patient were reviewed by me and considered in my medical decision making (see chart for details).    MDM Rules/Calculators/A&P                         Patient here for evaluation of weakness, body aches, poor appetite. He is positive for COVID-19 infection. He did have hypotension on ED presentation, blood pressure improved  prior to IV fluid administration. Labs are consistent with acute kidney injury, likely secondary to dehydration. He was treated with gentle fluid hydration, plan to admit for further treatment. Patient updated findings of studies recommendation for admission and he is in agreement treatment  plan. Hospitalist consulted for admission.  Final Clinical Impression(s) / ED Diagnoses Final diagnoses:  COVID-19 virus infection  AKI (acute kidney injury) (HCC)  Dehydration    Rx / DC Orders ED Discharge Orders    None       Tilden Fossa, MD 01/26/20 2332

## 2020-01-26 NOTE — ED Triage Notes (Addendum)
Pt states that he has been sick for a week and a half. Pt reports feeling very weak and URI symptoms and lack of appetite.  Pt reports that he was briefly around his daughter who had covid.  Wife has similar symptoms neither has been vaccinated or tested.

## 2020-01-26 NOTE — H&P (Signed)
History and Physical    Kevin Patel:[id number] DOB: 11/25/1945 DOA: 01/26/2020  PCP: [name]., MD  Patient coming from: Home.  Chief Complaint: Weakness poor appetite.  HPI: Kevin Patel is a 74 y.o. male with history of ischemic cardiomyopathy last EF measured was 30 to 35% status post ICD, CAD status post stenting diabetes mellitus hypertension hyperlipidemia presents to the ER because of increasing weakness fatigue and poor appetite.  Patient has been having the symptoms for almost 10 days now.  Patient states he has been in contact with his daughter was positive for Covid infection.  His wife also has similar symptoms.  Denies any chest pain shortness of breath has had diarrhea off and on for last few days.  ED Course: In the ER patient was not febrile or hypoxic.  Appears generally weak.  Nonfocal.  Covid test was positive.  Chest x-ray shows bilateral infiltrates consistent with Covid infection.  CRP is 3.8 D-dimer is 1.1.  Patient labs are remarkable for acute renal failure with creatinine of 2.6 previous one about a year ago was 1.3.  Patient also has mild thrombocytopenia 148 which is new.  Patient is started on IV fluids for acute renal failure admitted for further observation.  Review of Systems: As per HPI, rest all negative.   Past Medical History:  Diagnosis Date  . AICD (automatic cardioverter/defibrillator) present 02/12/2017  . Chronic systolic HF (heart failure) (HCC)   . Coronary artery disease   . Diabetes mellitus with coincident hypertension (HCC) 09/09/2016  . History of kidney stones   . HTN (hypertension)   . Hyperlipidemia   . Ischemic cardiomyopathy   . MI (myocardial infarction) (HCC) 1998   myoview 09/25/2006-no ischemia, EF 46%  . [place] thrombus of heart    on coumadin until follow up echo with contrast in Sept 13, 2012 which showed no thrombus and coumadin was d/c    Past Surgical History:  Procedure Laterality Date  . CORONARY  ANGIOPLASTY  1998   PCI  . CORONARY STENT INTERVENTION N/A 09/05/2016   Procedure: Coronary Stent Intervention;  Surgeon: [name], MD;  Location: [facility] INVASIVE CV LAB;  Service: Cardiovascular;  Laterality: N/A;  . ICD IMPLANT N/A 02/12/2017   Procedure: ICD Implant;  Surgeon: [name], MD;  Location: [facility] INVASIVE CV LAB;  Service: Cardiovascular;  Laterality: N/A;  . INTRAVASCULAR ULTRASOUND/IVUS N/A 09/05/2016   Procedure: Intravascular Ultrasound/IVUS;  Surgeon: [name], MD;  Location: [facility] INVASIVE CV LAB;  Service: Cardiovascular;  Laterality: N/A;  . LEFT HEART CATH AND CORONARY ANGIOGRAPHY N/A 09/05/2016   Procedure: Left Heart Cath and Coronary Angiography;  Surgeon: [name], MD;  Location: [facility] INVASIVE CV LAB;  Service: Cardiovascular;  Laterality: N/A;     reports that he quit smoking about 26 years ago. He has never used smokeless tobacco. He reports that he does not drink alcohol and does not use drugs.  No Known Allergies  Family History  Problem Relation Age of Onset  . Emphysema Father     Prior to Admission medications   Medication Sig Start Date End Date Taking? Authorizing Provider  acetaminophen (TYLENOL) 325 MG tablet Take 2 tablets (650 mg total) by mouth every 4 (four) hours as needed for headache or mild pain. 09/07/16   Abelino Derrick, PA-C  aspirin 81 MG tablet Take 81 mg by mouth every morning.     [provider]  atorvastatin (LIPITOR) 80 MG tablet Take  1 tablet by mouth once daily Patient taking differently: Take 80 mg by mouth daily.  08/11/19   Croitoru, Mihai, MD  carvedilol (COREG) 12.5 MG tablet Take 1 tablet by mouth twice daily Patient taking differently: Take 12.5 mg by mouth 2 (two) times daily with a meal.  01/03/20   Croitoru, Mihai, MD  co-enzyme Q-10 30 MG capsule Take 30 mg by mouth daily.     [provider]  losartan-hydrochlorothiazide Mauri Reading) 100-12.5 MG tablet Take 1 tablet by mouth  once daily 12/23/19   Croitoru, Mihai, MD  Multiple Vitamin (MULTIVITAMIN) capsule Take 1 capsule by mouth daily.    [provider]  nitroGLYCERIN (NITROSTAT) 0.4 MG SL tablet Place 0.4 mg under the tongue every 5 (five) minutes as needed for chest pain.  11/24/18   [provider]  omega-3 acid ethyl esters (LOVAZA) 1 g capsule Take 1 g by mouth daily.    [provider]  vitamin C (ASCORBIC ACID) 500 MG tablet Take 500 mg by mouth daily.    [provider]  vitamin E 1000 UNIT capsule Take 1,000 Units by mouth daily.     [provider]    Physical Exam: Constitutional: Moderately built and nourished. Vitals:   01/26/20 1510 01/26/20 1517 01/26/20 2109  BP: (!) 89/46 (!) 88/45 122/65  Pulse: 72  69  Resp: 18  18  Temp: 98.4 F (36.9 C)    TempSrc: Oral    SpO2: 99%  98%  Weight:   97.5 kg  Height:   6\' 4"  (1.93 m)   Eyes: Anicteric no pallor. ENMT: No discharge from the ears eyes nose or mouth. Neck: No mass felt.  No neck rigidity. Respiratory: No rhonchi or crepitations. Cardiovascular: S1-S2 heard. Abdomen: Soft nontender bowel sounds present. Musculoskeletal: No edema. Skin: No rash. Neurologic: Alert awake oriented to time place and person.  Moves all extremities. Psychiatric: Appears normal.  Normal affect.   Labs on Admission: I have personally reviewed following labs and imaging studies  CBC: Recent Labs  Lab 01/26/20 1519  WBC 7.0  HGB 14.4  HCT 43.1  MCV 97.3  PLT 148*   Basic Metabolic Panel: Recent Labs  Lab 01/26/20 1519  NA 137  K 4.1  CL 99  CO2 26  GLUCOSE 163*  BUN 34*  CREATININE 2.61*  CALCIUM 9.4   GFR: Estimated Creatinine Clearance: 30.9 mL/min (A) (by C-G formula based on SCr of 2.61 mg/dL (H)). Liver Function Tests: Recent Labs  Lab 01/26/20 1547  AST 40  ALT 18  ALKPHOS 46  BILITOT 1.0  PROT 6.7  ALBUMIN 3.4*   No results for input(s): LIPASE, AMYLASE in the last 168  hours. No results for input(s): AMMONIA in the last 168 hours. Coagulation Profile: No results for input(s): INR, PROTIME in the last 168 hours. Cardiac Enzymes: No results for input(s): CKTOTAL, CKMB, CKMBINDEX, TROPONINI in the last 168 hours. BNP (last 3 results) No results for input(s): PROBNP in the last 8760 hours. HbA1C: No results for input(s): HGBA1C in the last 72 hours. CBG: No results for input(s): GLUCAP in the last 168 hours. Lipid Profile: Recent Labs    01/26/20 2028  TRIG 92   Thyroid Function Tests: No results for input(s): TSH, T4TOTAL, FREET4, T3FREE, THYROIDAB in the last 72 hours. Anemia Panel: No results for input(s): VITAMINB12, FOLATE, FERRITIN, TIBC, IRON, RETICCTPCT in the last 72 hours. Urine analysis:    Component Value Date/Time   COLORURINE YELLOW 01/26/2020  2218   APPEARANCEUR HAZY (A) 01/26/2020 2218   LABSPEC 1.014 01/26/2020 2218   PHURINE 5.0 01/26/2020 2218   GLUCOSEU NEGATIVE 01/26/2020 2218   HGBUR NEGATIVE 01/26/2020 2218   BILIRUBINUR NEGATIVE 01/26/2020 2218   KETONESUR NEGATIVE 01/26/2020 2218   PROTEINUR NEGATIVE 01/26/2020 2218   NITRITE NEGATIVE 01/26/2020 2218   LEUKOCYTESUR NEGATIVE 01/26/2020 2218   Sepsis Labs: @LABRCNTIP (procalcitonin:4,lacticidven:4) ) Recent Results (from the past 240 hour(s))  SARS Coronavirus 2 by RT PCR (hospital order, performed in [facility] hospital lab) Nasopharyngeal Nasopharyngeal Swab     Status: Abnormal   Collection Time: 01/26/20  3:28 PM   Specimen: Nasopharyngeal Swab  Result Value Ref Range Status   SARS Coronavirus 2 POSITIVE (A) NEGATIVE Final    Comment: RESULT CALLED TO, READ BACK BY AND VERIFIED WITH: S,BERTRAND @1635  01/26/20 EB (NOTE) SARS-CoV-2 target nucleic acids are DETECTED  SARS-CoV-2 RNA is generally detectable in upper respiratory specimens  during the acute phase of infection.  Positive results are indicative  of the presence of the identified virus, but do not  rule out bacterial infection or co-infection with other pathogens not detected by the test.  Clinical correlation with patient history and  other diagnostic information is necessary to determine patient infection status.  The expected result is negative.  Fact Sheet for Patients:   BoilerBrush.com.cy   Fact Sheet for Healthcare Providers:   https://pope.com/    This test is not yet approved or cleared by the [place] FDA and  has been authorized for detection and/or diagnosis of SARS-CoV-2 by FDA under an Emergency Use Authorization (EUA).  This EUA will remain in effect (meaning this test can  be used) for the duration of  the COVID-19 declaration under Section 564(b)(1) of the Act, 21 U.S.C. section 360-bbb-3(b)(1), unless the authorization is terminated or revoked sooner.  Performed at [facility], [address] [place], [place] [address]      Radiological Exams on Admission: DG Chest Port 1 View  Result Date: 01/26/2020 CLINICAL DATA:  COVID-19 positive, shortness of breath, weakness EXAM: PORTABLE CHEST 1 VIEW COMPARISON:  02/13/2017 FINDINGS: Single frontal view of the chest demonstrates stable single lead pacer. Cardiac silhouette is unremarkable. Bibasilar interstitial prominence is noted, with superimposed patchy airspace disease at the lung bases. No effusion or pneumothorax. IMPRESSION: 1. Bibasilar ground-glass airspace disease superimposed upon interstitial prominence, consistent with multifocal COVID-19 pneumonia. Electronically Signed   By: Sharlet Salina M.D.   On: 01/26/2020 20:58    EKG: Independently reviewed.  Sinus rhythm with PVCs.  Assessment/Plan Principal Problem:   ARF (acute renal failure) (HCC) Active Problems:   Ischemic cardiomyopathy   Essential hypertension   Diabetes mellitus with coincident hypertension (HCC)   ICD (implantable cardioverter-defibrillator) in place   CKD (chronic  kidney disease) stage 3, GFR 30-59 ml/min   Hypothyroidism (acquired)   COVID-19 virus infection    1. Acute renal failure likely from poor oral intake added on patient was taking ACE inhibitor and diuretics.  Will hold off patient's lisinopril and hydrochlorothiazide continue with gentle hydration.  UA is largely unremarkable.  Follow intake output and daily weights.  Patient symptoms of poor appetite and fatigue could be from Covid infection. 2. COVID-19 infection presently not hypoxic and not febrile.  We will continue to monitor closely.  Follow inflammatory markers respiratory status.  Discussed with patient about monoclonal antibodies patient is agreeable to take that.  Will discuss with pharmacy. 3. History of CAD status post  stenting denies any chest pain.  We will continue beta-blockers statins and aspirin. 4. History of ischemic cardiomyopathy status post ICD last EF measured was around 30 to 35% in 2018.  Holding of lisinopril hydrochlorothiazide now due to renal failure.  Presently receiving gentle hydration. 5. Thrombocytopenia appears to be new likely from Covid infection.   DVT prophylaxis: Lovenox. Code Status: Full code. Family Communication: Discussed with patient. Disposition Plan: Home. Consults called: None. Admission status: Observation.   Eduard Clos MD [facility] Pager [phone].  If 7PM-7AM, please contact night-coverage [email or web address] Password [facility]  01/26/2020, 11:48 PM

## 2020-01-27 DIAGNOSIS — J1282 Pneumonia due to coronavirus disease 2019: Secondary | ICD-10-CM | POA: Diagnosis present

## 2020-01-27 DIAGNOSIS — I252 Old myocardial infarction: Secondary | ICD-10-CM | POA: Diagnosis not present

## 2020-01-27 DIAGNOSIS — E119 Type 2 diabetes mellitus without complications: Secondary | ICD-10-CM | POA: Diagnosis not present

## 2020-01-27 DIAGNOSIS — Z95810 Presence of automatic (implantable) cardiac defibrillator: Secondary | ICD-10-CM | POA: Diagnosis not present

## 2020-01-27 DIAGNOSIS — Z825 Family history of asthma and other chronic lower respiratory diseases: Secondary | ICD-10-CM | POA: Diagnosis not present

## 2020-01-27 DIAGNOSIS — I5022 Chronic systolic (congestive) heart failure: Secondary | ICD-10-CM | POA: Diagnosis present

## 2020-01-27 DIAGNOSIS — Z20822 Contact with and (suspected) exposure to covid-19: Secondary | ICD-10-CM | POA: Diagnosis present

## 2020-01-27 DIAGNOSIS — I2510 Atherosclerotic heart disease of native coronary artery without angina pectoris: Secondary | ICD-10-CM | POA: Diagnosis present

## 2020-01-27 DIAGNOSIS — Z7982 Long term (current) use of aspirin: Secondary | ICD-10-CM | POA: Diagnosis not present

## 2020-01-27 DIAGNOSIS — N1831 Chronic kidney disease, stage 3a: Secondary | ICD-10-CM

## 2020-01-27 DIAGNOSIS — Z955 Presence of coronary angioplasty implant and graft: Secondary | ICD-10-CM | POA: Diagnosis not present

## 2020-01-27 DIAGNOSIS — Z87442 Personal history of urinary calculi: Secondary | ICD-10-CM | POA: Diagnosis not present

## 2020-01-27 DIAGNOSIS — N179 Acute kidney failure, unspecified: Secondary | ICD-10-CM | POA: Diagnosis present

## 2020-01-27 DIAGNOSIS — Z87891 Personal history of nicotine dependence: Secondary | ICD-10-CM | POA: Diagnosis not present

## 2020-01-27 DIAGNOSIS — E1142 Type 2 diabetes mellitus with diabetic polyneuropathy: Secondary | ICD-10-CM | POA: Diagnosis present

## 2020-01-27 DIAGNOSIS — E1122 Type 2 diabetes mellitus with diabetic chronic kidney disease: Secondary | ICD-10-CM | POA: Diagnosis present

## 2020-01-27 DIAGNOSIS — U071 COVID-19: Secondary | ICD-10-CM | POA: Diagnosis present

## 2020-01-27 DIAGNOSIS — E860 Dehydration: Secondary | ICD-10-CM

## 2020-01-27 DIAGNOSIS — I255 Ischemic cardiomyopathy: Secondary | ICD-10-CM | POA: Diagnosis present

## 2020-01-27 DIAGNOSIS — I10 Essential (primary) hypertension: Secondary | ICD-10-CM

## 2020-01-27 DIAGNOSIS — J9601 Acute respiratory failure with hypoxia: Secondary | ICD-10-CM | POA: Diagnosis present

## 2020-01-27 DIAGNOSIS — E039 Hypothyroidism, unspecified: Secondary | ICD-10-CM | POA: Diagnosis present

## 2020-01-27 DIAGNOSIS — I130 Hypertensive heart and chronic kidney disease with heart failure and stage 1 through stage 4 chronic kidney disease, or unspecified chronic kidney disease: Secondary | ICD-10-CM | POA: Diagnosis present

## 2020-01-27 DIAGNOSIS — E785 Hyperlipidemia, unspecified: Secondary | ICD-10-CM | POA: Diagnosis present

## 2020-01-27 DIAGNOSIS — D6959 Other secondary thrombocytopenia: Secondary | ICD-10-CM | POA: Diagnosis present

## 2020-01-27 LAB — CBG MONITORING, ED
Glucose-Capillary: 130 mg/dL — ABNORMAL HIGH (ref 70–99)
Glucose-Capillary: 132 mg/dL — ABNORMAL HIGH (ref 70–99)

## 2020-01-27 LAB — COMPREHENSIVE METABOLIC PANEL
ALT: 20 U/L (ref 0–44)
AST: 37 U/L (ref 15–41)
Albumin: 3.2 g/dL — ABNORMAL LOW (ref 3.5–5.0)
Alkaline Phosphatase: 44 U/L (ref 38–126)
Anion gap: 12 (ref 5–15)
BUN: 36 mg/dL — ABNORMAL HIGH (ref 8–23)
CO2: 24 mmol/L (ref 22–32)
Calcium: 8.7 mg/dL — ABNORMAL LOW (ref 8.9–10.3)
Chloride: 100 mmol/L (ref 98–111)
Creatinine, Ser: 2.22 mg/dL — ABNORMAL HIGH (ref 0.61–1.24)
GFR calc Af Amer: 33 mL/min — ABNORMAL LOW (ref >60)
GFR calc non Af Amer: 28 mL/min — ABNORMAL LOW (ref >60)
Glucose, Bld: 137 mg/dL — ABNORMAL HIGH (ref 70–99)
Potassium: 3.3 mmol/L — ABNORMAL LOW (ref 3.5–5.1)
Sodium: 136 mmol/L (ref 135–145)
Total Bilirubin: 0.9 mg/dL (ref 0.3–1.2)
Total Protein: 6.5 g/dL (ref 6.5–8.1)

## 2020-01-27 LAB — FERRITIN: Ferritin: 782 ng/mL — ABNORMAL HIGH (ref 24–336)

## 2020-01-27 LAB — CBC WITH DIFFERENTIAL/PLATELET
Abs Immature Granulocytes: 0.05 10*3/uL (ref 0.00–0.07)
Basophils Absolute: 0 10*3/uL (ref 0.0–0.1)
Basophils Relative: 0 %
Eosinophils Absolute: 0 10*3/uL (ref 0.0–0.5)
Eosinophils Relative: 0 %
HCT: 40.2 % (ref 39.0–52.0)
Hemoglobin: 13.7 g/dL (ref 13.0–17.0)
Immature Granulocytes: 1 %
Lymphocytes Relative: 16 %
Lymphs Abs: 1 10*3/uL (ref 0.7–4.0)
MCH: 33 pg (ref 26.0–34.0)
MCHC: 34.1 g/dL (ref 30.0–36.0)
MCV: 96.9 fL (ref 80.0–100.0)
Monocytes Absolute: 0.3 10*3/uL (ref 0.1–1.0)
Monocytes Relative: 5 %
Neutro Abs: 4.9 10*3/uL (ref 1.7–7.7)
Neutrophils Relative %: 78 %
Platelets: ADEQUATE 10*3/uL (ref 150–400)
RBC: 4.15 MIL/uL — ABNORMAL LOW (ref 4.22–5.81)
RDW: 11.7 % (ref 11.5–15.5)
WBC: 6.3 10*3/uL (ref 4.0–10.5)
nRBC: 0 % (ref 0.0–0.2)

## 2020-01-27 LAB — ABO/RH: ABO/RH(D): A POS

## 2020-01-27 LAB — D-DIMER, QUANTITATIVE: D-Dimer, Quant: 1.13 ug/mL-FEU — ABNORMAL HIGH (ref 0.00–0.50)

## 2020-01-27 LAB — C-REACTIVE PROTEIN
CRP: 3.8 mg/dL — ABNORMAL HIGH (ref <1.0)
CRP: 4 mg/dL — ABNORMAL HIGH (ref <1.0)

## 2020-01-27 LAB — PROCALCITONIN: Procalcitonin: <0.1 ng/mL

## 2020-01-27 LAB — LACTIC ACID, PLASMA: Lactic Acid, Venous: 2.4 mmol/L (ref 0.5–1.9)

## 2020-01-27 LAB — BRAIN NATRIURETIC PEPTIDE: B Natriuretic Peptide: 725.4 pg/mL — ABNORMAL HIGH (ref 0.0–100.0)

## 2020-01-27 MED ORDER — DEXAMETHASONE SODIUM PHOSPHATE 10 MG/ML IJ SOLN
6.0000 mg | Freq: Every day | INTRAMUSCULAR | Status: DC
  Administered 2020-01-27 – 2020-01-29 (×3): 6 mg via INTRAVENOUS
  Filled 2020-01-27 (×3): qty 1

## 2020-01-27 MED ORDER — POTASSIUM CHLORIDE CRYS ER 20 MEQ PO TBCR
40.0000 meq | EXTENDED_RELEASE_TABLET | Freq: Once | ORAL | Status: AC
  Administered 2020-01-27: 40 meq via ORAL
  Filled 2020-01-27: qty 2

## 2020-01-27 MED ORDER — SODIUM CHLORIDE 0.9 % IV SOLN
100.0000 mg | Freq: Every day | INTRAVENOUS | Status: AC
  Administered 2020-01-28 – 2020-01-31 (×4): 100 mg via INTRAVENOUS
  Filled 2020-01-27 (×5): qty 20

## 2020-01-27 MED ORDER — ASPIRIN EC 81 MG PO TBEC
81.0000 mg | DELAYED_RELEASE_TABLET | Freq: Every day | ORAL | Status: DC
  Administered 2020-01-27 – 2020-01-31 (×5): 81 mg via ORAL
  Filled 2020-01-27 (×5): qty 1

## 2020-01-27 MED ORDER — SODIUM CHLORIDE 0.9 % IV SOLN
200.0000 mg | Freq: Once | INTRAVENOUS | Status: AC
  Administered 2020-01-27: 200 mg via INTRAVENOUS
  Filled 2020-01-27: qty 200

## 2020-01-27 MED ORDER — INSULIN ASPART 100 UNIT/ML ~~LOC~~ SOLN
0.0000 [IU] | Freq: Three times a day (TID) | SUBCUTANEOUS | Status: DC
  Administered 2020-01-27 – 2020-01-28 (×2): 1 [IU] via SUBCUTANEOUS
  Administered 2020-01-28 – 2020-01-29 (×2): 2 [IU] via SUBCUTANEOUS
  Administered 2020-01-29 – 2020-01-30 (×3): 1 [IU] via SUBCUTANEOUS
  Administered 2020-01-30: 2 [IU] via SUBCUTANEOUS
  Administered 2020-01-30: 3 [IU] via SUBCUTANEOUS

## 2020-01-27 NOTE — Progress Notes (Signed)
                                  PROGRESS NOTE                                                                                                                                                                                                             Patient Demographics:    Kevin Patel, is a 74 y.o. male, DOB - 03/05/1946, MRN:[id number]  Outpatient Primary MD for the patient is Grisso, Greg A., MD    LOS - 0  Admit date - 01/26/2020    Chief Complaint  Patient presents with  . Weakness  . Illness       Brief Narrative  - Kevin Patel is a 74 y.o. male with history of ischemic cardiomyopathy last EF measured was 30 to 35% status post ICD, CAD status post stenting diabetes mellitus hypertension hyperlipidemia presents to the ER because of increasing weakness fatigue and poor appetite, was diagnosed with COVID-19 pneumonia and admitted to the hospital.  Unfortunately he is not vaccinated.   Subjective:    Harrol Mates today has, No headache, No chest pain, No abdominal pain - No Nausea, No new weakness tingling or numbness, mild Cough - SOB.     Assessment  & Plan :     1. Acute Hypoxic Resp. Failure due to Acute Covid 19 Viral Pneumonitis during the ongoing 2020 Covid 19 Pandemic - he is to have moderate disease, is now developing shortness of breath, due to his severe underlying comorbidities we have low margin of error and I will start him on low-dose steroids along with remdesivir and monitor closely.  Encouraged the patient to sit up in chair in the daytime use I-S and flutter valve for pulmonary toiletry and then prone in bed when at night.  Will advance activity and titrate down oxygen as possible.    SpO2: 94 %  Recent Labs  Lab 01/26/20 1519 01/26/20 1528 01/26/20 1547 01/26/20 2028 01/26/20 2228 01/27/20 0519  WBC 7.0  --   --   --   --  6.3  PLT 148*  --   --   --   --  PLATELET CLUMPS NOTED ON SMEAR, COUNT APPEARS ADEQUATE  CRP  --    --   --  3.8*  --  4.0*  AST  --   --  40  --   --  37  ALT  --   --    18  --   --  20  ALKPHOS  --   --  46  --   --  44  BILITOT  --   --  1.0  --   --  0.9  ALBUMIN  --   --  3.4*  --   --  3.2*  DDIMER  --   --   --  1.11*  --  1.13*  PROCALCITON  --   --   --  <0.10  --   --   LATICACIDVEN  --   --   --   --  2.4*  --   SARSCOV2NAA  --  POSITIVE*  --   --   --   --      Hepatic Function Latest Ref Rng & Units 01/27/2020 01/26/2020 07/22/2018  Total Protein 6.5 - 8.1 g/dL 6.5 6.7 6.8  Albumin 3.5 - 5.0 g/dL 3.2(L) 3.4(L) 4.5  AST 15 - 41 U/L 37 40 22  ALT 0 - 44 U/L _0 Alk Phosphatase 38 - 126 U/L 44 46 68  Total Bilirubin 0.3 - 1.2 mg/dL 0.9 1.0 0.7  Bilirubin, Direct 0.0 - 0.2 mg/dL - 0.2 -   2.  History of chronic ischemic cardiomyopathy with chronic systolic heart failure EF 35% s/p AICD placement.  [name] appears compensated will continue combination of aspirin, Coreg and statin for secondary prevention along with monitoring.  3.  CAD.  As above.  4.  Mild COVID-19 viral infection related thrombocytopenia.  Trend and monitor.      Condition - Extremely Guarded  Family Communication  : daughter - [phone] - on 01/27/20  Code Status :  Full  Consults  :  None  Procedures  :  None  PUD Prophylaxis :   Disposition Plan  :    Status is: Observation  The patient will require care spanning > 2 midnights and should be moved to inpatient because: IV treatments appropriate due to intensity of illness or inability to take PO  Dispo: The patient is from: Home              Anticipated d/c is to: Home              Anticipated d/c date is: > 3 days              Patient currently is not medically stable to d/c.  DVT Prophylaxis  :  Lovenox   Lab Results  Component Value Date   PLT  01/27/2020    PLATELET CLUMPS NOTED ON SMEAR, COUNT APPEARS ADEQUATE    Diet :  Diet Order            Diet heart healthy/carb modified Room service appropriate? Yes; Fluid  consistency: Thin; Fluid restriction: 1200 mL Fluid  Diet effective now                  Inpatient Medications  Scheduled Meds: . aspirin EC  81 mg Oral Daily  . atorvastatin  80 mg Oral Daily  . carvedilol  12.5 mg Oral BID WC  . enoxaparin (LOVENOX) injection  40 mg Subcutaneous Q24H  . insulin aspart  0-6 Units Subcutaneous TID WC  . omega-3 acid ethyl esters  1 g Oral Daily   Continuous Infusions: . sodium chloride 1,000 mL (01/26/20 2250)   PRN Meds:.nitroGLYCERIN, [DISCONTINUED] ondansetron **OR** ondansetron (ZOFRAN) IV  Antibiotics  :    Anti-infectives (From admission, onward)   None  Time Spent in minutes  30   Prashant Singh M.D on 01/27/2020 at 12:18 PM  To page go to [email or web address] - password [email or web address]  [facility] -  Office  [phone]   See all Orders from today for further details    Objective:   Vitals:   01/27/20 0752 01/27/20 0816 01/27/20 0826 01/27/20 0905  BP: 129/85 (!) 113/57  131/82  Pulse: 72 70 77 75  Resp: 18 (!) 23 19   Temp:      TempSrc:      SpO2: 92% 90% 94%   Weight:      Height:        Wt Readings from Last 3 Encounters:  01/26/20 97.5 kg  08/09/19 125.7 kg  07/22/18 118.8 kg    No intake or output data in the 24 hours ending 01/27/20 1218   Physical Exam  Awake Alert, No new F.N deficits, Normal affect [place].AT,PERRAL Supple Neck,No JVD, No cervical lymphadenopathy appriciated.  Symmetrical Chest wall movement, Good air movement bilaterally, CTAB RRR,No Gallops,Rubs or new Murmurs, No Parasternal Heave +ve B.Sounds, Abd Soft, No tenderness, No organomegaly appriciated, No rebound - guarding or rigidity. No Cyanosis, Clubbing or edema, No new Rash or bruise      Data Review:    CBC Recent Labs  Lab 01/26/20 1519 01/27/20 0519  WBC 7.0 6.3  HGB 14.4 13.7  HCT 43.1 40.2  PLT 148* PLATELET CLUMPS NOTED ON SMEAR, COUNT APPEARS ADEQUATE  MCV 97.3 96.9  MCH 32.5 33.0  MCHC 33.4 34.1  RDW 11.7  11.7  LYMPHSABS  --  1.0  MONOABS  --  0.3  EOSABS  --  0.0  BASOSABS  --  0.0    Chemistries  Recent Labs  Lab 01/26/20 1519 01/26/20 1547 01/27/20 0519  NA 137  --  136  K 4.1  --  3.3*  CL 99  --  100  CO2 26  --  24  GLUCOSE 163*  --  137*  BUN 34*  --  36*  CREATININE 2.61*  --  2.22*  CALCIUM 9.4  --  8.7*  AST  --  40 37  ALT  --  18 20  ALKPHOS  --  46 44  BILITOT  --  1.0 0.9     ------------------------------------------------------------------------------------------------------------------ Recent Labs    01/26/20 2028  TRIG 92    Lab Results  Component Value Date   HGBA1C 6.6 (H) 07/22/2018   ------------------------------------------------------------------------------------------------------------------ No results for input(s): TSH, T4TOTAL, T3FREE, THYROIDAB in the last 72 hours.  Invalid input(s): FREET3  Cardiac Enzymes No results for input(s): CKMB, TROPONINI, MYOGLOBIN in the last 168 hours.  Invalid input(s): CK ------------------------------------------------------------------------------------------------------------------    Component Value Date/Time   BNP 725.4 (H) 01/27/2020 0519    Micro Results Recent Results (from the past 240 hour(s))  SARS Coronavirus 2 by RT PCR (hospital order, performed in [facility] hospital lab) Nasopharyngeal Nasopharyngeal Swab     Status: Abnormal   Collection Time: 01/26/20  3:28 PM   Specimen: Nasopharyngeal Swab  Result Value Ref Range Status   SARS Coronavirus 2 POSITIVE (A) NEGATIVE Final    Comment: RESULT CALLED TO, READ BACK BY AND VERIFIED WITH: S,BERTRAND @1635 01/26/20 EB (NOTE) SARS-CoV-2 target nucleic acids are DETECTED  SARS-CoV-2 RNA is generally detectable in upper respiratory specimens  during the acute phase of infection.  Positive results are indicative  of the presence of the identified virus, but do not rule out bacterial infection   or co-infection with other pathogens  not detected by the test.  Clinical correlation with patient history and  other diagnostic information is necessary to determine patient infection status.  The expected result is negative.  Fact Sheet for Patients:   StrictlyIdeas.no   Fact Sheet for Healthcare Providers:   BankingDealers.co.za    This test is not yet approved or cleared by the [place] FDA and  has been authorized for detection and/or diagnosis of SARS-CoV-2 by FDA under an Emergency Use Authorization (EUA).  This EUA will remain in effect (meaning this test can  be used) for the duration of  the COVID-19 declaration under Section 564(b)(1) of the Act, 21 U.S.C. section 360-bbb-3(b)(1), unless the authorization is terminated or revoked sooner.  Performed at [facility], [place] [address] [place], [place] [address]   Blood Culture (routine x 2)     Status: None (Preliminary result)   Collection Time: 01/26/20 10:40 PM   Specimen: BLOOD RIGHT HAND  Result Value Ref Range Status   Specimen Description BLOOD RIGHT HAND  Final   Special Requests   Final    BOTTLES DRAWN AEROBIC AND ANAEROBIC Blood Culture adequate volume   Culture   Final    NO GROWTH < 12 HOURS Performed at [facility], [place] [address] [place], [place] [address]    Report Status PENDING  Incomplete  Blood Culture (routine x 2)     Status: None (Preliminary result)   Collection Time: 01/26/20 10:43 PM   Specimen: BLOOD LEFT HAND  Result Value Ref Range Status   Specimen Description BLOOD LEFT HAND  Final   Special Requests   Final    BOTTLES DRAWN AEROBIC AND ANAEROBIC Blood Culture adequate volume   Culture   Final    NO GROWTH < 12 HOURS Performed at [facility], [place] [address] [place], [place] [address]    Report Status PENDING  Incomplete    Radiology Reports DG Chest Port 1 View  Result Date: 01/26/2020 CLINICAL DATA:  COVID-19 positive, shortness of breath,  weakness EXAM: PORTABLE CHEST 1 VIEW COMPARISON:  02/13/2017 FINDINGS: Single frontal view of the chest demonstrates stable single lead pacer. Cardiac silhouette is unremarkable. Bibasilar interstitial prominence is noted, with superimposed patchy airspace disease at the lung bases. No effusion or pneumothorax. IMPRESSION: 1. Bibasilar ground-glass airspace disease superimposed upon interstitial prominence, consistent with multifocal COVID-19 pneumonia. Electronically Signed   By: Randa Ngo M.D.   On: 01/26/2020 20:58

## 2020-01-27 NOTE — ED Notes (Signed)
Pt up to side of bed to eat dinner and then placed in hospital bed. Gown changed and warm blankets provided.

## 2020-01-27 NOTE — ED Notes (Signed)
CBG 132. 

## 2020-01-27 NOTE — ED Notes (Addendum)
CBG 200 

## 2020-01-27 NOTE — ED Notes (Signed)
Ordered Breafast °

## 2020-01-28 LAB — COMPREHENSIVE METABOLIC PANEL
ALT: 19 U/L (ref 0–44)
AST: 38 U/L (ref 15–41)
Albumin: 2.9 g/dL — ABNORMAL LOW (ref 3.5–5.0)
Alkaline Phosphatase: 47 U/L (ref 38–126)
Anion gap: 10 (ref 5–15)
BUN: 34 mg/dL — ABNORMAL HIGH (ref 8–23)
CO2: 24 mmol/L (ref 22–32)
Calcium: 8.6 mg/dL — ABNORMAL LOW (ref 8.9–10.3)
Chloride: 106 mmol/L (ref 98–111)
Creatinine, Ser: 1.44 mg/dL — ABNORMAL HIGH (ref 0.61–1.24)
GFR calc Af Amer: 55 mL/min — ABNORMAL LOW (ref >60)
GFR calc non Af Amer: 48 mL/min — ABNORMAL LOW (ref >60)
Glucose, Bld: 182 mg/dL — ABNORMAL HIGH (ref 70–99)
Potassium: 3.9 mmol/L (ref 3.5–5.1)
Sodium: 140 mmol/L (ref 135–145)
Total Bilirubin: 0.7 mg/dL (ref 0.3–1.2)
Total Protein: 6.4 g/dL — ABNORMAL LOW (ref 6.5–8.1)

## 2020-01-28 LAB — CBC WITH DIFFERENTIAL/PLATELET
Abs Immature Granulocytes: 0.08 10*3/uL — ABNORMAL HIGH (ref 0.00–0.07)
Basophils Absolute: 0 10*3/uL (ref 0.0–0.1)
Basophils Relative: 0 %
Eosinophils Absolute: 0 10*3/uL (ref 0.0–0.5)
Eosinophils Relative: 0 %
HCT: 40.1 % (ref 39.0–52.0)
Hemoglobin: 13.7 g/dL (ref 13.0–17.0)
Immature Granulocytes: 2 %
Lymphocytes Relative: 15 %
Lymphs Abs: 0.8 10*3/uL (ref 0.7–4.0)
MCH: 33.5 pg (ref 26.0–34.0)
MCHC: 34.2 g/dL (ref 30.0–36.0)
MCV: 98 fL (ref 80.0–100.0)
Monocytes Absolute: 0.3 10*3/uL (ref 0.1–1.0)
Monocytes Relative: 6 %
Neutro Abs: 3.9 10*3/uL (ref 1.7–7.7)
Neutrophils Relative %: 77 %
Platelets: 160 10*3/uL (ref 150–400)
RBC: 4.09 MIL/uL — ABNORMAL LOW (ref 4.22–5.81)
RDW: 11.7 % (ref 11.5–15.5)
WBC: 5 10*3/uL (ref 4.0–10.5)
nRBC: 0 % (ref 0.0–0.2)

## 2020-01-28 LAB — MAGNESIUM: Magnesium: 1.6 mg/dL — ABNORMAL LOW (ref 1.7–2.4)

## 2020-01-28 LAB — C-REACTIVE PROTEIN: CRP: 4 mg/dL — ABNORMAL HIGH (ref <1.0)

## 2020-01-28 LAB — BRAIN NATRIURETIC PEPTIDE: B Natriuretic Peptide: 838.5 pg/mL — ABNORMAL HIGH (ref 0.0–100.0)

## 2020-01-28 LAB — D-DIMER, QUANTITATIVE: D-Dimer, Quant: 0.84 ug/mL-FEU — ABNORMAL HIGH (ref 0.00–0.50)

## 2020-01-28 MED ORDER — FUROSEMIDE 20 MG PO TABS: 40.0000 mg | ORAL_TABLET | Freq: Once | ORAL | Status: DC

## 2020-01-28 MED ORDER — MAGNESIUM SULFATE 2 GM/50ML IV SOLN
2.0000 g | Freq: Once | INTRAVENOUS | Status: AC
  Administered 2020-01-28: 2 g via INTRAVENOUS
  Filled 2020-01-28: qty 50

## 2020-01-28 NOTE — ED Notes (Signed)
Pt sleeping soundly. No resp difficulty at this time. Will continue to monitor pt closely.

## 2020-01-28 NOTE — ED Notes (Signed)
Tele   Breakfast ordered  

## 2020-01-28 NOTE — Progress Notes (Addendum)
PROGRESS NOTE                                                                                                                                                                                                             Patient Demographics:    Kevin Patel, is a 74 y.o. male, DOB - [birth year], IWP:[id number]  Outpatient Primary MD for the patient is [name]., MD    LOS - 1  Admit date - 01/26/2020    Chief Complaint  Patient presents with  . Weakness  . Illness       Brief Narrative  - Kevin Patel is a 74 y.o. male with history of ischemic cardiomyopathy last EF measured was 30 to 35% status post ICD, CAD status post stenting diabetes mellitus hypertension hyperlipidemia presents to the ER because of increasing weakness fatigue and poor appetite, was diagnosed with COVID-19 pneumonia and admitted to the hospital.  Unfortunately he is not vaccinated.   Subjective:   Patient in bed, appears comfortable, denies any headache, no fever, no chest pain or pressure, no shortness of breath , no abdominal pain. No focal weakness.   Assessment  & Plan :     1. Acute Hypoxic Resp. Failure due to Acute Covid 19 Viral Pneumonitis during the ongoing 2020 Covid 19 Pandemic - he is to have moderate disease, is now developing shortness of breath, due to his severe underlying comorbidities we have low margin of error and I will start him on low-dose steroids along with remdesivir and monitor closely.  Encouraged the patient to sit up in chair in the daytime use I-S and flutter valve for pulmonary toiletry and then prone in bed when at night.  Will advance activity and titrate down oxygen as possible.    SpO2: 93 %  Recent Labs  Lab 01/26/20 1519 01/26/20 1528 01/26/20 1547 01/26/20 2028 01/26/20 2228 01/27/20 0519 01/28/20 0500  WBC 7.0  --   --   --   --  6.3 5.0  PLT 148*  --   --   --   --  PLATELET CLUMPS NOTED ON  SMEAR, COUNT APPEARS ADEQUATE 160  CRP  --   --   --  3.8*  --  4.0* 4.0*  AST  --   --  40  --   --  37 38  ALT  --   --  18  --   --  20 19  ALKPHOS  --   --  46  --   --  44 47  BILITOT  --   --  1.0  --   --  0.9 0.7  ALBUMIN  --   --  3.4*  --   --  3.2* 2.9*  DDIMER  --   --   --  1.11*  --  1.13* 0.84*  PROCALCITON  --   --   --  <0.10  --   --   --   LATICACIDVEN  --   --   --   --  2.4*  --   --   SARSCOV2NAA  --  POSITIVE*  --   --   --   --   --     2.  History of chronic ischemic cardiomyopathy with chronic systolic heart failure EF 35% s/p AICD placement.  [name] appears compensated will continue combination of aspirin, Coreg and statin for secondary prevention along with monitoring.  3.  CAD.  As above.  4.  Mild COVID-19 viral infection related thrombocytopenia.  Trend and monitor.  5.  Hypomagnesemia.  Replaced.  6.  AKI on CKD 3A.  Almost resolved after hydration and close to baseline.    Condition - Extremely Guarded  Family Communication  : daughter - [phone] - on 01/27/20  Code Status :  Full  Consults  :  None  Procedures  :  None  PUD Prophylaxis :   Disposition Plan  :    Status is: Observation  The patient will require care spanning > 2 midnights and should be moved to inpatient because: IV treatments appropriate due to intensity of illness or inability to take PO  Dispo: The patient is from: Home              Anticipated d/c is to: Home              Anticipated d/c date is: > 3 days              Patient currently is not medically stable to d/c.  DVT Prophylaxis  :  Lovenox   Lab Results  Component Value Date   PLT 160 01/28/2020    Diet :  Diet Order            Diet heart healthy/carb modified Room service appropriate? Yes; Fluid consistency: Thin; Fluid restriction: 1200 mL Fluid  Diet effective now                  Inpatient Medications  Scheduled Meds: . aspirin EC  81 mg Oral Daily  . atorvastatin  80 mg Oral Daily   . carvedilol  12.5 mg Oral BID WC  . dexamethasone (DECADRON) injection  6 mg Intravenous Daily  . enoxaparin (LOVENOX) injection  40 mg Subcutaneous Q24H  . insulin aspart  0-6 Units Subcutaneous TID WC  . omega-3 acid ethyl esters  1 g Oral Daily   Continuous Infusions: . magnesium sulfate bolus IVPB    . remdesivir 100 mg in NS 100 mL     PRN Meds:.nitroGLYCERIN, [DISCONTINUED] ondansetron **OR** ondansetron (ZOFRAN) IV  Antibiotics  :    Anti-infectives (From admission, onward)   Start     Dose/Rate Route Frequency Ordered Stop   01/28/20 1000  remdesivir 100 mg in sodium chloride 0.9 % 100 mL IVPB       "  Followed by" Linked Group Details   100 mg 200 mL/hr over 30 Minutes Intravenous Daily 01/27/20 1249 02/01/20 0959   01/27/20 1330  remdesivir 200 mg in sodium chloride 0.9% 250 mL IVPB       "Followed by" Linked Group Details   200 mg 580 mL/hr over 30 Minutes Intravenous Once 01/27/20 1249 01/27/20 1406       Time Spent in minutes  30   Susa Raring M.D on 01/28/2020 at 9:11 AM  To page go to [email or web address] - password [facility]  [facility] -  Office  [phone]   See all Orders from today for further details    Objective:   Vitals:   01/28/20 0005 01/28/20 0300 01/28/20 0600 01/28/20 0800  BP: 125/80 120/77 126/77 129/83  Pulse: 61 93 (!) 49 63  Resp: (!) 22 20 (!) 23 19  Temp:  98.7 F (37.1 C)    TempSrc:  Oral    SpO2: 91% 92% 92% 93%  Weight:      Height:        Wt Readings from Last 3 Encounters:  01/26/20 97.5 kg  08/09/19 125.7 kg  07/22/18 118.8 kg     Intake/Output Summary (Last 24 hours) at 01/28/2020 0911 Last data filed at 01/28/2020 0310 Gross per 24 hour  Intake 1223.92 ml  Output 500 ml  Net 723.92 ml     Physical Exam  Awake Alert, No new F.N deficits, Normal affect [place].AT,PERRAL Supple Neck,No JVD, No cervical lymphadenopathy appriciated.  Symmetrical Chest wall movement, Good air movement bilaterally, CTAB RRR,No  Gallops, Rubs or new Murmurs, No Parasternal Heave +ve B.Sounds, Abd Soft, No tenderness, No organomegaly appriciated, No rebound - guarding or rigidity. No Cyanosis, Clubbing or edema, No new Rash or bruise    Data Review:    CBC Recent Labs  Lab 01/26/20 1519 01/27/20 0519 01/28/20 0500  WBC 7.0 6.3 5.0  HGB 14.4 13.7 13.7  HCT 43.1 40.2 40.1  PLT 148* PLATELET CLUMPS NOTED ON SMEAR, COUNT APPEARS ADEQUATE 160  MCV 97.3 96.9 98.0  MCH 32.5 33.0 33.5  MCHC 33.4 34.1 34.2  RDW 11.7 11.7 11.7  LYMPHSABS  --  1.0 0.8  MONOABS  --  0.3 0.3  EOSABS  --  0.0 0.0  BASOSABS  --  0.0 0.0    Chemistries  Recent Labs  Lab 01/26/20 1519 01/26/20 1547 01/27/20 0519 01/28/20 0500  NA 137  --  136 140  K 4.1  --  3.3* 3.9  CL 99  --  100 106  CO2 26  --  24 24  GLUCOSE 163*  --  137* 182*  BUN 34*  --  36* 34*  CREATININE 2.61*  --  2.22* 1.44*  CALCIUM 9.4  --  8.7* 8.6*  AST  --  40 37 38  ALT  --  18 20 19   ALKPHOS  --  46 44 47  BILITOT  --  1.0 0.9 0.7  MG  --   --   --  1.6*     ------------------------------------------------------------------------------------------------------------------ Recent Labs    01/26/20 2028  TRIG 92    Lab Results  Component Value Date   HGBA1C 6.6 (H) 07/22/2018   ------------------------------------------------------------------------------------------------------------------ No results for input(s): TSH, T4TOTAL, T3FREE, THYROIDAB in the last 72 hours.  Invalid input(s): FREET3  Cardiac Enzymes No results for input(s): CKMB, TROPONINI, MYOGLOBIN in the last 168 hours.  Invalid input(s): CK ------------------------------------------------------------------------------------------------------------------    Component Value Date/Time  BNP 838.5 (H) 01/28/2020 0500    Micro Results Recent Results (from the past 240 hour(s))  SARS Coronavirus 2 by RT PCR (hospital order, performed in [facility] hospital lab)  Nasopharyngeal Nasopharyngeal Swab     Status: Abnormal   Collection Time: 01/26/20  3:28 PM   Specimen: Nasopharyngeal Swab  Result Value Ref Range Status   SARS Coronavirus 2 POSITIVE (A) NEGATIVE Final    Comment: RESULT CALLED TO, READ BACK BY AND VERIFIED WITH: S,BERTRAND @1635  01/26/20 EB (NOTE) SARS-CoV-2 target nucleic acids are DETECTED  SARS-CoV-2 RNA is generally detectable in upper respiratory specimens  during the acute phase of infection.  Positive results are indicative  of the presence of the identified virus, but do not rule out bacterial infection or co-infection with other pathogens not detected by the test.  Clinical correlation with patient history and  other diagnostic information is necessary to determine patient infection status.  The expected result is negative.  Fact Sheet for Patients:   01/28/20   Fact Sheet for Healthcare Providers:   BoilerBrush.com.cy    This test is not yet approved or cleared by the https://pope.com/ FDA and  has been authorized for detection and/or diagnosis of SARS-CoV-2 by FDA under an Emergency Use Authorization (EUA).  This EUA will remain in effect (meaning this test can  be used) for the duration of  the COVID-19 declaration under Section 564(b)(1) of the Act, 21 U.S.C. section 360-bbb-3(b)(1), unless the authorization is terminated or revoked sooner.  Performed at [facility], [address], [place], [place]   Blood Culture (routine x 2)     Status: None (Preliminary result)   Collection Time: 01/26/20 10:40 PM   Specimen: BLOOD RIGHT HAND  Result Value Ref Range Status   Specimen Description BLOOD RIGHT HAND  Final   Special Requests   Final    BOTTLES DRAWN AEROBIC AND ANAEROBIC Blood Culture adequate volume   Culture   Final    NO GROWTH 2 DAYS Performed at [facility], [address], [place], [place]    Report Status PENDING   Incomplete  Blood Culture (routine x 2)     Status: None (Preliminary result)   Collection Time: 01/26/20 10:43 PM   Specimen: BLOOD LEFT HAND  Result Value Ref Range Status   Specimen Description BLOOD LEFT HAND  Final   Special Requests   Final    BOTTLES DRAWN AEROBIC AND ANAEROBIC Blood Culture adequate volume   Culture   Final    NO GROWTH 2 DAYS Performed at [facility], [address], [place], [place]    Report Status PENDING  Incomplete    Radiology Reports DG Chest Port 1 View  Result Date: 01/26/2020 CLINICAL DATA:  COVID-19 positive, shortness of breath, weakness EXAM: PORTABLE CHEST 1 VIEW COMPARISON:  02/13/2017 FINDINGS: Single frontal view of the chest demonstrates stable single lead pacer. Cardiac silhouette is unremarkable. Bibasilar interstitial prominence is noted, with superimposed patchy airspace disease at the lung bases. No effusion or pneumothorax. IMPRESSION: 1. Bibasilar ground-glass airspace disease superimposed upon interstitial prominence, consistent with multifocal COVID-19 pneumonia. Electronically Signed   By: 04/15/2017 M.D.   On: 01/26/2020 20:58

## 2020-01-28 NOTE — ED Notes (Signed)
Pt sleeping soundly and no distress noted. Will continue to monitor.

## 2020-01-29 LAB — COMPREHENSIVE METABOLIC PANEL
ALT: 23 U/L (ref 0–44)
AST: 40 U/L (ref 15–41)
Albumin: 2.9 g/dL — ABNORMAL LOW (ref 3.5–5.0)
Alkaline Phosphatase: 42 U/L (ref 38–126)
Anion gap: 12 (ref 5–15)
BUN: 32 mg/dL — ABNORMAL HIGH (ref 8–23)
CO2: 21 mmol/L — ABNORMAL LOW (ref 22–32)
Calcium: 8.6 mg/dL — ABNORMAL LOW (ref 8.9–10.3)
Chloride: 108 mmol/L (ref 98–111)
Creatinine, Ser: 1.25 mg/dL — ABNORMAL HIGH (ref 0.61–1.24)
GFR calc Af Amer: >60 mL/min (ref >60)
GFR calc non Af Amer: 57 mL/min — ABNORMAL LOW (ref >60)
Glucose, Bld: 185 mg/dL — ABNORMAL HIGH (ref 70–99)
Potassium: 3.7 mmol/L (ref 3.5–5.1)
Sodium: 141 mmol/L (ref 135–145)
Total Bilirubin: 0.9 mg/dL (ref 0.3–1.2)
Total Protein: 6.1 g/dL — ABNORMAL LOW (ref 6.5–8.1)

## 2020-01-29 LAB — CBC WITH DIFFERENTIAL/PLATELET
Abs Immature Granulocytes: 0.09 10*3/uL — ABNORMAL HIGH (ref 0.00–0.07)
Basophils Absolute: 0 10*3/uL (ref 0.0–0.1)
Basophils Relative: 0 %
Eosinophils Absolute: 0 10*3/uL (ref 0.0–0.5)
Eosinophils Relative: 0 %
HCT: 38.7 % — ABNORMAL LOW (ref 39.0–52.0)
Hemoglobin: 13.4 g/dL (ref 13.0–17.0)
Immature Granulocytes: 1 %
Lymphocytes Relative: 10 %
Lymphs Abs: 1 10*3/uL (ref 0.7–4.0)
MCH: 33 pg (ref 26.0–34.0)
MCHC: 34.6 g/dL (ref 30.0–36.0)
MCV: 95.3 fL (ref 80.0–100.0)
Monocytes Absolute: 0.5 10*3/uL (ref 0.1–1.0)
Monocytes Relative: 5 %
Neutro Abs: 8.1 10*3/uL — ABNORMAL HIGH (ref 1.7–7.7)
Neutrophils Relative %: 84 %
Platelets: 188 10*3/uL (ref 150–400)
RBC: 4.06 MIL/uL — ABNORMAL LOW (ref 4.22–5.81)
RDW: 11.6 % (ref 11.5–15.5)
WBC: 9.7 10*3/uL (ref 4.0–10.5)
nRBC: 0 % (ref 0.0–0.2)

## 2020-01-29 LAB — GLUCOSE, CAPILLARY
Glucose-Capillary: 172 mg/dL — ABNORMAL HIGH (ref 70–99)
Glucose-Capillary: 168 mg/dL — ABNORMAL HIGH (ref 70–99)
Glucose-Capillary: 209 mg/dL — ABNORMAL HIGH (ref 70–99)
Glucose-Capillary: 268 mg/dL — ABNORMAL HIGH (ref 70–99)

## 2020-01-29 LAB — BRAIN NATRIURETIC PEPTIDE: B Natriuretic Peptide: 1417.1 pg/mL — ABNORMAL HIGH (ref 0.0–100.0)

## 2020-01-29 LAB — D-DIMER, QUANTITATIVE: D-Dimer, Quant: 0.74 ug/mL-FEU — ABNORMAL HIGH (ref 0.00–0.50)

## 2020-01-29 LAB — MAGNESIUM: Magnesium: 1.5 mg/dL — ABNORMAL LOW (ref 1.7–2.4)

## 2020-01-29 LAB — C-REACTIVE PROTEIN: CRP: 1.7 mg/dL — ABNORMAL HIGH (ref <1.0)

## 2020-01-29 MED ORDER — MAGNESIUM SULFATE 2 GM/50ML IV SOLN
2.0000 g | Freq: Once | INTRAVENOUS | Status: AC
  Administered 2020-01-29: 2 g via INTRAVENOUS
  Filled 2020-01-29: qty 50

## 2020-01-29 MED ORDER — DEXAMETHASONE SODIUM PHOSPHATE 4 MG/ML IJ SOLN
2.0000 mg | Freq: Every day | INTRAMUSCULAR | Status: DC
  Administered 2020-01-30 – 2020-01-31 (×2): 2 mg via INTRAVENOUS
  Filled 2020-01-29 (×2): qty 1

## 2020-01-29 MED ORDER — AMLODIPINE BESYLATE 10 MG PO TABS
10.0000 mg | ORAL_TABLET | Freq: Every day | ORAL | Status: DC
  Administered 2020-01-29 – 2020-01-31 (×3): 10 mg via ORAL
  Filled 2020-01-29 (×3): qty 1

## 2020-01-29 NOTE — Evaluation (Signed)
Occupational Therapy Evaluation Patient Details Name: Kevin Patel MRN: [id number] DOB: 1945/11/09 Today's Date: 01/29/2020    History of Present Illness 75 yo male presenting with weakness, fatigue, and poor appetite. Tested COVID positive. PMH including ischemic cardiomyopathy last EF measured was 30 to 35% status post ICD, CAD s/p stenting, diabetes mellitus,  hypertension, and hyperlipidemia.   Clinical Impression   PTA, pt was living with his wife and was independent. Pt currently performing ADLs and functional mobility with Supervision-Min Guard level. Pt presenting with decreased activity tolerance compared to baseline. Use of RW for mobility in hallway for fatigue. SpO2 >88% on RA. Pt would benefit from further acute OT to facilitate safe dc. Recommend dc to home once medically stable per physician.   Follow Up Recommendations  No OT follow up;Supervision - Intermittent    Equipment Recommendations  None recommended by OT    Recommendations for Other Services PT consult     Precautions / Restrictions        Mobility Bed Mobility               General bed mobility comments: In recliner upon arrival  Transfers Overall transfer level: Needs assistance Equipment used: Rolling walker (2 wheeled) Transfers: Sit to/from Stand Sit to Stand: Supervision         General transfer comment: Supervision for safety    Balance Overall balance assessment: No apparent balance deficits (not formally assessed) (Use of RW for EC)                                         ADL either performed or assessed with clinical judgement   ADL Overall ADL's : Needs assistance/impaired Eating/Feeding: Set up;Sitting   Grooming: Set up;Supervision/safety;Sitting   Upper Body Bathing: Supervision/ safety;Set up;Sitting   Lower Body Bathing: Supervison/ safety;Set up;Sit to/from stand   Upper Body Dressing : Supervision/safety;Set up;Sitting   Lower Body  Dressing: Supervision/safety;Set up;Sit to/from stand   Toilet Transfer: Supervision/safety;Set up;Ambulation;RW (simulated to recliner)           Functional mobility during ADLs: Min guard;Rolling walker General ADL Comments: Pt performing at [employer] A level. Provided EC hadnout and will need to review     Vision         Perception     Praxis      Pertinent Vitals/Pain Pain Assessment: No/denies pain     Hand Dominance Right   Extremity/Trunk Assessment Upper Extremity Assessment Upper Extremity Assessment: Overall WFL for tasks assessed   Lower Extremity Assessment Lower Extremity Assessment: Defer to PT evaluation   Cervical / Trunk Assessment Cervical / Trunk Assessment: Normal   Communication Communication Communication: No difficulties   Cognition Arousal/Alertness: Awake/alert Behavior During Therapy: WFL for tasks assessed/performed Overall Cognitive Status: Within Functional Limits for tasks assessed                                 General Comments: Very motivated   General Comments  RR 20s, HR 80s, SpO2 >88% on RA    Exercises     Shoulder Instructions      Home Living Family/patient expects to be discharged to:: Private residence Living Arrangements: Spouse/significant other Available Help at Discharge: Family;Available 24 hours/day Type of Home: House Home Access: Stairs to enter [employer] of Steps: 1  Home Layout: One level     Bathroom Shower/Tub: Producer, television/film/video: Standard     Home Equipment: Shower seat - built in;Walker - 2 wheels          Prior Functioning/Environment Level of Independence: Independent        Comments: ADLs, IADLs, and driving        OT Problem List: Decreased strength;Decreased activity tolerance;Decreased knowledge of use of DME or AE;Decreased knowledge of precautions      OT Treatment/Interventions: Self-care/ADL training;Therapeutic  exercise;Energy conservation;DME and/or AE instruction;Therapeutic activities;Patient/family education    OT Goals(Current goals can be found in the care plan section) Acute Rehab OT Goals Patient Stated Goal: Go home to my wife OT Goal Formulation: With patient Time For Goal Achievement: 02/12/20 Potential to Achieve Goals: Good  OT Frequency: Min 2X/week   Barriers to D/C:            Co-evaluation              AM-PAC OT "6 Clicks" Daily Activity     Outcome Measure Help from another person eating meals?: None Help from another person taking care of personal grooming?: A Little Help from another person toileting, which includes using toliet, bedpan, or urinal?: A Little Help from another person bathing (including washing, rinsing, drying)?: A Little Help from another person to put on and taking off regular upper body clothing?: A Little Help from another person to put on and taking off regular lower body clothing?: A Little 6 Click Score: 19   End of Session Equipment Utilized During Treatment: Rolling walker Nurse Communication: Mobility status  Activity Tolerance: Patient tolerated treatment well Patient left: in chair;with call bell/phone within reach  OT Visit Diagnosis: Unsteadiness on feet (R26.81);Other abnormalities of gait and mobility (R26.89);Muscle weakness (generalized) (M62.81)                Time: 2956-2130 OT Time Calculation (min): 22 min Charges:  OT General Charges $OT Visit: 1 Visit OT Evaluation $OT Eval Moderate Complexity: 1 Mod  Kevin Patel MSOT, OTR/L Acute Rehab Pager: [phone] Office: [phone]  Kevin Patel 01/29/2020, 4:37 PM

## 2020-01-29 NOTE — Progress Notes (Signed)
Kevin Patel [id number] Admission Data: 01/29/2020 2:45 AM Attending Provider: Leroy Sea, MD  NWG:[id number], Evlyn Courier., MD Consults/ Treatment Team:   Kevin Patel is a 74 y.o. male patient admitted from ED awake, alert  & orientated  X 4,  Full Code, VSS - Blood pressure (!) 149/101, pulse 80, temperature 97.8 F (36.6 C), temperature source Axillary, resp. rate 20, height 6\' 4"  (1.93 m), weight 97.5 kg, SpO2 93 %., Room Air, no c/o shortness of breath, no c/o chest pain, no distress noted. Tele # [id number] placed and pt is currently running: NSR  IV site WDL:  RAC with a transparent dsg that's clean dry and intact.  Allergies:  No Known Allergies   Past Medical History:  Diagnosis Date  . AICD (automatic cardioverter/defibrillator) present 02/12/2017  . Chronic systolic HF (heart failure) (HCC)   . Coronary artery disease   . Diabetes mellitus with coincident hypertension (HCC) 09/09/2016  . History of kidney stones   . HTN (hypertension)   . Hyperlipidemia   . Ischemic cardiomyopathy   . MI (myocardial infarction) (HCC) 1998   myoview 09/25/2006-no ischemia, EF 46%  . Mural thrombus of heart    on coumadin until follow up echo with contrast in Sept 13, 2012 which showed no thrombus and coumadin was d/c    History:  obtained from patient Tobacco/alcohol: former  Pt orientation to unit, room and routine. Information packet given to patient/family and safety video watched.  Admission INP armband ID verified with patient/family, and in place. SR up x 2, fall risk assessment complete with Patient and family verbalizing understanding of risks associated with falls. Pt verbalizes an understanding of how to use the call bell and to call for help before getting out of bed.  Skin, clean-dry- intact without evidence of bruising, or skin tears.   No evidence of skin break down noted on exam.   Will cont to monitor and assist as needed.  04-02-1993, RN 01/29/2020 2:45 AM

## 2020-01-29 NOTE — Progress Notes (Signed)
PT Cancellation Note  Patient Details Name: Kevin Patel MRN: [id number] DOB: 1946/01/24   Cancelled Treatment:    Reason Eval/Treat Not Completed: Patient at procedure or test/unavailable  Patient just began eating his lunch. Will attempt to see this pm as schedule permits.    Jerolyn Center, PT Pager [phone]  Zena Amos 01/29/2020, 1:07 PM

## 2020-01-29 NOTE — Progress Notes (Signed)
PROGRESS NOTE                                                                                                                                                                                                             Patient Demographics:    Kevin Patel, is a 74 y.o. male, DOB - [birth year], NWG:[id number]  Outpatient Primary MD for the patient is Kevin Payment., MD    LOS - 2  Admit date - 01/26/2020    Chief Complaint  Patient presents with  . Weakness  . Illness       Brief Narrative  - Kevin Patel is a 74 y.o. male with history of ischemic cardiomyopathy last EF measured was 30 to 35% status post ICD, CAD status post stenting diabetes mellitus hypertension hyperlipidemia presents to the ER because of increasing weakness fatigue and poor appetite, was diagnosed with COVID-19 pneumonia and admitted to the hospital.  Unfortunately he is not vaccinated.   Subjective:   Patient in bed, appears comfortable, denies any headache, no fever, no chest pain or pressure, no shortness of breath , no abdominal pain. No focal weakness.   Assessment  & Plan :     1. Acute Hypoxic Resp. Failure due to Acute Covid 19 Viral Pneumonitis during the ongoing 2020 Covid 19 Pandemic - he is to have moderate disease, is now developing shortness of breath, due to his severe underlying comorbidities we have low margin of error and I will start him on low-dose steroids along with remdesivir and monitor closely.  Encouraged the patient to sit up in chair in the daytime use I-S and flutter valve for pulmonary toiletry and then prone in bed when at night.  Will advance activity and titrate down oxygen as possible.    SpO2: 93 %  Recent Labs  Lab 01/26/20 1519 01/26/20 1528 01/26/20 1547 01/26/20 2028 01/26/20 2228 01/27/20 0519 01/28/20 0500 01/29/20 0749  WBC 7.0  --   --   --   --  6.3 5.0  --   PLT 148*  --   --   --   --  PLATELET  CLUMPS NOTED ON SMEAR, COUNT APPEARS ADEQUATE 160  --   CRP  --   --   --  3.8*  --  4.0* 4.0* 1.7*  AST  --   --  40  --   --  37 38 40  ALT  --   --  18  --   --  20 19 23   ALKPHOS  --   --  46  --   --  44 47 42  BILITOT  --   --  1.0  --   --  0.9 0.7 0.9  ALBUMIN  --   --  3.4*  --   --  3.2* 2.9* 2.9*  DDIMER  --   --   --  1.11*  --  1.13* 0.84* 0.74*  PROCALCITON  --   --   --  <0.10  --   --   --   --   LATICACIDVEN  --   --   --   --  2.4*  --   --   --   SARSCOV2NAA  --  POSITIVE*  --   --   --   --   --   --     2.  History of chronic ischemic cardiomyopathy with chronic systolic heart failure EF 35% s/p AICD placement.  Kevin Patel appears compensated will continue combination of aspirin, Coreg and statin for secondary prevention along with monitoring.  3.  CAD.  As above.  4.  Mild COVID-19 viral infection related thrombocytopenia, resolved.  5.  Hypomagnesemia.  Replaced again IV.  6.  AKI on CKD 3A.  Almost resolved after hydration and close to baseline.  7. HTN - placed on Norvasc.   Condition - Fair  Family Communication  : daughter - [phone] - on 01/27/20, 01/29/20 message left at 9:25 AM  Code Status :  Full  Consults  :  None  Procedures  :  None  PUD Prophylaxis :   Disposition Plan  :    Status is: Observation  The patient will require care spanning > 2 midnights and should be moved to inpatient because: IV treatments appropriate due to intensity of illness or inability to take PO  Dispo: The patient is from: Home              Anticipated d/c is to: Home              Anticipated d/c date is: > 3 days              Patient currently is not medically stable to d/c.  DVT Prophylaxis  :  Lovenox   Lab Results  Component Value Date   PLT 160 01/28/2020    Diet :  Diet Order            Diet heart healthy/carb modified Room service appropriate? Yes; Fluid consistency: Thin; Fluid restriction: 1200 mL Fluid  Diet effective now                    Inpatient Medications  Scheduled Meds: . aspirin EC  81 mg Oral Daily  . atorvastatin  80 mg Oral Daily  . carvedilol  12.5 mg Oral BID WC  . dexamethasone (DECADRON) injection  6 mg Intravenous Daily  . enoxaparin (LOVENOX) injection  40 mg Subcutaneous Q24H  . insulin aspart  0-6 Units Subcutaneous TID WC  . omega-3 acid ethyl esters  1 g Oral Daily   Continuous Infusions: . magnesium sulfate bolus IVPB    . remdesivir 100 mg in NS 100 mL 100 mg (01/29/20 0855)   PRN Meds:.nitroGLYCERIN, [DISCONTINUED] ondansetron **OR** ondansetron (ZOFRAN) IV  Antibiotics  :  Anti-infectives (From admission, onward)   Start     Dose/Rate Route Frequency Ordered Stop   01/28/20 1000  remdesivir 100 mg in sodium chloride 0.9 % 100 mL IVPB       "Followed by" Linked Group Details   100 mg 200 mL/hr over 30 Minutes Intravenous Daily 01/27/20 1249 02/01/20 0959   01/27/20 1330  remdesivir 200 mg in sodium chloride 0.9% 250 mL IVPB       "Followed by" Linked Group Details   200 mg 580 mL/hr over 30 Minutes Intravenous Once 01/27/20 1249 01/27/20 1406       Time Spent in minutes  30   Susa Raring M.D on 01/29/2020 at 9:24 AM  To page go to [email or web address] - password [email or web address]  [facility] -  Office  [phone]   See all Orders from today for further details    Objective:   Vitals:   01/28/20 2215 01/28/20 2230 01/28/20 2351 01/29/20 0000  BP:   (!) 149/101 (!) 150/86  Pulse: (!) 59 (!) 53 80 80  Resp: (!) 27 (!) 25 20 (!) 21  Temp:   97.8 F (36.6 C)   TempSrc:   Axillary   SpO2: 95% 93% 93% 93%  Weight:   97.6 kg   Height:   6\' 4"  (1.93 m)     Wt Readings from Last 3 Encounters:  01/28/20 97.6 kg  08/09/19 125.7 kg  07/22/18 118.8 kg     Intake/Output Summary (Last 24 hours) at 01/29/2020 0924 Last data filed at 01/29/2020 0747 Gross per 24 hour  Intake 1033.33 ml  Output 300 ml  Net 733.33 ml     Physical Exam  Awake Alert, No new F.N deficits,  Normal affect [place].AT,PERRAL Supple Neck,No JVD, No cervical lymphadenopathy appriciated.  Symmetrical Chest wall movement, Good air movement bilaterally, CTAB RRR,No Gallops, Rubs or new Murmurs, No Parasternal Heave +ve B.Sounds, Abd Soft, No tenderness, No organomegaly appriciated, No rebound - guarding or rigidity. No Cyanosis, Clubbing or edema, No new Rash or bruise    Data Review:    CBC Recent Labs  Lab 01/26/20 1519 01/27/20 0519 01/28/20 0500  WBC 7.0 6.3 5.0  HGB 14.4 13.7 13.7  HCT 43.1 40.2 40.1  PLT 148* PLATELET CLUMPS NOTED ON SMEAR, COUNT APPEARS ADEQUATE 160  MCV 97.3 96.9 98.0  MCH 32.5 33.0 33.5  MCHC 33.4 34.1 34.2  RDW 11.7 11.7 11.7  LYMPHSABS  --  1.0 0.8  MONOABS  --  0.3 0.3  EOSABS  --  0.0 0.0  BASOSABS  --  0.0 0.0    Chemistries  Recent Labs  Lab 01/26/20 1519 01/26/20 1547 01/27/20 0519 01/28/20 0500 01/29/20 0749  NA 137  --  136 140 141  K 4.1  --  3.3* 3.9 3.7  CL 99  --  100 106 108  CO2 26  --  24 24 21*  GLUCOSE 163*  --  137* 182* 185*  BUN 34*  --  36* 34* 32*  CREATININE 2.61*  --  2.22* 1.44* 1.25*  CALCIUM 9.4  --  8.7* 8.6* 8.6*  AST  --  40 37 38 40  ALT  --  18 20 19 23   ALKPHOS  --  46 44 47 42  BILITOT  --  1.0 0.9 0.7 0.9  MG  --   --   --  1.6* 1.5*     ------------------------------------------------------------------------------------------------------------------ Recent Labs    01/26/20 2028  TRIG 92  Lab Results  Component Value Date   HGBA1C 6.6 (H) 07/22/2018   ------------------------------------------------------------------------------------------------------------------ No results for input(s): TSH, T4TOTAL, T3FREE, THYROIDAB in the last 72 hours.  Invalid input(s): FREET3  Cardiac Enzymes No results for input(s): CKMB, TROPONINI, MYOGLOBIN in the last 168 hours.  Invalid input(s):  CK ------------------------------------------------------------------------------------------------------------------    Component Value Date/Time   BNP 1,417.1 (H) 01/29/2020 0749    Micro Results Recent Results (from the past 240 hour(s))  SARS Coronavirus 2 by RT PCR (hospital order, performed in [facility] hospital lab) Nasopharyngeal Nasopharyngeal Swab     Status: Abnormal   Collection Time: 01/26/20  3:28 PM   Specimen: Nasopharyngeal Swab  Result Value Ref Range Status   SARS Coronavirus 2 POSITIVE (A) NEGATIVE Final    Comment: RESULT CALLED TO, READ BACK BY AND VERIFIED WITH: S,BERTRAND @1635  01/26/20 EB (NOTE) SARS-CoV-2 target nucleic acids are DETECTED  SARS-CoV-2 RNA is generally detectable in upper respiratory specimens  during the acute phase of infection.  Positive results are indicative  of the presence of the identified virus, but do not rule out bacterial infection or co-infection with other pathogens not detected by the test.  Clinical correlation with patient history and  other diagnostic information is necessary to determine patient infection status.  The expected result is negative.  Fact Sheet for Patients:   01/28/20   Fact Sheet for Healthcare Providers:   BoilerBrush.com.cy    This test is not yet approved or cleared by the https://pope.com/ FDA and  has been authorized for detection and/or diagnosis of SARS-CoV-2 by FDA under an Emergency Use Authorization (EUA).  This EUA will remain in effect (meaning this test can  be used) for the duration of  the COVID-19 declaration under Section 564(b)(1) of the Act, 21 U.S.C. section 360-bbb-3(b)(1), unless the authorization is terminated or revoked sooner.  Performed at [facility], [address] [place], [place]   Blood Culture (routine x 2)     Status: None (Preliminary result)   Collection Time: 01/26/20 10:40 PM   Specimen:  BLOOD RIGHT HAND  Result Value Ref Range Status   Specimen Description BLOOD RIGHT HAND  Final   Special Requests   Final    BOTTLES DRAWN AEROBIC AND ANAEROBIC Blood Culture adequate volume   Culture   Final    NO GROWTH 2 DAYS Performed at [facility], [address], [place], [place]    Report Status PENDING  Incomplete  Blood Culture (routine x 2)     Status: None (Preliminary result)   Collection Time: 01/26/20 10:43 PM   Specimen: BLOOD LEFT HAND  Result Value Ref Range Status   Specimen Description BLOOD LEFT HAND  Final   Special Requests   Final    BOTTLES DRAWN AEROBIC AND ANAEROBIC Blood Culture adequate volume   Culture   Final    NO GROWTH 2 DAYS Performed at [facility], [address], [place], [place]    Report Status PENDING  Incomplete    Radiology Reports DG Chest Port 1 View  Result Date: 01/26/2020 CLINICAL DATA:  COVID-19 positive, shortness of breath, weakness EXAM: PORTABLE CHEST 1 VIEW COMPARISON:  02/13/2017 FINDINGS: Single frontal view of the chest demonstrates stable single lead pacer. Cardiac silhouette is unremarkable. Bibasilar interstitial prominence is noted, with superimposed patchy airspace disease at the lung bases. No effusion or pneumothorax. IMPRESSION: 1. Bibasilar ground-glass airspace disease superimposed upon interstitial prominence, consistent  with multifocal COVID-19 pneumonia. Electronically Signed   By: Sharlet Salina M.D.   On: 01/26/2020 20:58

## 2020-01-30 LAB — GLUCOSE, CAPILLARY
Glucose-Capillary: 200 mg/dL — ABNORMAL HIGH (ref 70–99)
Glucose-Capillary: 132 mg/dL — ABNORMAL HIGH (ref 70–99)
Glucose-Capillary: 186 mg/dL — ABNORMAL HIGH (ref 70–99)
Glucose-Capillary: 201 mg/dL — ABNORMAL HIGH (ref 70–99)
Glucose-Capillary: 178 mg/dL — ABNORMAL HIGH (ref 70–99)
Glucose-Capillary: 219 mg/dL — ABNORMAL HIGH (ref 70–99)
Glucose-Capillary: 272 mg/dL — ABNORMAL HIGH (ref 70–99)
Glucose-Capillary: 264 mg/dL — ABNORMAL HIGH (ref 70–99)

## 2020-01-30 LAB — MAGNESIUM: Magnesium: 1.6 mg/dL — ABNORMAL LOW (ref 1.7–2.4)

## 2020-01-30 MED ORDER — MAGNESIUM SULFATE 4 GM/100ML IV SOLN
4.0000 g | Freq: Once | INTRAVENOUS | Status: AC
  Administered 2020-01-30: 4 g via INTRAVENOUS
  Filled 2020-01-30: qty 100

## 2020-01-30 NOTE — Progress Notes (Signed)
PROGRESS NOTE                                                                                                                                                                                                             Patient Demographics:    Kevin Patel, is a 74 y.o. male, DOB - July 03, 1945, FTD:[id number]  Outpatient Primary MD for the patient is [name]., MD    LOS - 3  Admit date - 01/26/2020    Chief Complaint  Patient presents with  . Weakness  . Illness       Brief Narrative  - Kevin Patel is a 74 y.o. male with history of ischemic cardiomyopathy last EF measured was 30 to 35% status post ICD, CAD status post stenting diabetes mellitus hypertension hyperlipidemia presents to the ER because of increasing weakness fatigue and poor appetite, was diagnosed with COVID-19 pneumonia and admitted to the hospital.  Unfortunately he is not vaccinated.   Subjective:   Patient in bed, appears comfortable, denies any headache, no fever, no chest pain or pressure, no shortness of breath , no abdominal pain. No focal weakness.   Assessment  & Plan :     1. Acute Hypoxic Resp. Failure due to Acute Covid 19 Viral Pneumonitis during the ongoing 2020 Covid 19 Pandemic - he is to have moderate disease, is now developing shortness of breath, due to his severe underlying comorbidities we have low margin of error and I will start him on low-dose steroids along with remdesivir and monitor closely.  Encouraged the patient to sit up in chair in the daytime use I-S and flutter valve for pulmonary toiletry and then prone in bed when at night.  Will advance activity and titrate down oxygen as possible.    SpO2: 98 %  Recent Labs  Lab 01/26/20 1519 01/26/20 1528 01/26/20 1547 01/26/20 2028 01/26/20 2228 01/27/20 0519 01/28/20 0500 01/29/20 0749  WBC 7.0  --   --   --   --  6.3 5.0 9.7  PLT 148*  --   --   --   --  PLATELET  CLUMPS NOTED ON SMEAR, COUNT APPEARS ADEQUATE 160 188  CRP  --   --   --  3.8*  --  4.0* 4.0* 1.7*  AST  --   --  40  --   --  37 38 40  ALT  --   --  18  --   --  20 19 23   ALKPHOS  --   --  46  --   --  44 47 42  BILITOT  --   --  1.0  --   --  0.9 0.7 0.9  ALBUMIN  --   --  3.4*  --   --  3.2* 2.9* 2.9*  DDIMER  --   --   --  1.11*  --  1.13* 0.84* 0.74*  PROCALCITON  --   --   --  <0.10  --   --   --   --   LATICACIDVEN  --   --   --   --  2.4*  --   --   --   SARSCOV2NAA  --  POSITIVE*  --   --   --   --   --   --     2.  History of chronic ischemic cardiomyopathy with chronic systolic heart failure EF 35% s/p AICD placement.  [name] appears compensated will continue combination of aspirin, Coreg and statin for secondary prevention along with monitoring.  3.  CAD.  As above.  4.  Mild COVID-19 viral infection related thrombocytopenia, resolved.  5.  Hypomagnesemia.  Replaced again IV, recheck in am.  6.  AKI on CKD 3A.  Almost resolved after hydration and close to baseline.  7. HTN - placed on Norvasc.   Condition - Fair  Family Communication  : daughter - [phone] - on 01/27/20, 01/29/20 message left at 9:25 AM, updated again 01/30/2020.  Called daughter 02/01/2020 left message 01/30/2020 at 10:05 AM - [phone]    Code Status :  Full  Consults  :  None  Procedures  :  None  PUD Prophylaxis :   Disposition Plan  :    Status is: Observation  The patient will require care spanning > 2 midnights and should be moved to inpatient because: IV treatments appropriate due to intensity of illness or inability to take PO  Dispo: The patient is from: Home              Anticipated d/c is to: Home              Anticipated d/c date is: > 3 days              Patient currently is not medically stable to d/c.  DVT Prophylaxis  :  Lovenox   Lab Results  Component Value Date   PLT 188 01/29/2020    Diet :  Diet Order            Diet heart healthy/carb  modified Room service appropriate? Yes; Fluid consistency: Thin; Fluid restriction: 1200 mL Fluid  Diet effective now                  Inpatient Medications  Scheduled Meds: . amLODipine  10 mg Oral Daily  . aspirin EC  81 mg Oral Daily  . atorvastatin  80 mg Oral Daily  . carvedilol  12.5 mg Oral BID WC  . dexamethasone (DECADRON) injection  2 mg Intravenous Daily  . enoxaparin (LOVENOX) injection  40 mg Subcutaneous Q24H  . insulin aspart  0-6 Units Subcutaneous TID WC  . omega-3 acid ethyl esters  1 g Oral Daily   Continuous Infusions: . magnesium sulfate bolus IVPB    . remdesivir 100 mg in NS 100 mL  100 mg (01/30/20 0841)   PRN Meds:.nitroGLYCERIN, [DISCONTINUED] ondansetron **OR** ondansetron (ZOFRAN) IV  Antibiotics  :    Anti-infectives (From admission, onward)   Start     Dose/Rate Route Frequency Ordered Stop   01/28/20 1000  remdesivir 100 mg in sodium chloride 0.9 % 100 mL IVPB       "Followed by" Linked Group Details   100 mg 200 mL/hr over 30 Minutes Intravenous Daily 01/27/20 1249 02/01/20 0959   01/27/20 1330  remdesivir 200 mg in sodium chloride 0.9% 250 mL IVPB       "Followed by" Linked Group Details   200 mg 580 mL/hr over 30 Minutes Intravenous Once 01/27/20 1249 01/27/20 1406       Time Spent in minutes  30   Susa Raring M.D on 01/30/2020 at 10:05 AM  To page go to [email or web address] - password [email or web address]  [facility] -  Office  [phone]   See all Orders from today for further details    Objective:   Vitals:   01/29/20 1345 01/29/20 2132 01/30/20 0537 01/30/20 0837  BP: 110/70 126/87 129/82 134/88  Pulse: (!) 57 65 60 (!) 57  Resp: 18 17 20 20   Temp: 97.6 F (36.4 C) 97.7 F (36.5 C) 97.7 F (36.5 C)   TempSrc: Oral Oral Oral   SpO2: 94% 98% 91% 98%  Weight:      Height:        Wt Readings from Last 3 Encounters:  01/28/20 97.6 kg  08/09/19 125.7 kg  07/22/18 118.8 kg     Intake/Output Summary (Last 24 hours) at  01/30/2020 1005 Last data filed at 01/30/2020 0841 Gross per 24 hour  Intake 480 ml  Output 750 ml  Net -270 ml     Physical Exam  Awake Alert, No new F.N deficits, Normal affect [place].AT,PERRAL Supple Neck,No JVD, No cervical lymphadenopathy appriciated.  Symmetrical Chest wall movement, Good air movement bilaterally, CTAB RRR,No Gallops, Rubs or new Murmurs, No Parasternal Heave +ve B.Sounds, Abd Soft, No tenderness, No organomegaly appriciated, No rebound - guarding or rigidity. No Cyanosis, Clubbing or edema, No new Rash or bruise    Data Review:    CBC Recent Labs  Lab 01/26/20 1519 01/27/20 0519 01/28/20 0500 01/29/20 0749  WBC 7.0 6.3 5.0 9.7  HGB 14.4 13.7 13.7 13.4  HCT 43.1 40.2 40.1 38.7*  PLT 148* PLATELET CLUMPS NOTED ON SMEAR, COUNT APPEARS ADEQUATE 160 188  MCV 97.3 96.9 98.0 95.3  MCH 32.5 33.0 33.5 33.0  MCHC 33.4 34.1 34.2 34.6  RDW 11.7 11.7 11.7 11.6  LYMPHSABS  --  1.0 0.8 1.0  MONOABS  --  0.3 0.3 0.5  EOSABS  --  0.0 0.0 0.0  BASOSABS  --  0.0 0.0 0.0    Chemistries  Recent Labs  Lab 01/26/20 1519 01/26/20 1547 01/27/20 0519 01/28/20 0500 01/29/20 0749 01/30/20 0359  NA 137  --  136 140 141  --   K 4.1  --  3.3* 3.9 3.7  --   CL 99  --  100 106 108  --   CO2 26  --  24 24 21*  --   GLUCOSE 163*  --  137* 182* 185*  --   BUN 34*  --  36* 34* 32*  --   CREATININE 2.61*  --  2.22* 1.44* 1.25*  --   CALCIUM 9.4  --  8.7* 8.6* 8.6*  --   AST  --  40 37  38 40  --   ALT  --  --   ALKPHOS  --  46 44 47 42  --   BILITOT  --  1.0 0.9 0.7 0.9  --   MG  --   --   --  1.6* 1.5* 1.6*     ------------------------------------------------------------------------------------------------------------------ No results for input(s): CHOL, HDL, LDLCALC, TRIG, CHOLHDL, LDLDIRECT in the last 72 hours.  Lab Results  Component Value Date   HGBA1C 6.6 (H) 07/22/2018    ------------------------------------------------------------------------------------------------------------------ No results for input(s): TSH, T4TOTAL, T3FREE, THYROIDAB in the last 72 hours.  Invalid input(s): FREET3  Cardiac Enzymes No results for input(s): CKMB, TROPONINI, MYOGLOBIN in the last 168 hours.  Invalid input(s): CK ------------------------------------------------------------------------------------------------------------------    Component Value Date/Time   BNP 1,417.1 (H) 01/29/2020 0749    Micro Results Recent Results (from the past 240 hour(s))  SARS Coronavirus 2 by RT PCR (hospital order, performed in [facility] hospital lab) Nasopharyngeal Nasopharyngeal Swab     Status: Abnormal   Collection Time: 01/26/20  3:28 PM   Specimen: Nasopharyngeal Swab  Result Value Ref Range Status   SARS Coronavirus 2 POSITIVE (A) NEGATIVE Final    Comment: RESULT CALLED TO, READ BACK BY AND VERIFIED WITH: S,BERTRAND  01/26/20 EB (NOTE) SARS-CoV-2 target nucleic acids are DETECTED  SARS-CoV-2 RNA is generally detectable in upper respiratory specimens  during the acute phase of infection.  Positive results are indicative  of the presence of the identified virus, but do not rule out bacterial infection or co-infection with other pathogens not detected by the test.  Clinical correlation with patient history and  other diagnostic information is necessary to determine patient infection status.  The expected result is negative.  Fact Sheet for Patients:   BoilerBrush.com.cy   Fact Sheet for Healthcare Providers:   https://pope.com/    This test is not yet approved or cleared by the [place] FDA and  has been authorized for detection and/or diagnosis of SARS-CoV-2 by FDA under an Emergency Use Authorization (EUA).  This EUA will remain in effect (meaning this test can  be used) for the duration of  the COVID-19  declaration under Section 564(b)(1) of the Act, 21 U.S.C. section 360-bbb-3(b)(1), unless the authorization is terminated or revoked sooner.  Performed at [facility], [address], [place], [place] [address]   Blood Culture (routine x 2)     Status: None (Preliminary result)   Collection Time: 01/26/20 10:40 PM   Specimen: BLOOD RIGHT HAND  Result Value Ref Range Status   Specimen Description BLOOD RIGHT HAND  Final   Special Requests   Final    BOTTLES DRAWN AEROBIC AND ANAEROBIC Blood Culture adequate volume   Culture   Final    NO GROWTH 4 DAYS Performed at [facility], [address] [place], [place] [address]    Report Status PENDING  Incomplete  Blood Culture (routine x 2)     Status: None (Preliminary result)   Collection Time: 01/26/20 10:43 PM   Specimen: BLOOD LEFT HAND  Result Value Ref Range Status   Specimen Description BLOOD LEFT HAND  Final   Special Requests   Final    BOTTLES DRAWN AEROBIC AND ANAEROBIC Blood Culture adequate volume   Culture   Final    NO GROWTH 4 DAYS Performed at [facility], [address], [place], [place] [address]    Report Status PENDING  Incomplete  Radiology Reports DG Chest Port 1 View  Result Date: 01/26/2020 CLINICAL DATA:  COVID-19 positive, shortness of breath, weakness EXAM: PORTABLE CHEST 1 VIEW COMPARISON:  02/13/2017 FINDINGS: Single frontal view of the chest demonstrates stable single lead pacer. Cardiac silhouette is unremarkable. Bibasilar interstitial prominence is noted, with superimposed patchy airspace disease at the lung bases. No effusion or pneumothorax. IMPRESSION: 1. Bibasilar ground-glass airspace disease superimposed upon interstitial prominence, consistent with multifocal COVID-19 pneumonia. Electronically Signed   By: Sharlet Salina M.D.   On: 01/26/2020 20:58

## 2020-01-30 NOTE — Evaluation (Signed)
Physical Therapy Evaluation Patient Details Name: Kevin Patel MRN: [id number] DOB: 1946-04-09 Today's Date: 01/30/2020   History of Present Illness  74 yo male presenting with weakness, fatigue, and poor appetite. Tested COVID positive. PMH including ischemic cardiomyopathy last EF measured was 30 to 35% status post ICD, CAD s/p stenting, diabetes mellitus,  hypertension, and hyperlipidemia.  Clinical Impression   Pt admitted with above diagnosis. PTA, pt was independent with mobility. He currently requires min-guard assist with walking and pushing IV pole. May benefit from cane vs RW. Sats 88-90% on room air; HR 76 to 97 bpm. Pt currently with functional limitations due to the deficits listed below (see PT Problem List). Pt will benefit from skilled PT to increase their independence and safety with mobility to allow discharge to the venue listed below.       Follow Up Recommendations Home health PT (home safety evaluation)    Equipment Recommendations  None recommended by PT    Recommendations for Other Services       Precautions / Restrictions Precautions Precautions: Fall;Other (comment) Precaution Comments: monitor sats      Mobility  Bed Mobility               General bed mobility comments: In recliner upon arrival  Transfers Overall transfer level: Needs assistance Equipment used: None Transfers: Sit to/from Stand Sit to Stand: Supervision         General transfer comment: Supervision for safety  Ambulation/Gait Ambulation/Gait assistance: Min guard Gait Distance (Feet): 160 Feet Assistive device: IV Pole Gait Pattern/deviations: Step-through pattern;Decreased stride length;Wide base of support Gait velocity: slow   General Gait Details: appropriate velocity for his condition  Stairs            Wheelchair Mobility    Modified Rankin (Stroke Patients Only)       Balance Overall balance assessment: Mild deficits observed, not formally  tested                                           Pertinent Vitals/Pain Pain Assessment: No/denies pain    Home Living Family/patient expects to be discharged to:: Private residence Living Arrangements: Spouse/significant other Available Help at Discharge: Family;Available 24 hours/day Type of Home: House Home Access: Stairs to enter   [employer] of Steps: 1 Home Layout: One level Home Equipment: Shower seat - built in;Walker - 2 wheels      Prior Function Level of Independence: Independent         Comments: ADLs, IADLs, and driving     Hand Dominance   Dominant Hand: Right    Extremity/Trunk Assessment   Upper Extremity Assessment Upper Extremity Assessment: Defer to OT evaluation    Lower Extremity Assessment Lower Extremity Assessment: Overall WFL for tasks assessed    Cervical / Trunk Assessment Cervical / Trunk Assessment: Normal  Communication   Communication: No difficulties  Cognition Arousal/Alertness: Awake/alert Behavior During Therapy: WFL for tasks assessed/performed Overall Cognitive Status: Within Functional Limits for tasks assessed                                 General Comments: Very motivated      General Comments General comments (skin integrity, edema, etc.): Pt felt unsteady and did not want RW; agreed to use IV pole and he was  able to self-correct his imbalance    Exercises Other Exercises Other Exercises: pt demonstrated proper use of IS up to 2000 ml   Assessment/Plan    PT Assessment Patient needs continued PT services  PT Problem List Decreased activity tolerance;Decreased balance;Decreased mobility;Decreased knowledge of use of DME;Cardiopulmonary status limiting activity       PT Treatment Interventions DME instruction;Gait training;Functional mobility training;Therapeutic activities;Therapeutic exercise;Balance training;Patient/family education    PT Goals (Current goals can  be found in the Care Plan section)  Acute Rehab PT Goals Patient Stated Goal: Go home to my wife PT Goal Formulation: With patient Time For Goal Achievement: 02/13/20 Potential to Achieve Goals: Good    Frequency Min 3X/week   Barriers to discharge        Co-evaluation               AM-PAC PT "6 Clicks" Mobility  Outcome Measure Help needed turning from your back to your side while in a flat bed without using bedrails?: None Help needed moving from lying on your back to sitting on the side of a flat bed without using bedrails?: A Little Help needed moving to and from a bed to a chair (including a wheelchair)?: A Little Help needed standing up from a chair using your arms (e.g., wheelchair or bedside chair)?: A Little Help needed to walk in hospital room?: A Little Help needed climbing 3-5 steps with a railing? : A Little 6 Click Score: 19    End of Session   Activity Tolerance: Patient tolerated treatment well Patient left: in chair;with call bell/phone within reach Nurse Communication: Mobility status PT Visit Diagnosis: Unsteadiness on feet (R26.81)    Time: 1308-6578 PT Time Calculation (min) (ACUTE ONLY): 22 min   Charges:   PT Evaluation $PT Eval Moderate Complexity: 1 Mod           Jerolyn Center, PT Pager [phone]   Zena Amos 01/30/2020, 4:51 PM

## 2020-01-31 LAB — MAGNESIUM: Magnesium: 1.6 mg/dL — ABNORMAL LOW (ref 1.7–2.4)

## 2020-01-31 LAB — CULTURE, BLOOD (ROUTINE X 2)
Culture: NO GROWTH
Culture: NO GROWTH
Special Requests: ADEQUATE
Special Requests: ADEQUATE

## 2020-01-31 LAB — GLUCOSE, CAPILLARY
Glucose-Capillary: 145 mg/dL — ABNORMAL HIGH (ref 70–99)
Glucose-Capillary: 201 mg/dL — ABNORMAL HIGH (ref 70–99)
Glucose-Capillary: 206 mg/dL — ABNORMAL HIGH (ref 70–99)

## 2020-01-31 MED ORDER — ALBUTEROL SULFATE HFA 108 (90 BASE) MCG/ACT IN AERS
2.0000 | INHALATION_SPRAY | Freq: Four times a day (QID) | RESPIRATORY_TRACT | 0 refills | Status: DC | PRN
Start: 2020-01-31 — End: 2021-01-31

## 2020-01-31 MED ORDER — MAGNESIUM SULFATE 4 GM/100ML IV SOLN
4.0000 g | Freq: Once | INTRAVENOUS | Status: AC
  Administered 2020-01-31: 4 g via INTRAVENOUS
  Filled 2020-01-31: qty 100

## 2020-01-31 MED ORDER — MAGNESIUM OXIDE 400 (241.3 MG) MG PO TABS
800.0000 mg | ORAL_TABLET | Freq: Two times a day (BID) | ORAL | Status: DC
  Administered 2020-01-31: 800 mg via ORAL
  Filled 2020-01-31: qty 2

## 2020-01-31 MED ORDER — AMLODIPINE BESYLATE 10 MG PO TABS
10.0000 mg | ORAL_TABLET | Freq: Every day | ORAL | 0 refills | Status: DC
Start: 2020-02-01 — End: 2020-12-08

## 2020-01-31 NOTE — Progress Notes (Signed)
Occupational Therapy Treatment Patient Details Name: Kevin Patel MRN: [id number] DOB: 07-10-1945 Today's Date: 01/31/2020    History of present illness 74 yo male presenting with weakness, fatigue, and poor appetite. Tested COVID positive. PMH including ischemic cardiomyopathy last EF measured was 30 to 35% status post ICD, CAD s/p stenting, diabetes mellitus,  hypertension, and hyperlipidemia.   OT comments  Pt progressing well towards established OT goals. Pt performing ADLs and functional mobility at Supervision. Maintaining SpO2 >89% on RA throughout mobility. Providing pt with handout and education on energy conservation for ADLs and IADLs; pt verbalized understanding. Continue to recommend dc to home once medically stable per physician. Answering questions in preparation for dc to home later today.    Follow Up Recommendations  No OT follow up;Supervision - Intermittent    Equipment Recommendations  None recommended by OT    Recommendations for Other Services PT consult    Precautions / Restrictions Precautions Precautions: Fall;Other (comment) Precaution Comments: monitor sats       Mobility Bed Mobility Overal bed mobility: Modified Independent             General bed mobility comments: Increased time  Transfers Overall transfer level: Needs assistance Equipment used: None Transfers: Sit to/from Stand Sit to Stand: Supervision         General transfer comment: Supervision for safety    Balance Overall balance assessment: No apparent balance deficits (not formally assessed)                                         ADL either performed or assessed with clinical judgement   ADL Overall ADL's : Needs assistance/impaired                                     Functional mobility during ADLs: Supervision/safety General ADL Comments: Pt performing functional mobility in hallway with Supervision. Providing education on energy  conservation techniques for ADLs and IADLs. Pt verablized understanding     Vision       Perception     Praxis      Cognition Arousal/Alertness: Awake/alert Behavior During Therapy: WFL for tasks assessed/performed Overall Cognitive Status: Within Functional Limits for tasks assessed                                 General Comments: Very motivated        Exercises     Shoulder Instructions       General Comments SpO2 >89% on RA throughout    Pertinent Vitals/ Pain       Pain Assessment: No/denies pain  Home Living                                          Prior Functioning/Environment              Frequency  Min 2X/week        Progress Toward Goals  OT Goals(current goals can now be found in the care plan section)  Progress towards OT goals: Progressing toward goals  Acute Rehab OT Goals Patient Stated Goal: Go home to my wife OT Goal Formulation: With  patient Time For Goal Achievement: 02/12/20 Potential to Achieve Goals: Good ADL Goals Pt Will Perform Grooming: with modified independence;standing Additional ADL Goal #1: Pt will independently verbalize three energy conservation techniques for ADLs and IADLs Additional ADL Goal #2: Pt will independently monitor SpO2 and use purse lip breathing during ADLs  Plan Discharge plan remains appropriate    Co-evaluation                 AM-PAC OT "6 Clicks" Daily Activity     Outcome Measure   Help from another person eating meals?: None Help from another person taking care of personal grooming?: A Little Help from another person toileting, which includes using toliet, bedpan, or urinal?: A Little Help from another person bathing (including washing, rinsing, drying)?: A Little Help from another person to put on and taking off regular upper body clothing?: A Little Help from another person to put on and taking off regular lower body clothing?: A Little 6 Click  Score: 19    End of Session    OT Visit Diagnosis: Unsteadiness on feet (R26.81);Other abnormalities of gait and mobility (R26.89);Muscle weakness (generalized) (M62.81)   Activity Tolerance Patient tolerated treatment well   Patient Left in chair;with call bell/phone within reach   Nurse Communication Mobility status        Time: 1010-1035 OT Time Calculation (min): 25 min  Charges: OT General Charges $OT Visit: 1 Visit OT Treatments $Self Care/Home Management : 8-22 mins $Therapeutic Activity: 8-22 mins  Kevin Patel MSOT, OTR/L Acute Rehab Pager: [phone] Office: [phone]   Kevin Patel Kevin Patel 01/31/2020, 12:33 PM

## 2020-01-31 NOTE — TOC Initial Note (Signed)
Transition of Care [facility]) - Initial/Assessment Note    Patient Details  Name: Kevin Patel MRN: [id number] Date of Birth: 01-Jan-1946  [facility]) CM/SW Contact:    Kevin Kinds, RN Phone Number: [phone] 01/31/2020, 11:48 AM  Clinical Narrative:                  Patient to transition home today. Attempted to contact patient on room phone and on cell. Spoke with spouse, Kevin Patel, on home phone per [facility]. PTA patient home with spouse. Spouse states that patient would be agreeable to [facility]. Offered agency choice. [facility] not accepting new referrals d/t limited staffing at the moment. Referral accepted by [facility] for [facility] RN, PT. Spouse agreeable. No further TOC needs identified at this time.   Expected Discharge Plan: Home w Home Health Services Barriers to Discharge: No Barriers Identified   Patient Goals and CMS Choice Patient states their goals for this hospitalization and ongoing recovery are:: return home with spouse CMS Medicare.gov Compare Post Acute Care list provided to:: Patient Choice offered to / list presented to : Spouse  Expected Discharge Plan and Services Expected Discharge Plan: Home w Home Health Services In-house Referral: NA Discharge Planning Services: CM Consult Post Acute Care Choice: Home Health Living arrangements for the past 2 months: Single Family Home Expected Discharge Date: 01/31/20                         HH Arranged: RN, PT HH Agency: [facility] Date [facility] Agency Contacted: 01/31/20 Time HH Agency Contacted: 1147 Representative spoke with at [facility] Agency: Kevin Patel  Prior Living Arrangements/Services Living arrangements for the past 2 months: Single Family Home Lives with:: Self, Spouse                   Activities of Daily Living Home Assistive Devices/Equipment: None ADL Screening (condition at time of admission) Patient's cognitive ability adequate to safely complete daily activities?: Yes Is the  patient deaf or have difficulty hearing?: No Does the patient have difficulty seeing, even when wearing glasses/contacts?: No Does the patient have difficulty concentrating, remembering, or making decisions?: No Patient able to express need for assistance with ADLs?: No Does the patient have difficulty dressing or bathing?: No Independently performs ADLs?: Yes (appropriate for developmental age) Does the patient have difficulty walking or climbing stairs?: No Weakness of Legs: Both Weakness of Arms/Hands: None  Permission Sought/Granted                  Emotional Assessment              Admission diagnosis:  Dehydration [E86.0] ARF (acute renal failure) (HCC) [N17.9] AKI (acute kidney injury) (HCC) [N17.9] COVID-19 virus infection [U07.1] Patient Active Problem List   Diagnosis Date Noted  . ARF (acute renal failure) (HCC) 01/26/2020  . COVID-19 virus infection 01/26/2020  . Dehydration   . Recurrent UTI 03/25/2018  . Anemia, chronic disease 09/10/2017  . Dysuria 09/10/2017  . At risk for sudden cardiac death 03-06-2017  . Chronic systolic heart failure (HCC) 2017/03/06  . ICD (implantable cardioverter-defibrillator) in place 2017/03/06  . Hypothyroidism (acquired) 09/11/2016  . CKD (chronic kidney disease) stage 3, GFR 30-59 ml/min 09/10/2016  . History of MI (myocardial infarction) 09/10/2016  . Diabetes mellitus with coincident hypertension (HCC) 09/09/2016  . Acute kidney injury (HCC) 09/07/2016  . 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  NSTEMI (non-ST elevated myocardial infarction) (HCC) 09/05/2016  . Unstable angina (HCC) 09/05/2016  . Class 1 obesity due to excess calories with serious comorbidity and body mass index (BMI) of 31.0 to 31.9 in adult 08/11/2015  . Coronary arteriosclerosis 08/11/2015  . Diabetes, polyneuropathy (HCC) 08/11/2015  . Elevated PSA 08/11/2015  . Hyperlipidemia 08/11/2015  . Long-term use of high-risk  medication 08/11/2015  . Osteoarthritis 08/11/2015  . Ischemic cardiomyopathy 02/12/2013  . Essential hypertension 02/12/2013  . Dyslipidemia 02/12/2013   PCP:  Kevin Payment MD Pharmacy:   [facility] [address] [place] - [address] [place] [address] Phone: [phone] Fax: [phone]     Social Determinants of Health (SDOH) Interventions    Readmission Risk Interventions No flowsheet data found.

## 2020-01-31 NOTE — Progress Notes (Signed)
Kevin Patel to be D/C'd Home per MD order.  Discussed with the patient and all questions fully answered.  VSS, Skin clean, dry and intact without evidence of skin break down, no evidence of skin tears noted. IV catheter discontinued intact. Site without signs and symptoms of complications. Dressing and pressure applied.  An After Visit Summary was printed and given to the patient. Patient received prescription.  D/c education completed with patient/family including follow up instructions, medication list, d/c activities limitations if indicated, with other d/c instructions as indicated by MD - patient able to verbalize understanding, all questions fully answered.   Patient instructed to return to ED, call 911, or call MD for any changes in condition.   Patient escorted via WC, and D/C home via private auto.  [name] 01/31/2020 12:27 PM

## 2020-01-31 NOTE — Discharge Summary (Signed)
Kevin Patel ZOX:[id number] DOB: 1945/10/14 DOA: 01/26/2020  PCP: Raina Mina., MD  Admit date: 01/26/2020  Discharge date: 01/31/2020  Admitted From: Home   Disposition:  Home   Recommendations for Outpatient Follow-up:   Follow up with PCP in 1-2 weeks  PCP Please obtain BMP/CBC, 2 view CXR in 1week,  (see Discharge instructions)   PCP Please follow up on the following pending results: Check CBC, CMP, magnesium and a two-view chest x-ray in 7 to 10 days.   Home Health: None  Equipment/Devices: None  Consultations: None  Discharge Condition: Stable    CODE STATUS: Full    Diet Recommendation: Heart Healthy Low Carb    Chief Complaint  Patient presents with  . Weakness  . Illness     Brief history of present illness from the day of admission and additional interim summary    Kevin Patel a 73 y.o.malewithhistory of ischemic cardiomyopathy last EF measured was 30 to 35% status post ICD, CAD status post stenting diabetes mellitus hypertension hyperlipidemia presents to the ER because of increasing weakness fatigue and poor appetite, was diagnosed with COVID-19 pneumonia and admitted to the hospital.  Unfortunately he is not vaccinated.                                                                  Hospital Course   1. Acute Hypoxic Resp. Failure due to Acute Covid 19 Viral Pneumonitis during the ongoing 2020 Covid 19 Pandemic - he had moderate disease, and was treated with IV steroids and remdesivir, responded very well and is symptom-free on room air, will be discharged after his final dose today with outpatient PCP follow-up.   SpO2: 95 %  Recent Labs  Lab 01/26/20 1528 01/26/20 2028 01/27/20 0519 01/28/20 0500 01/29/20 0749  CRP  --  3.8* 4.0* 4.0* 1.7*  DDIMER  --  1.11* 1.13*  0.84* 0.74*  FERRITIN  --  782*  --   --   --   BNP  --   --  725.4* 838.5* 1,417.1*  PROCALCITON  --  <0.10  --   --   --   SARSCOV2NAA POSITIVE*  --   --   --   --     Hepatic Function Latest Ref Rng & Units 01/29/2020 01/28/2020 01/27/2020  Total Protein 6.5 - 8.1 g/dL 6.1(L) 6.4(L) 6.5  Albumin 3.5 - 5.0 g/dL 2.9(L) 2.9(L) 3.2(L)  AST 15 - 41 U/L 40 38 37  ALT 0 - 44 U/L _0 Alk Phosphatase 38 - 126 U/L 42 47 44  Total Bilirubin 0.3 - 1.2 mg/dL 0.9 0.7 0.9  Bilirubin, Direct 0.0 - 0.2 mg/dL - - -     2.  History of chronic ischemic cardiomyopathy with chronic systolic heart failure  EF 35% s/p AICD placement.  [name] appears compensated will continue combination of aspirin, Coreg and statin for secondary prevention along with monitoring.  3.  CAD.  As above.  4.  Mild COVID-19 viral infection related thrombocytopenia, resolved.  5.  Hypomagnesemia.    Has been replaced IV and oral today prior to discharge PCP to recheck next visit.  6.  AKI on CKD 3A.  Almost resolved after hydration and close to baseline.  7. HTN - placed on Norvasc.     Discharge diagnosis     Principal Problem:   ARF (acute renal failure) (HCC) Active Problems:   Ischemic cardiomyopathy   Essential hypertension   Diabetes mellitus with coincident hypertension ([place])   ICD (implantable cardioverter-defibrillator) in place   CKD (chronic kidney disease) stage 3, GFR 30-59 ml/min   Hypothyroidism (acquired)   COVID-19 virus infection    Discharge instructions    Discharge Instructions    Discharge instructions   Complete by: As directed    Follow with Primary MD Raina Mina., MD in 7 days   Get CBC, CMP, Magnesium, 2 view Chest X ray -  checked next visit within 1 week by Primary MD   Activity: As tolerated with Full fall precautions use walker/cane & assistance as needed  Disposition Home    Diet: Heart Healthy Low Carb  Special Instructions: If you have smoked or  chewed Tobacco  in the last 2 yrs please stop smoking, stop any regular Alcohol  and or any Recreational drug use.  On your next visit with your primary care physician please Get Medicines reviewed and adjusted.  Please request your Prim.MD to go over all Hospital Tests and Procedure/Radiological results at the follow up, please get all Hospital records sent to your Prim MD by signing hospital release before you go home.  If you experience worsening of your admission symptoms, develop shortness of breath, life threatening emergency, suicidal or homicidal thoughts you must seek medical attention immediately by calling 911 or calling your MD immediately  if symptoms less severe.  You Must read complete instructions/literature along with all the possible adverse reactions/side effects for all the Medicines you take and that have been prescribed to you. Take any new Medicines after you have completely understood and accpet all the possible adverse reactions/side effects.   Increase activity slowly   Complete by: As directed    MyChart COVID-19 home monitoring program   Complete by: Jan 31, 2020    Is the patient willing to use the [place] [facility] for home monitoring?: Yes   Temperature monitoring   Complete by: Jan 31, 2020    After how many days would you like to receive a notification of this patient's flowsheet entries?: 1      Discharge Medications   Allergies as of 01/31/2020   No Known Allergies     Medication List    TAKE these medications   acetaminophen 325 MG tablet Commonly known as: TYLENOL Take 2 tablets (650 mg total) by mouth every 4 (four) hours as needed for headache or mild pain.   albuterol 108 (90 Base) MCG/ACT inhaler Commonly known as: VENTOLIN HFA Inhale 2 puffs into the lungs every 6 (six) hours as needed for wheezing or shortness of breath.   amLODipine 10 MG tablet Commonly known as: NORVASC Take 1 tablet (10 mg total) by mouth daily. Start taking on:  February 01, 2020   aspirin 81 MG tablet Take 81 mg by mouth every  morning.   atorvastatin 80 MG tablet Commonly known as: LIPITOR Take 1 tablet by mouth once daily   carvedilol 12.5 MG tablet Commonly known as: COREG Take 1 tablet by mouth twice daily What changed: when to take this   co-enzyme Q-10 30 MG capsule Take 30 mg by mouth daily.   losartan-hydrochlorothiazide 100-12.5 MG tablet Commonly known as: HYZAAR Take 1 tablet by mouth once daily What changed: how to take this   multivitamin capsule Take 1 capsule by mouth daily.   nitroGLYCERIN 0.4 MG SL tablet Commonly known as: NITROSTAT Place 0.4 mg under the tongue every 5 (five) minutes as needed for chest pain.   omega-3 acid ethyl esters 1 g capsule Commonly known as: LOVAZA Take 1 g by mouth daily.   vitamin C 500 MG tablet Commonly known as: ASCORBIC ACID Take 500 mg by mouth daily.   vitamin E 1000 UNIT capsule Take 1,000 Units by mouth daily.        Follow-up Information    Raina Mina., MD. Schedule an appointment as soon as possible for a visit in 1 week(s).   Specialty: Internal Medicine Contact information: [address] [place] [address] [phone]               Major procedures and Radiology Reports - PLEASE review detailed and final reports thoroughly  -       DG Chest [facility] 1 View  Result Date: 01/26/2020 CLINICAL DATA:  COVID-19 positive, shortness of breath, weakness EXAM: PORTABLE CHEST 1 VIEW COMPARISON:  02/13/2017 FINDINGS: Single frontal view of the chest demonstrates stable single lead pacer. Cardiac silhouette is unremarkable. Bibasilar interstitial prominence is noted, with superimposed patchy airspace disease at the lung bases. No effusion or pneumothorax. IMPRESSION: 1. Bibasilar ground-glass airspace disease superimposed upon interstitial prominence, consistent with multifocal COVID-19 pneumonia. Electronically Signed   By: Randa Ngo M.D.   On: 01/26/2020  20:58    Micro Results     Recent Results (from the past 240 hour(s))  SARS Coronavirus 2 by RT PCR (hospital order, performed in [facility] hospital lab) Nasopharyngeal Nasopharyngeal Swab     Status: Abnormal   Collection Time: 01/26/20  3:28 PM   Specimen: Nasopharyngeal Swab  Result Value Ref Range Status   SARS Coronavirus 2 POSITIVE (A) NEGATIVE Final    Comment: RESULT CALLED TO, READ BACK BY AND VERIFIED WITH: S,BERTRAND _0  01/26/20 EB (NOTE) SARS-CoV-2 target nucleic acids are DETECTED  SARS-CoV-2 RNA is generally detectable in upper respiratory specimens  during the acute phase of infection.  Positive results are indicative  of the presence of the identified virus, but do not rule out bacterial infection or co-infection with other pathogens not detected by the test.  Clinical correlation with patient history and  other diagnostic information is necessary to determine patient infection status.  The expected result is negative.  Fact Sheet for Patients:   StrictlyIdeas.no   Fact Sheet for Healthcare Providers:   BankingDealers.co.za    This test is not yet approved or cleared by the [place] FDA and  has been authorized for detection and/or diagnosis of SARS-CoV-2 by FDA under an Emergency Use Authorization (EUA).  This EUA will remain in effect (meaning this test can  be used) for the duration of  the COVID-19 declaration under Section 564(b)(1) of the Act, 21 U.S.C. section 360-bbb-3(b)(1), unless the authorization is terminated or revoked sooner.  Performed at [facility], [place],  [place] [address]   Blood Culture (routine x 2)     Status: None   Collection Time: 01/26/20 10:40 PM   Specimen: BLOOD RIGHT HAND  Result Value Ref Range Status   Specimen Description BLOOD RIGHT HAND  Final   Special Requests   Final    BOTTLES DRAWN AEROBIC AND ANAEROBIC Blood Culture adequate volume     Culture   Final    NO GROWTH 5 DAYS Performed at [facility], [place] [address] [place], [place] [address]    Report Status 01/31/2020 FINAL  Final  Blood Culture (routine x 2)     Status: None   Collection Time: 01/26/20 10:43 PM   Specimen: BLOOD LEFT HAND  Result Value Ref Range Status   Specimen Description BLOOD LEFT HAND  Final   Special Requests   Final    BOTTLES DRAWN AEROBIC AND ANAEROBIC Blood Culture adequate volume   Culture   Final    NO GROWTH 5 DAYS Performed at [facility], [place] [address], [place], [place] [address]    Report Status 01/31/2020 FINAL  Final    Today   Subjective    Kevin Patel today has no headache,no chest abdominal pain,no new weakness tingling or numbness, feels much better wants to go home today.     Objective   Blood pressure 130/83, pulse 60, temperature 97.8 F (36.6 C), temperature source Oral, resp. rate 19, height _0  (1.93 m), weight 97.6 kg, SpO2 95 %.   Intake/Output Summary (Last 24 hours) at 01/31/2020 0946 Last data filed at 01/31/2020 0536 Gross per 24 hour  Intake 300.44 ml  Output 852 ml  Net -551.56 ml    Exam  Awake Alert, No new F.N deficits, Normal affect .AT,PERRAL Supple Neck,No JVD, No cervical lymphadenopathy appriciated.  Symmetrical Chest wall movement, Good air movement bilaterally, CTAB RRR,No Gallops,Rubs or new Murmurs, No Parasternal Heave +ve B.Sounds, Abd Soft, Non tender, No organomegaly appriciated, No rebound -guarding or rigidity. No Cyanosis, Clubbing or edema, No new Rash or bruise   Data Review   CBC w Diff:  Lab Results  Component Value Date   WBC 9.7 01/29/2020   HGB 13.4 01/29/2020   HGB 13.9 02/06/2017   HCT 38.7 (L) 01/29/2020   HCT 39.9 02/06/2017   PLT 188 01/29/2020   PLT 206 02/06/2017   LYMPHOPCT 10 01/29/2020   MONOPCT 5 01/29/2020   EOSPCT 0 01/29/2020   BASOPCT 0 01/29/2020    CMP:  Lab Results  Component Value Date   NA 141 01/29/2020   NA  140 07/22/2018   K 3.7 01/29/2020   CL 108 01/29/2020   CO2 21 (L) 01/29/2020   BUN 32 (H) 01/29/2020   BUN 16 07/22/2018   CREATININE 1.25 (H) 01/29/2020   CREATININE 1.26 (H) 09/16/2016   CREATININE 1.26 (H) 09/16/2016   PROT 6.1 (L) 01/29/2020   PROT 6.8 07/22/2018   ALBUMIN 2.9 (L) 01/29/2020   ALBUMIN 4.5 07/22/2018   BILITOT 0.9 01/29/2020   BILITOT 0.7 07/22/2018   ALKPHOS 42 01/29/2020   AST 40 01/29/2020   ALT 23 01/29/2020  .   Total Time in preparing paper work, data evaluation and todays exam - 21 minutes  Lala Lund M.D on 01/31/2020 at 9:46 AM  [facility]   Office  [phone]

## 2020-01-31 NOTE — Discharge Instructions (Signed)
Follow with Primary MD [name]., MD in 7 days   Get CBC, CMP, Magnesium, 2 view Chest X ray -  checked next visit within 1 week by Primary MD   Activity: As tolerated with Full fall precautions use walker/cane & assistance as needed  Disposition Home    Diet: Heart Healthy Low Carb  Special Instructions: If you have smoked or chewed Tobacco  in the last 2 yrs please stop smoking, stop any regular Alcohol  and or any Recreational drug use.  On your next visit with your primary care physician please Get Medicines reviewed and adjusted.  Please request your Prim.MD to go over all Hospital Tests and Procedure/Radiological results at the follow up, please get all Hospital records sent to your Prim MD by signing hospital release before you go home.  If you experience worsening of your admission symptoms, develop shortness of breath, life threatening emergency, suicidal or homicidal thoughts you must seek medical attention immediately by calling 911 or calling your MD immediately  if symptoms less severe.  You Must read complete instructions/literature along with all the possible adverse reactions/side effects for all the Medicines you take and that have been prescribed to you. Take any new Medicines after you have completely understood and accpet all the possible adverse reactions/side effects.         Person Under Monitoring Name: Kevin Patel  Location: [address] [place] [address]   Infection Prevention Recommendations for Individuals Confirmed to have, or Being Evaluated for, 2019 Novel Coronavirus (COVID-19) Infection Who Receive Care at Home  Individuals who are confirmed to have, or are being evaluated for, COVID-19 should follow the prevention steps below until a healthcare provider or local or state health department says they can return to normal activities.  Stay home except to get medical care You should restrict activities outside your home, except for getting  medical care. Do not go to work, school, or public areas, and do not use public transportation or taxis.  Call ahead before visiting your doctor Before your medical appointment, call the healthcare provider and tell them that you have, or are being evaluated for, COVID-19 infection. This will help the healthcare provider's office take steps to keep other people from getting infected. Ask your healthcare provider to call the local or state health department.  Monitor your symptoms Seek prompt medical attention if your illness is worsening (e.g., difficulty breathing). Before going to your medical appointment, call the healthcare provider and tell them that you have, or are being evaluated for, COVID-19 infection. Ask your healthcare provider to call the local or state health department.  Wear a facemask You should wear a facemask that covers your nose and mouth when you are in the same room with other people and when you visit a healthcare provider. People who live with or visit you should also wear a facemask while they are in the same room with you.  Separate yourself from other people in your home As much as possible, you should stay in a different room from other people in your home. Also, you should use a separate bathroom, if available.  Avoid sharing household items You should not share dishes, drinking glasses, cups, eating utensils, towels, bedding, or other items with other people in your home. After using these items, you should wash them thoroughly with soap and water.  Cover your coughs and sneezes Cover your mouth and nose with a tissue when you cough or sneeze, or you  can cough or sneeze into your sleeve. Throw used tissues in a lined trash can, and immediately wash your hands with soap and water for at least 20 seconds or use an alcohol-based hand rub.  Wash your [facility] your hands often and thoroughly with soap and water for at least 20 seconds. You can use an  alcohol-based hand sanitizer if soap and water are not available and if your hands are not visibly dirty. Avoid touching your eyes, nose, and mouth with unwashed hands.   Prevention Steps for Caregivers and Household Members of Individuals Confirmed to have, or Being Evaluated for, COVID-19 Infection Being Cared for in the Home  If you live with, or provide care at home for, a person confirmed to have, or being evaluated for, COVID-19 infection please follow these guidelines to prevent infection:  Follow healthcare provider's instructions Make sure that you understand and can help the patient follow any healthcare provider instructions for all care.  Provide for the patient's basic needs You should help the patient with basic needs in the home and provide support for getting groceries, prescriptions, and other personal needs.  Monitor the patient's symptoms If they are getting sicker, call his or her medical provider and tell them that the patient has, or is being evaluated for, COVID-19 infection. This will help the healthcare provider's office take steps to keep other people from getting infected. Ask the healthcare provider to call the local or state health department.  Limit the number of people who have contact with the patient  If possible, have only one caregiver for the patient.  Other household members should stay in another home or place of residence. If this is not possible, they should stay  in another room, or be separated from the patient as much as possible. Use a separate bathroom, if available.  Restrict visitors who do not have an essential need to be in the home.  Keep older adults, very young children, and other sick people away from the patient Keep older adults, very young children, and those who have compromised immune systems or chronic health conditions away from the patient. This includes people with chronic heart, lung, or kidney conditions, diabetes, and  cancer.  Ensure good ventilation Make sure that shared spaces in the home have good air flow, such as from an air conditioner or an opened window, weather permitting.  Wash your hands often  Wash your hands often and thoroughly with soap and water for at least 20 seconds. You can use an alcohol based hand sanitizer if soap and water are not available and if your hands are not visibly dirty.  Avoid touching your eyes, nose, and mouth with unwashed hands.  Use disposable paper towels to dry your hands. If not available, use dedicated cloth towels and replace them when they become wet.  Wear a facemask and gloves  Wear a disposable facemask at all times in the room and gloves when you touch or have contact with the patient's blood, body fluids, and/or secretions or excretions, such as sweat, saliva, sputum, nasal mucus, vomit, urine, or feces.  Ensure the mask fits over your nose and mouth tightly, and do not touch it during use.  Throw out disposable facemasks and gloves after using them. Do not reuse.  Wash your hands immediately after removing your facemask and gloves.  If your personal clothing becomes contaminated, carefully remove clothing and launder. Wash your hands after handling contaminated clothing.  Place all used disposable facemasks, gloves,  and other waste in a lined container before disposing them with other household waste.  Remove gloves and wash your hands immediately after handling these items.  Do not share dishes, glasses, or other household items with the patient  Avoid sharing household items. You should not share dishes, drinking glasses, cups, eating utensils, towels, bedding, or other items with a patient who is confirmed to have, or being evaluated for, COVID-19 infection.  After the person uses these items, you should wash them thoroughly with soap and water.  Wash laundry thoroughly  Immediately remove and wash clothes or bedding that have blood, body  fluids, and/or secretions or excretions, such as sweat, saliva, sputum, nasal mucus, vomit, urine, or feces, on them.  Wear gloves when handling laundry from the patient.  Read and follow directions on labels of laundry or clothing items and detergent. In general, wash and dry with the warmest temperatures recommended on the label.  Clean all areas the individual has used often  Clean all touchable surfaces, such as counters, tabletops, doorknobs, bathroom fixtures, toilets, phones, keyboards, tablets, and bedside tables, every day. Also, clean any surfaces that may have blood, body fluids, and/or secretions or excretions on them.  Wear gloves when cleaning surfaces the patient has come in contact with.  Use a diluted bleach solution (e.g., dilute bleach with 1 part bleach and 10 parts water) or a household disinfectant with a label that says EPA-registered for coronaviruses. To make a bleach solution at home, add 1 tablespoon of bleach to 1 quart (4 cups) of water. For a larger supply, add  cup of bleach to 1 gallon (16 cups) of water.  Read labels of cleaning products and follow recommendations provided on product labels. Labels contain instructions for safe and effective use of the cleaning product including precautions you should take when applying the product, such as wearing gloves or eye protection and making sure you have good ventilation during use of the product.  Remove gloves and wash hands immediately after cleaning.  Monitor yourself for signs and symptoms of illness Caregivers and household members are considered close contacts, should monitor their health, and will be asked to limit movement outside of the home to the extent possible. Follow the monitoring steps for close contacts listed on the symptom monitoring form.   ? If you have additional questions, contact your local health department or call the epidemiologist on call at [phone] (available 24/7). ? This  guidance is subject to change. For the most up-to-date guidance from [facility], please refer to their website: YouBlogs.pl

## 2020-02-02 ENCOUNTER — Ambulatory Visit (INDEPENDENT_AMBULATORY_CARE_PROVIDER_SITE_OTHER): Payer: Medicare Other | Admitting: *Deleted

## 2020-02-02 DIAGNOSIS — I255 Ischemic cardiomyopathy: Secondary | ICD-10-CM | POA: Diagnosis not present

## 2020-02-03 LAB — CUP PACEART REMOTE DEVICE CHECK
Battery Remaining Longevity: 116 mo
Battery Voltage: 3.01 V
Brady Statistic RV Percent Paced: 0.01 %
Date Time Interrogation Session: 20210826110026
HighPow Impedance: 67 Ohm
Implantable Lead Implant Date: 20180905
Implantable Lead Location: 753860
Implantable Pulse Generator Implant Date: 20180905
Lead Channel Impedance Value: 361 Ohm
Lead Channel Impedance Value: 399 Ohm
Lead Channel Pacing Threshold Amplitude: 0.625 V
Lead Channel Pacing Threshold Pulse Width: 0.4 ms
Lead Channel Sensing Intrinsic Amplitude: 13 mV
Lead Channel Sensing Intrinsic Amplitude: 13 mV
Lead Channel Setting Pacing Amplitude: 2.5 V
Lead Channel Setting Pacing Pulse Width: 0.4 ms
Lead Channel Setting Sensing Sensitivity: 0.3 mV

## 2020-02-07 MED FILL — Enoxaparin Sodium Inj 40 MG/0.4ML: SUBCUTANEOUS | Qty: 0.4 | Status: AC

## 2020-02-07 MED FILL — Magnesium Sulfate IV Soln 4 GM/100ML (40 MG/ML): INTRAVENOUS | Qty: 100 | Status: AC

## 2020-02-07 MED FILL — Magnesium Oxide Tab 400 MG (240 MG Elemental Mg): ORAL | Qty: 2 | Status: AC

## 2020-02-07 MED FILL — Omega-3-acid Ethyl Esters Cap 1 GM: ORAL | Qty: 1 | Status: AC

## 2020-02-07 MED FILL — Aspirin Tab Delayed Release 81 MG: ORAL | Qty: 81 | Status: AC

## 2020-02-07 MED FILL — Carvedilol Tab 12.5 MG: ORAL | Qty: 12.5 | Status: AC

## 2020-02-07 MED FILL — Amlodipine Besylate Tab 10 MG (Base Equivalent): ORAL | Qty: 10 | Status: AC

## 2020-02-07 MED FILL — Atorvastatin Calcium Tab 80 MG (Base Equivalent): ORAL | Qty: 80 | Status: AC

## 2020-02-08 NOTE — Progress Notes (Signed)
Remote ICD transmission.   

## 2020-05-03 ENCOUNTER — Ambulatory Visit (INDEPENDENT_AMBULATORY_CARE_PROVIDER_SITE_OTHER): Payer: Medicare Other

## 2020-05-03 DIAGNOSIS — I255 Ischemic cardiomyopathy: Secondary | ICD-10-CM | POA: Diagnosis not present

## 2020-05-10 ENCOUNTER — Telehealth: Payer: Self-pay | Admitting: Emergency Medicine

## 2020-05-10 LAB — CUP PACEART REMOTE DEVICE CHECK
Battery Remaining Longevity: 111 mo
Battery Voltage: 3.01 V
Brady Statistic RV Percent Paced: 0.01 %
Date Time Interrogation Session: 20211130044226
HighPow Impedance: 68 Ohm
Implantable Lead Implant Date: 20180905
Implantable Lead Location: 753860
Implantable Pulse Generator Implant Date: 20180905
Lead Channel Impedance Value: 361 Ohm
Lead Channel Impedance Value: 399 Ohm
Lead Channel Pacing Threshold Amplitude: 0.5 V
Lead Channel Pacing Threshold Pulse Width: 0.4 ms
Lead Channel Sensing Intrinsic Amplitude: 11.5 mV
Lead Channel Sensing Intrinsic Amplitude: 11.5 mV
Lead Channel Setting Pacing Amplitude: 2.5 V
Lead Channel Setting Pacing Pulse Width: 0.4 ms
Lead Channel Setting Sensing Sensitivity: 0.3 mV

## 2020-05-10 NOTE — Telephone Encounter (Signed)
Returned the call to the patient. An appointment has been made for 06/21/20 with Dr. Royann Shivers. He has been advised to call back if he develops swelling or shortness of breath.

## 2020-05-10 NOTE — Telephone Encounter (Signed)
Optivol elevated. Contacted patient and he reports edema in BLE. No SOB, CP, chest pressureHe currently is not on a diuretic, does not doe daily weights, and has not been monitoring salt intake. Education done on daily weights and decreased sodium intake. Patient informed he will receive call if treatment plan will change. Will forward to Dr Royann Shivers for evaluation.

## 2020-05-10 NOTE — Telephone Encounter (Signed)
Optivol recheck scheduled for 05/23/2020 as requested by Dr Royann Shivers.

## 2020-05-10 NOTE — Telephone Encounter (Signed)
Misty Stanley, can we bring him in first available in January please. Ask him to call us promptly if he has dyspnea or edema in the meantime. Arline Asp, can you please set up another download for Optivol in 2 weeks with Jacki Cones, please? Thanks

## 2020-05-11 NOTE — Progress Notes (Signed)
Remote ICD transmission.   

## 2020-05-23 ENCOUNTER — Telehealth: Payer: Self-pay

## 2020-05-23 ENCOUNTER — Ambulatory Visit (INDEPENDENT_AMBULATORY_CARE_PROVIDER_SITE_OTHER): Payer: Medicare Other

## 2020-05-23 DIAGNOSIS — I5022 Chronic systolic (congestive) heart failure: Secondary | ICD-10-CM | POA: Diagnosis not present

## 2020-05-23 DIAGNOSIS — Z95810 Presence of automatic (implantable) cardiac defibrillator: Secondary | ICD-10-CM | POA: Diagnosis not present

## 2020-05-23 NOTE — Progress Notes (Signed)
Will enroll patient in monthly ICM follow up and next remote transmission scheduled for 06/05/2020 to recheck fluid levels.

## 2020-05-23 NOTE — Progress Notes (Signed)
EPIC Encounter for ICM Monitoring  Patient Name: Kevin Patel is a 74 y.o. male Date: 05/23/2020 Primary Care Physican: [name]., MD Primary Cardiologist: Croitoru Electrophysiologist: Croitoru Weight:  unknown       Dr Royann Shivers requested 2 week Optivol recheck after impedance suggesting possible fluid accumulation alert on 05/10/2020.    Attempted ICM intro and remote transmission call to patient and unable to reach.  Transmission reviewed.      Optivol thoracic impedance trending close to baseline normal but suggesting days of possible fluid accumulation since 12/1 remote transmission.   Optivol threshold returned to below threshold line since 12/1 remote transmission.   Prescribed: No diuretic  Recommendations: Unable to reach.  Follow-up plan:  ICM clinic phone appointment pending review by Dr Royann Shivers and if monthly follow up should continue.   91 day device clinic remote transmission 08/02/2020.    EP/Cardiology Office Visits: 06/21/2020 with Dr. Royann Shivers.    Copy of ICM check sent to Dr. Harrold Donath.   3 month ICM trend: 05/23/2020    1 Year ICM trend:       Karie Soda, RN 05/23/2020 11:47 AM

## 2020-05-23 NOTE — Telephone Encounter (Signed)
Remote ICM transmission received.  Attempted call to patient regarding ICM remote transmission and no answer or voice mail option.  

## 2020-05-23 NOTE — Telephone Encounter (Signed)
Remote ICM transmission received.  Attempted call to patient regarding ICM remote transmission and no answer.  

## 2020-05-23 NOTE — Progress Notes (Signed)
Thanks, Jacki Cones. I think that would be a good idea.

## 2020-06-05 ENCOUNTER — Ambulatory Visit (INDEPENDENT_AMBULATORY_CARE_PROVIDER_SITE_OTHER): Payer: Medicare Other

## 2020-06-05 DIAGNOSIS — I5022 Chronic systolic (congestive) heart failure: Secondary | ICD-10-CM

## 2020-06-05 DIAGNOSIS — Z95810 Presence of automatic (implantable) cardiac defibrillator: Secondary | ICD-10-CM

## 2020-06-06 NOTE — Progress Notes (Signed)
Thanks

## 2020-06-06 NOTE — Progress Notes (Signed)
EPIC Encounter for ICM Monitoring  Patient Name: Kevin Patel is a 74 y.o. male Date: 06/06/2020 Primary Care Physican: [name]., MD Primary Cardiologist: Croitoru Electrophysiologist: Croitoru Weight:  unknown                                                           Spoke with wife per DPR and provided ICM intro.  She agreed to monthly ICM follow up on behalf of patient.  Patient bowls 3 times a week and he was at the bowling alley today.  He has not expereince any fluid symptoms.  She said over the last 3 weeks he has probably ate at restaurants every day which is unusual.  She normally cooks at home.  She is aware he should limit salt intake.   Optivol thoracic impedance suggesting normal fluid levels.   Prescribed: No diuretic  Recommendations:  Provided ICM direct number and encouraged to call if patient experiences any fluid symptoms.   Advised to avoid restaurant foods and limit daily salt intake.   Follow-up plan:  ICM clinic phone appointment 07/11/2020 (patient will have device check in office 06/21/20).   91 day device clinic remote transmission 08/02/2020.    EP/Cardiology Office Visits: 06/21/2020 with Dr. Royann Shivers.    Copy of ICM check sent to Dr. Royann Shivers.   3 month ICM trend: 06/05/2020    1 Year ICM trend:       Karie Soda, RN 06/06/2020 2:27 PM

## 2020-06-21 ENCOUNTER — Encounter: Payer: Self-pay | Admitting: Cardiovascular Disease

## 2020-06-21 ENCOUNTER — Other Ambulatory Visit: Payer: Self-pay

## 2020-06-21 ENCOUNTER — Ambulatory Visit (INDEPENDENT_AMBULATORY_CARE_PROVIDER_SITE_OTHER): Payer: Medicare Other | Admitting: Cardiovascular Disease

## 2020-06-21 VITALS — BP 122/74 | HR 71 | Ht 76.0 in | Wt 257.0 lb

## 2020-06-21 DIAGNOSIS — E1169 Type 2 diabetes mellitus with other specified complication: Secondary | ICD-10-CM

## 2020-06-21 DIAGNOSIS — I2510 Atherosclerotic heart disease of native coronary artery without angina pectoris: Secondary | ICD-10-CM

## 2020-06-21 DIAGNOSIS — I5022 Chronic systolic (congestive) heart failure: Secondary | ICD-10-CM | POA: Diagnosis not present

## 2020-06-21 DIAGNOSIS — I10 Essential (primary) hypertension: Secondary | ICD-10-CM | POA: Diagnosis not present

## 2020-06-21 DIAGNOSIS — E669 Obesity, unspecified: Secondary | ICD-10-CM

## 2020-06-21 DIAGNOSIS — Z95810 Presence of automatic (implantable) cardiac defibrillator: Secondary | ICD-10-CM

## 2020-06-21 DIAGNOSIS — E7800 Pure hypercholesterolemia, unspecified: Secondary | ICD-10-CM

## 2020-06-21 NOTE — Progress Notes (Signed)
Cardiology Office Note:    Date:  06/26/2020   ID:  Kevin Patel, DOB September 13, 1945, MRN [id number]  PCP:  Kevin Payment., MD  Cardiologist:  [name], MD   Referring MD: Kevin Payment., MD   Chief Complaint  Patient presents with  . Follow-up  : f/u ICD   History of Present Illness:    Kevin Patel is a 75 y.o. 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 Previously reported LV apical thrombus has not been seen on his recent echos since 2012 or his recent LV angiogram. He has a single lead Medtronic VISIA AF device, MRI conditional.  The lead is a Medtronic [id number].  He has done well from a cardiovascular point of view over the last year.  He denies any problems with dyspnea.  He was briefly hospitalized for COVID-19 infection and acute kidney injury in August, treated with remdesivir and steroids, without respiratory difficulties.  He does feel that he is lost some of his muscular strength since his COVID-19 infection in August.  In November, he had problems with sudden loss of hearing in his left ear and swelling in his legs, his device showing a transient increase in the OptiVol at the same time.  His leg edema and OptiVol deviation resolved spontaneously, but he has not regained his hearing.  He does not have orthopnea, PND, chest discomfort, palpitations, dizziness or syncope.  He continues to enjoy bowling, but his knees are slowing him down and he is considering bilateral total knee replacement.  Device interrogation shows normal function.  The estimated generator longevity is 9.3 years. He's had a single NSVT in recent past (Jun 19, 2020; asymptomatic 7 beats).  No recent episodes of atrial fibrillation.  OptiVol is currently in normal range. No need for V pacing.  Past Medical History:  Diagnosis Date  . AICD (automatic  cardioverter/defibrillator) present 02/12/2017  . Chronic systolic HF (heart failure) (HCC)   . Coronary artery disease   . Diabetes mellitus with coincident hypertension (HCC) 09/09/2016  . History of kidney stones   . HTN (hypertension)   . Hyperlipidemia   . Ischemic cardiomyopathy   . MI (myocardial infarction) (HCC) 1998   myoview 09/25/2006-no ischemia, EF 46%  . Mural thrombus of heart    on coumadin until follow up echo with contrast in Sept 13, 2012 which showed no thrombus and coumadin was d/c    Past Surgical History:  Procedure Laterality Date  . CORONARY ANGIOPLASTY  1998   PCI  . CORONARY STENT INTERVENTION N/A 09/05/2016   Procedure: Coronary Stent Intervention;  Surgeon: [name], MD;  Location: [facility] INVASIVE CV LAB;  Service: Cardiovascular;  Laterality: N/A;  . ICD IMPLANT N/A 02/12/2017   Procedure: ICD Implant;  Surgeon: [name], MD;  Location: [facility] INVASIVE CV LAB;  Service: Cardiovascular;  Laterality: N/A;  . INTRAVASCULAR ULTRASOUND/IVUS N/A 09/05/2016   Procedure: Intravascular Ultrasound/IVUS;  Surgeon: [name], MD;  Location: [facility] INVASIVE CV LAB;  Service: Cardiovascular;  Laterality: N/A;  . LEFT HEART CATH AND CORONARY ANGIOGRAPHY N/A 09/05/2016   Procedure: Left Heart Cath and Coronary Angiography;  Surgeon: [name], MD;  Location: [facility] INVASIVE CV LAB;  Service: Cardiovascular;  Laterality: N/A;    Current Medications: Current Meds  Medication Sig  . acetaminophen (TYLENOL) 325 MG tablet  Take 2 tablets (650 mg total) by mouth every 4 (four) hours as needed for headache or mild pain.  Marland Kitchen albuterol (VENTOLIN HFA) 108 (90 Base) MCG/ACT inhaler Inhale 2 puffs into the lungs every 6 (six) hours as needed for wheezing or shortness of breath.  Marland Kitchen amLODipine (NORVASC) 10 MG tablet Take 1 tablet (10 mg total) by mouth daily.  Marland Kitchen aspirin 81 MG tablet Take 81 mg by mouth every morning.   Marland Kitchen atorvastatin (LIPITOR) 80 MG tablet  Take 1 tablet by mouth once daily (Patient taking differently: Take 80 mg by mouth daily.)  . carvedilol (COREG) 12.5 MG tablet Take 1 tablet by mouth twice daily (Patient taking differently: Take 12.5 mg by mouth 2 (two) times daily with a meal.)  . co-enzyme Q-10 30 MG capsule Take 30 mg by mouth daily.   . Glucos-Chondroit-Hyaluron-MSM (GLUCOSAMINE CHONDROITIN JOINT PO) Take 1 tablet by mouth daily.  Marland Kitchen losartan-hydrochlorothiazide (HYZAAR) 100-12.5 MG tablet Take 1 tablet by mouth once daily (Patient taking differently: 1 tablet daily.)  . Multiple Vitamin (MULTIVITAMIN) capsule Take 1 capsule by mouth daily.  . nitroGLYCERIN (NITROSTAT) 0.4 MG SL tablet Place 0.4 mg under the tongue every 5 (five) minutes as needed for chest pain.   Marland Kitchen omega-3 acid ethyl esters (LOVAZA) 1 g capsule Take 1 g by mouth daily.  . vitamin C (ASCORBIC ACID) 500 MG tablet Take 500 mg by mouth daily.  . vitamin E 1000 UNIT capsule Take 1,000 Units by mouth daily.      Allergies:   Patient has no known allergies.   Social History   Socioeconomic History  . Marital status: Married    Spouse name: Not on file  . Number of children: Not on file  . Years of education: Not on file  . Highest education level: Not on file  Occupational History  . Not on file  Tobacco Use  . Smoking status: Former Smoker    Quit date: 02/12/1993    Years since quitting: 27.3  . Smokeless tobacco: Never Used  Vaping Use  . Vaping Use: Never used  Substance and Sexual Activity  . Alcohol use: No  . Drug use: No  . Sexual activity: Not on file  Other Topics Concern  . Not on file  Social History Narrative  . Not on file   Social Determinants of Health   Financial Resource Strain: Not on file  Food Insecurity: Not on file  Transportation Needs: Not on file  Physical Activity: Not on file  Stress: Not on file  Social Connections: Not on file     Family History: The patient's family history includes Emphysema in his  father. ROS:   Please see the history of present illness.    All other systems are reviewed and are negative.   EKGs/Labs/Other Studies Reviewed:    The following studies were reviewed today: Last echocardiogram and coronary angiograms  EKG:  EKG is ordered today and it shows normal sinus rhythm with a single PVC, left axis deviation and poor R wave progression consistent with old anterior MI, QTC 447 ms.  Recent Labs: 01/29/2020: ALT 23; B Natriuretic Peptide 1,417.1; BUN 32; Creatinine, Ser 1.25; Hemoglobin 13.4; Platelets 188; Potassium 3.7; Sodium 141 01/31/2020: Magnesium 1.6  06/03/2019 Hemoglobin A1c 6.3% Total cholesterol 125, triglycerides 123, HDL 46, LDL 65 Glucose 139, creatinine 1.15, normal liver function test, potassium 3.7, hemoglobin 14.1  01/29/2020 Hemoglobin 13.4, creatinine 1.25, potassium 3.7, triglycerides 92 Recent Lipid Panel  Component Value Date/Time   CHOL 119 07/22/2018 1104   TRIG 92 01/26/2020 2028   HDL 40 07/22/2018 1104   CHOLHDL 3.0 07/22/2018 1104   CHOLHDL 2.8 09/16/2016 0949   VLDL 22 09/16/2016 0949   LDLCALC 55 07/22/2018 1104   06/07/2020 Total cholesterol 156, triglycerides 139, HDL 61, LDL 87, A1c 8.1%, creatinine 1.18, hemoglobin 13.8  Physical Exam:    VS:  BP 122/74 (BP Location: Left Arm, Patient Position: Sitting, Cuff Size: Normal)   Pulse 71   Ht 6\' 4"  (1.93 m)   Wt 257 lb (116.6 kg)   BMI 31.28 kg/m     Wt Readings from Last 3 Encounters:  06/21/20 257 lb (116.6 kg)  01/28/20 215 lb 3.2 oz (97.6 kg)  08/09/19 277 lb 3.2 oz (125.7 kg)      General: Alert, oriented x3, no distress, mildly obese, but also appears fairly strong and fit for his age. Head: no evidence of trauma, PERRL, EOMI, no exophtalmos or lid lag, no myxedema, no xanthelasma; normal ears, nose and oropharynx Neck: normal jugular venous pulsations and no hepatojugular reflux; brisk carotid pulses without delay and no carotid bruits Chest: clear to  auscultation, no signs of consolidation by percussion or palpation, normal fremitus, symmetrical and full respiratory excursions Cardiovascular: normal position and quality of the apical impulse, regular rhythm, normal first and second heart sounds, no murmurs, rubs or gallops Abdomen: no tenderness or distention, no masses by palpation, no abnormal pulsatility or arterial bruits, normal bowel sounds, no hepatosplenomegaly Extremities: no clubbing, cyanosis or edema; 2+ radial, ulnar and brachial pulses bilaterally; 2+ right femoral, posterior tibial and dorsalis pedis pulses; 2+ left femoral, posterior tibial and dorsalis pedis pulses; no subclavian or femoral bruits Neurological: grossly nonfocal Psych: Normal mood and affect   ASSESSMENT:    1. Chronic systolic heart failure (HCC)   2. Coronary artery disease involving native coronary artery of native heart without angina pectoris   3. Essential hypertension   4. Diabetes mellitus type 2 in obese (HCC)   5. Hypercholesterolemia   6. ICD (implantable cardioverter-defibrillator) in place   7. Mild obesity    PLAN:    In order of problems listed above:  1. CHF: Continues to have excellent functional status and remains euvolemic without need for loop diuretics.  Have offered escalation of therapy to [facility], but he declined last year and after his recent episode of acute renal dysfunction I feel less eager to push this.  On carvedilol, losartan. 2. CAD: s/p anterior MI 1998, s/p LAD-DES 09/05/2016.  Asymptomatic, no angina pectoris.  On aspirin and beta-blocker. 3. HTN: Good control 4. DM: Most recent hemoglobin A1c 8.1%, up from 6.6% a year ago.  He has gained a lot of weight.  It is possible that his knee problems are preventing him from being as physically active.  Also received steroids in August for his COVID-19 infection (although the impact from this should have waned by the time he had his labs in December.).  Weight loss would be  beneficial. 5. HLP: All lipid parameters in target range.  Her year ago his LDL cholesterol was only 55, but has increased to 89 on labs performed last month. I suspect this is due due to the deterioration in glycemic control, but it may also be due to to less than perfect compliance with statin prescription.  Encouraged him to take the statin daily.  Target LDL less than 70. 6. ICD: Function.  Continue remote downloads every  3 months. 7. Mild obesity  Medication Adjustments/Labs and Tests Ordered: Current medicines are reviewed at length with the patient today.  Concerns regarding medicines are outlined above.  Orders Placed This Encounter  Procedures  . EKG 12-Lead   No orders of the defined types were placed in this encounter.   Signed, [name], MD  06/26/2020 3:48 PM    [facility]

## 2020-06-21 NOTE — Patient Instructions (Signed)

## 2020-07-11 ENCOUNTER — Ambulatory Visit (INDEPENDENT_AMBULATORY_CARE_PROVIDER_SITE_OTHER): Payer: Medicare Other

## 2020-07-11 DIAGNOSIS — I5022 Chronic systolic (congestive) heart failure: Secondary | ICD-10-CM

## 2020-07-11 DIAGNOSIS — Z95810 Presence of automatic (implantable) cardiac defibrillator: Secondary | ICD-10-CM | POA: Diagnosis not present

## 2020-07-11 NOTE — Progress Notes (Signed)
EPIC Encounter for ICM Monitoring  Patient Name: Kevin Patel is a 75 y.o. male Date: 07/11/2020 Primary Care Physican: [name]., MD Primary Cardiologist:Croitoru Electrophysiologist:Croitoru 06/21/2020 Office Weight: 257 lbs   Spoke with wife per DPR.  She reports patient is feeling fine and he has not experienced any fluid symptoms.  He remains active with bowling 3 times a week.  She said he does not follow a low salt diet. He eats foods high salt such as potato chips and also uses salt shaker at the table. Explained a shake of the salt shaker results in about 250 mg per shake.  She will try to remove the salt shaker from home and encourage him to stay away from salty foods.   Encouraged to limit salt intake to 2000 mg daily.   Optivol thoracic impedancesuggesting possible fluid accumulation since 07/04/2020.  Prescribed:No diuretic  Recommendations: Encouraged to call if patient experiences any fluid symptoms.   Advised to limit salt intake to 2000 mg daily.  Follow-up plan: ICM clinic phone appointment 07/24/2020 to recheck fluid levels. 91 day device clinic remote transmission 08/02/2020.   EP/Cardiology Office Visits: Recall 1/12/2023with Dr. Royann Shivers.   Copy of ICM check sent to Dr.Croitoru.  3 month ICM trend: 07/11/2020.    1 Year ICM trend:       Karie Soda, RN 07/11/2020 4:02 PM

## 2020-07-11 NOTE — Progress Notes (Signed)
Thanks. Maybe he'll do better with salt this week and we can avoid diuretic

## 2020-07-24 ENCOUNTER — Ambulatory Visit (INDEPENDENT_AMBULATORY_CARE_PROVIDER_SITE_OTHER): Payer: Medicare Other

## 2020-07-24 DIAGNOSIS — I5022 Chronic systolic (congestive) heart failure: Secondary | ICD-10-CM

## 2020-07-24 DIAGNOSIS — Z95810 Presence of automatic (implantable) cardiac defibrillator: Secondary | ICD-10-CM

## 2020-08-01 NOTE — Progress Notes (Signed)
EPIC Encounter for ICM Monitoring  Patient Name: Kevin Patel is a 75 y.o. male Date: 08/01/2020 Primary Care Physican: [name]., MD Primary Cardiologist:Croitoru Electrophysiologist:Croitoru 06/21/2020 Office Weight: 257 lbs   Transmission reviewed.  Optivol thoracic impedancesuggesting fluid levels returned to normal.  Prescribed:No diuretic  Recommendations:No changes  Follow-up plan: ICM clinic phone appointment3/21/2022. 91 day device clinic remote transmission 08/02/2020.   EP/Cardiology Office Visits: Recall 1/12/2023with Dr. Royann Shivers.   Copy of ICM check sent to Dr.Croitoru.  3 month ICM trend: 07/24/2020.    1 Year ICM trend:       Karie Soda, RN 08/01/2020 9:01 AM

## 2020-08-02 ENCOUNTER — Ambulatory Visit (INDEPENDENT_AMBULATORY_CARE_PROVIDER_SITE_OTHER): Payer: Medicare Other

## 2020-08-02 DIAGNOSIS — I255 Ischemic cardiomyopathy: Secondary | ICD-10-CM

## 2020-08-03 LAB — CUP PACEART REMOTE DEVICE CHECK
Battery Remaining Longevity: 107 mo
Battery Voltage: 3.01 V
Brady Statistic RV Percent Paced: 0.01 %
Date Time Interrogation Session: 20220223044222
HighPow Impedance: 67 Ohm
Implantable Lead Implant Date: 20180905
Implantable Lead Location: 753860
Implantable Pulse Generator Implant Date: 20180905
Lead Channel Impedance Value: 342 Ohm
Lead Channel Impedance Value: 399 Ohm
Lead Channel Pacing Threshold Amplitude: 0.625 V
Lead Channel Pacing Threshold Pulse Width: 0.4 ms
Lead Channel Sensing Intrinsic Amplitude: 10.875 mV
Lead Channel Sensing Intrinsic Amplitude: 10.875 mV
Lead Channel Setting Pacing Amplitude: 2.5 V
Lead Channel Setting Pacing Pulse Width: 0.4 ms
Lead Channel Setting Sensing Sensitivity: 0.3 mV

## 2020-08-10 NOTE — Progress Notes (Signed)
Remote ICD transmission.   

## 2020-08-14 ENCOUNTER — Other Ambulatory Visit: Payer: Self-pay | Admitting: Cardiovascular Disease

## 2020-08-28 ENCOUNTER — Ambulatory Visit (INDEPENDENT_AMBULATORY_CARE_PROVIDER_SITE_OTHER): Payer: Medicare Other

## 2020-08-28 DIAGNOSIS — Z95810 Presence of automatic (implantable) cardiac defibrillator: Secondary | ICD-10-CM

## 2020-08-28 DIAGNOSIS — I5022 Chronic systolic (congestive) heart failure: Secondary | ICD-10-CM

## 2020-09-01 ENCOUNTER — Telehealth: Payer: Self-pay

## 2020-09-01 NOTE — Progress Notes (Signed)
EPIC Encounter for ICM Monitoring  Patient Name: Kevin Patel is a 75 y.o. male Date: 09/01/2020 Primary Care Physican: [name]., MD Primary Cardiologist:Croitoru Electrophysiologist:Croitoru 06/21/2020 OfficeWeight: 257 lbs   Attempted call to patient and unable to reach.   Transmission reviewed.   Optivol thoracic impedancesuggestingnormal fluid levels.  Prescribed:No diuretic  Recommendations:Unable to reach.    Follow-up plan: ICM clinic phone appointment 10/02/2020. 91 day device clinic remote transmission 11/01/2020.   EP/Cardiology Office Visits: Recall1/12/2023with Dr. Royann Shivers.   Copy of ICM check sent to Dr.Croitoru  3 month ICM trend: 08/28/2020.    1 Year ICM trend:       Karie Soda, RN 09/01/2020 9:52 AM

## 2020-09-01 NOTE — Telephone Encounter (Signed)
Remote ICM transmission received.  Attempted call to patient regarding ICM remote transmission and no answer or voice mail option.  

## 2020-10-04 ENCOUNTER — Other Ambulatory Visit: Payer: Self-pay | Admitting: Cardiovascular Disease

## 2020-10-06 NOTE — Progress Notes (Signed)
No ICM remote transmission received for 10/02/2020 and next ICM transmission scheduled for 10/16/2020.   

## 2020-10-16 ENCOUNTER — Ambulatory Visit (INDEPENDENT_AMBULATORY_CARE_PROVIDER_SITE_OTHER): Payer: Medicare Other

## 2020-10-16 DIAGNOSIS — I5022 Chronic systolic (congestive) heart failure: Secondary | ICD-10-CM | POA: Diagnosis not present

## 2020-10-16 DIAGNOSIS — Z95810 Presence of automatic (implantable) cardiac defibrillator: Secondary | ICD-10-CM

## 2020-10-17 NOTE — Progress Notes (Signed)
EPIC Encounter for ICM Monitoring  Patient Name: Kevin Patel is a 75 y.o. male Date: 10/17/2020 Primary Care Physican: [name]., MD Primary Cardiologist:Croitoru Electrophysiologist:Croitoru 09/14/2020 Office Weight: 263 lbs   Spoke with patient and reports feeling well at this time.  Denies fluid symptoms.    Optivol thoracic impedancesuggestingnormal fluid levels.  Prescribed:No diuretic  Recommendations:No changes and encouraged to call if experiencing any fluid symptoms.  Follow-up plan: ICM clinic phone appointment 11/27/2020. 91 day device clinic remote transmission 11/01/2020.   EP/Cardiology Office Visits: Recall1/12/2023with Dr. Royann Shivers.   Copy of ICM check sent to Dr.Croitoru  3 month ICM trend: 10/16/2020.    1 Year ICM trend:       Karie Soda, RN 10/17/2020 8:39 AM

## 2020-10-17 NOTE — Progress Notes (Signed)
Looks good, thank you.

## 2020-11-01 ENCOUNTER — Ambulatory Visit (INDEPENDENT_AMBULATORY_CARE_PROVIDER_SITE_OTHER): Payer: Medicare Other

## 2020-11-01 DIAGNOSIS — I255 Ischemic cardiomyopathy: Secondary | ICD-10-CM

## 2020-11-01 DIAGNOSIS — I5022 Chronic systolic (congestive) heart failure: Secondary | ICD-10-CM | POA: Diagnosis not present

## 2020-11-02 LAB — CUP PACEART REMOTE DEVICE CHECK
Battery Remaining Longevity: 102 mo
Battery Voltage: 3 V
Brady Statistic RV Percent Paced: 0.01 %
Date Time Interrogation Session: 20220525012503
HighPow Impedance: 72 Ohm
Implantable Lead Implant Date: 20180905
Implantable Lead Location: 753860
Implantable Pulse Generator Implant Date: 20180905
Lead Channel Impedance Value: 342 Ohm
Lead Channel Impedance Value: 399 Ohm
Lead Channel Pacing Threshold Amplitude: 0.5 V
Lead Channel Pacing Threshold Pulse Width: 0.4 ms
Lead Channel Sensing Intrinsic Amplitude: 10.25 mV
Lead Channel Sensing Intrinsic Amplitude: 10.25 mV
Lead Channel Setting Pacing Amplitude: 2.5 V
Lead Channel Setting Pacing Pulse Width: 0.4 ms
Lead Channel Setting Sensing Sensitivity: 0.3 mV

## 2020-11-13 ENCOUNTER — Other Ambulatory Visit: Payer: Self-pay | Admitting: Cardiovascular Disease

## 2020-11-23 NOTE — Progress Notes (Signed)
Remote ICD transmission.   

## 2020-11-27 ENCOUNTER — Ambulatory Visit (INDEPENDENT_AMBULATORY_CARE_PROVIDER_SITE_OTHER): Payer: Medicare Other

## 2020-11-27 DIAGNOSIS — Z95810 Presence of automatic (implantable) cardiac defibrillator: Secondary | ICD-10-CM | POA: Diagnosis not present

## 2020-11-27 DIAGNOSIS — I5022 Chronic systolic (congestive) heart failure: Secondary | ICD-10-CM | POA: Diagnosis not present

## 2020-11-29 ENCOUNTER — Telehealth: Payer: Self-pay

## 2020-11-29 NOTE — Telephone Encounter (Signed)
Remote ICM transmission received.  Attempted call to patient regarding ICM remote transmission and no answer or voice mail option.  

## 2020-11-29 NOTE — Progress Notes (Signed)
EPIC Encounter for ICM Monitoring  Patient Name: Kevin Patel is a 75 y.o. male Date: 11/29/2020 Primary Care Physican: [name]., MD Primary Cardiologist: Croitoru Electrophysiologist: Croitoru 09/14/2020 Office Weight:  263 lbs                                                            Attempted call to patient and unable to reach.   Transmission reviewed.    Optivol thoracic impedance suggesting normal fluid levels.    Prescribed: No diuretic   Recommendations:  Unable to reach.     Follow-up plan:  ICM clinic phone appointment 01/01/2021.   91 day device clinic remote transmission 11/01/2020.     EP/Cardiology Office Visits:  Recall 06/21/2021 with Dr. Royann Shivers.     Copy of ICM check sent to Dr. Royann Shivers  3 month ICM trend: 11/27/2020.    1 Year ICM trend:       Karie Soda, RN 11/29/2020 11:25 AM

## 2020-12-06 ENCOUNTER — Ambulatory Visit (INDEPENDENT_AMBULATORY_CARE_PROVIDER_SITE_OTHER): Payer: Medicare Other | Admitting: Cardiovascular Disease

## 2020-12-06 ENCOUNTER — Other Ambulatory Visit: Payer: Self-pay

## 2020-12-06 ENCOUNTER — Telehealth: Payer: Self-pay

## 2020-12-06 ENCOUNTER — Encounter: Payer: Self-pay | Admitting: Cardiovascular Disease

## 2020-12-06 VITALS — BP 132/60 | HR 70 | Ht 76.0 in | Wt 241.0 lb

## 2020-12-06 DIAGNOSIS — I255 Ischemic cardiomyopathy: Secondary | ICD-10-CM | POA: Diagnosis not present

## 2020-12-06 DIAGNOSIS — I2510 Atherosclerotic heart disease of native coronary artery without angina pectoris: Secondary | ICD-10-CM | POA: Diagnosis not present

## 2020-12-06 DIAGNOSIS — E1169 Type 2 diabetes mellitus with other specified complication: Secondary | ICD-10-CM

## 2020-12-06 DIAGNOSIS — I4902 Ventricular flutter: Secondary | ICD-10-CM

## 2020-12-06 DIAGNOSIS — I5022 Chronic systolic (congestive) heart failure: Secondary | ICD-10-CM

## 2020-12-06 DIAGNOSIS — Z95810 Presence of automatic (implantable) cardiac defibrillator: Secondary | ICD-10-CM

## 2020-12-06 DIAGNOSIS — I10 Essential (primary) hypertension: Secondary | ICD-10-CM | POA: Diagnosis not present

## 2020-12-06 DIAGNOSIS — E669 Obesity, unspecified: Secondary | ICD-10-CM

## 2020-12-06 DIAGNOSIS — E7800 Pure hypercholesterolemia, unspecified: Secondary | ICD-10-CM

## 2020-12-06 DIAGNOSIS — E663 Overweight: Secondary | ICD-10-CM

## 2020-12-06 LAB — PACEMAKER DEVICE OBSERVATION

## 2020-12-06 NOTE — Patient Instructions (Addendum)
Medication Instructions:  No changes *If you need a refill on your cardiac medications before your next appointment, please call your pharmacy*   Lab Work: Your provider would like for you to have the following labs today: CMET, Magnesium, and BNP  If you have labs (blood work) drawn today and your tests are completely normal, you will receive your results only by: MyChart Message (if you have MyChart) OR A paper copy in the mail If you have any lab test that is abnormal or we need to change your treatment, we will call you to review the results.   Testing/Procedures: Your physician has requested that you have a lexiscan myoview. For further information please visit [email or web address] Please follow instruction sheet, as given. This will take place at [address], [address]  How to prepare for your Myocardial Perfusion Test: Do not eat or drink 3 hours prior to your test, except you may have water. Do not consume products containing caffeine (regular or decaffeinated) 12 hours prior to your test. (ex: coffee, chocolate, sodas, tea). Do bring a list of your current medications with you.  If not listed below, you may take your medications as normal. Do wear comfortable clothes (no dresses or overalls) and walking shoes, tennis shoes preferred (No heels or open toe shoes are allowed). Do NOT wear cologne, perfume, aftershave, or lotions (deodorant is allowed). The test will take approximately 3 to 4 hours to complete If these instructions are not followed, your test will have to be rescheduled.   Your physician has requested that you have an echocardiogram. Echocardiography is a painless test that uses sound waves to create images of your heart. It provides your doctor with information about the size and shape of your heart and how well your heart's chambers and valves are working. You may receive an ultrasound enhancing agent through an IV if needed to better visualize your heart  during the echo.This procedure takes approximately one hour. There are no restrictions for this procedure. This will take place at the [address], [address].    Follow-Up: At [facility], you and your health needs are our priority.  As part of our continuing mission to provide you with exceptional heart care, we have created designated Provider Care Teams.  These Care Teams include your primary Cardiologist (physician) and Advanced Practice Providers (APPs -  Physician Assistants and Nurse Practitioners) who all work together to provide you with the care you need, when you need it.  We recommend signing up for the patient portal called "MyChart".  Sign up information is provided on this After Visit Summary.  MyChart is used to connect with patients for Virtual Visits (Telemedicine).  Patients are able to view lab/test results, encounter notes, upcoming appointments, etc.  Non-urgent messages can be sent to your provider as well.   To learn more about what you can do with MyChart, go to [email or web address].    Your next appointment:   Keep your follow up with Dr. Royann Shivers on 01/22/21 at 11:40 am.

## 2020-12-06 NOTE — Progress Notes (Signed)
Cardiology Office Note:    Date:  12/06/2020   ID:  [name], DOB Jun 28, 1945, MRN [id number]  PCP:  [name]., MD  Cardiologist:  [name], MD   Referring MD: [name]., MD   No chief complaint on file. : f/u ICD   History of Present Illness:    Kevin Patel is a 75 y.o. 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 Previously reported LV apical thrombus has not been seen on his recent echos since 2012 or his recent LV angiogram. He has a single lead Medtronic VISIA AF device, MRI conditional.  The lead is a Medtronic [id number].  He had an appropriate ICD shock last night around 1 AM. He had abrupt onset of ventricular flutter with a cycle length of 210 ms. The ICD delivered a single shock with conversion to normal rhythm. The episode lasted 17 seconds in total. He was asleep and apparently completely unaware of the event.  Device function is normal, all lead and generator parameters are normal.  He has not had recent angina or dyspnea or edema. Unaware of any palpitations, although he is having very frequent PVCs today on exam and on ICD check. He is confident he has not missed any cardiac meds and has faithfully taken his beta blocker twice daily. Kevin Patel has been very stable. He denies intercurrent respiratory or febrile illness. He is a little worried about the health problems of his wife and grandson, currently both hospitalized.   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   Chronic systolic HF (heart failure) (HCC)    Coronary artery disease    Diabetes mellitus with coincident hypertension (HCC) 09/09/2016   History of kidney stones    HTN (hypertension)    Hyperlipidemia    Ischemic cardiomyopathy    MI (myocardial infarction) (HCC) 1998   myoview  09/25/2006-no ischemia, EF 46%   [place] thrombus of heart    on coumadin until follow up echo with contrast in Sept 13, 2012 which showed no thrombus and coumadin was d/c    Past Surgical History:  Procedure Laterality Date   CORONARY ANGIOPLASTY  1998   PCI   CORONARY STENT INTERVENTION N/A 09/05/2016   Procedure: Coronary Stent Intervention;  Surgeon: [name], MD;  Location: [facility] INVASIVE CV LAB;  Service: Cardiovascular;  Laterality: N/A;   ICD IMPLANT N/A 02/12/2017   Procedure: ICD Implant;  Surgeon: [name], MD;  Location: [facility] INVASIVE CV LAB;  Service: Cardiovascular;  Laterality: N/A;   INTRAVASCULAR ULTRASOUND/IVUS N/A 09/05/2016   Procedure: Intravascular Ultrasound/IVUS;  Surgeon: [name], MD;  Location: [facility] INVASIVE CV LAB;  Service: Cardiovascular;  Laterality: N/A;   LEFT HEART CATH AND CORONARY ANGIOGRAPHY N/A 09/05/2016   Procedure: Left Heart Cath and Coronary Angiography;  Surgeon: [name], MD;  Location: [facility] INVASIVE CV LAB;  Service: Cardiovascular;  Laterality: N/A;    Current Medications: Current Meds  Medication Sig   acetaminophen (TYLENOL) 325 MG tablet Take 2 tablets (650 mg total) by mouth every 4 (four) hours as needed for headache or mild pain.   albuterol (VENTOLIN HFA) 108 (90 Base) MCG/ACT inhaler Inhale 2 puffs into the lungs every 6 (six) hours as needed for wheezing or shortness of breath.   amLODipine (NORVASC) 10 MG tablet  Take 1 tablet (10 mg total) by mouth daily.   aspirin 81 MG tablet Take 81 mg by mouth every morning.    atorvastatin (LIPITOR) 80 MG tablet Take 1 tablet by mouth once daily   carvedilol (COREG) 12.5 MG tablet Take 1 tablet (12.5 mg total) by mouth 2 (two) times daily with a meal.   co-enzyme Q-10 30 MG capsule Take 30 mg by mouth daily.    Glucos-Chondroit-Hyaluron-MSM (GLUCOSAMINE CHONDROITIN JOINT PO) Take 1 tablet by mouth daily.   losartan-hydrochlorothiazide (HYZAAR) 100-12.5 MG  tablet Take 1 tablet by mouth once daily (Patient taking differently: 1 tablet daily.)   Multiple Vitamin (MULTIVITAMIN) capsule Take 1 capsule by mouth daily.   nitroGLYCERIN (NITROSTAT) 0.4 MG SL tablet Place 0.4 mg under the tongue every 5 (five) minutes as needed for chest pain.    omega-3 acid ethyl esters (LOVAZA) 1 g capsule Take 1 g by mouth daily.   vitamin C (ASCORBIC ACID) 500 MG tablet Take 500 mg by mouth daily.   vitamin E 1000 UNIT capsule Take 1,000 Units by mouth daily.      Allergies:   Patient has no known allergies.   Social History   Socioeconomic History   Marital status: Married    Spouse name: Not on file   Number of children: Not on file   Years of education: Not on file   Highest education level: Not on file  Occupational History   Not on file  Tobacco Use   Smoking status: Former    Pack years: 0.00    Types: Cigarettes    Quit date: 02/12/1993    Years since quitting: 27.8   Smokeless tobacco: Never  Vaping Use   Vaping Use: Never used  Substance and Sexual Activity   Alcohol use: No   Drug use: No   Sexual activity: Not on file  Other Topics Concern   Not on file  Social History Narrative   Not on file   Social Determinants of Health   Financial Resource Strain: Not on file  Food Insecurity: Not on file  Transportation Needs: Not on file  Physical Activity: Not on file  Stress: Not on file  Social Connections: Not on file     Family History: The patient's family history includes Emphysema in his father. ROS:   Please see the history of present illness.    All other systems are reviewed and are negative.  EKGs/Labs/Other Studies Reviewed:    The following studies were reviewed today: Last echocardiogram and coronary angiograms Comprehensive check of his ICD today.  EKG:  EKG is ordered today and it shows normal sinus rhythm and frequent monomorphic PVCs, old ST depression in I and aVL, preexisting poor R wave progression consistent  with old anterior MI. QTc 447 ms.  Recent Labs: 01/29/2020: ALT 23; B Natriuretic Peptide 1,417.1; BUN 32; Creatinine, Ser 1.25; Hemoglobin 13.4; Platelets 188; Potassium 3.7; Sodium 141 01/31/2020: Magnesium 1.6  06/03/2019 Hemoglobin A1c 6.3% Total cholesterol 125, triglycerides 123, HDL 46, LDL 65 Glucose 139, creatinine 1.15, normal liver function test, potassium 3.7, hemoglobin 14.1  01/29/2020 Hemoglobin 13.4, creatinine 1.25, potassium 3.7, triglycerides 92 Recent Lipid Panel    Component Value Date/Time   CHOL 119 07/22/2018 1104   TRIG 92 01/26/2020 2028   HDL 40 07/22/2018 1104   CHOLHDL 3.0 07/22/2018 1104   CHOLHDL 2.8 09/16/2016 0949   VLDL 22 09/16/2016 0949   LDLCALC 55 07/22/2018 1104   06/07/2020 Total cholesterol 156, triglycerides  139, HDL 61, LDL 87, A1c 8.1%, creatinine 1.18, hemoglobin 13.8  Physical Exam:    VS:  BP 132/60   Pulse 70   Ht 6\' 4"  (1.93 m)   Wt 241 lb (109.3 kg)   SpO2 97%   BMI 29.34 kg/m     Wt Readings from Last 3 Encounters:  12/06/20 241 lb (109.3 kg)  06/21/20 257 lb (116.6 kg)  01/28/20 215 lb 3.2 oz (97.6 kg)      General: Alert, oriented x3, no distress, healthy ICD site Head: no evidence of trauma, PERRL, EOMI, no exophtalmos or lid lag, no myxedema, no xanthelasma; normal ears, nose and oropharynx Neck: normal jugular venous pulsations and no hepatojugular reflux; brisk carotid pulses without delay and no carotid bruits Chest: clear to auscultation, no signs of consolidation by percussion or palpation, normal fremitus, symmetrical and full respiratory excursions Cardiovascular: normal position and quality of the apical impulse, frequent ectopic beats, normal first and second heart sounds, no murmurs, rubs or gallops Abdomen: no tenderness or distention, no masses by palpation, no abnormal pulsatility or arterial bruits, normal bowel sounds, no hepatosplenomegaly Extremities: no clubbing, cyanosis or edema; 2+ radial, ulnar  and brachial pulses bilaterally; 2+ right femoral, posterior tibial and dorsalis pedis pulses; 2+ left femoral, posterior tibial and dorsalis pedis pulses; no subclavian or femoral bruits Neurological: grossly nonfocal Psych: Normal mood and affect    ASSESSMENT:    1. Ventricular flutter (HCC)   2. Chronic systolic heart failure (HCC)   3. Coronary artery disease involving native coronary artery of native heart without angina pectoris   4. Essential hypertension   5. Diabetes mellitus type 2 in obese (HCC)   6. Hypercholesterolemia   7. ICD (implantable cardioverter-defibrillator) in place   8. Overweight     PLAN:    In order of problems listed above:  Ventricular flutter:  looked very fast and dangerous. Suspect that he was severely hypotensive before shock (and asleep) which is why he was asymptomatic. Check for new ischemia or change in EF with Lexiscan Myoview and echo, but no clinical indication of this. Ventricular electrogram is monomorphic and VFL onset not related to a long-short sequence. Check labs. If no casual electrolyte abnormalities, will plan to increase the carvedilol dose. After work-up completed, consult EP for discussion of antiarrhythmics. Advised no driving for 6 months. CHF: euvolemic clinically and by Optivol, NYHA class I. Will once again encourage transition from losartan to [facility]. Will probably add spironolactone as well, especially if K is low or low normal. SGLT2 inh also with dual indication for DM and CHF. CAD: s/p anterior MI 1998, s/p LAD-DES 09/05/2016.  Asymptomatic, no angina pectoris.  On aspirin and beta-blocker. Check perfusion study due to arrhythmic event, but the V Flutter was more suggestive of scar-related arrhythmia HTN: Good control. Should be room for Entresto, higher dose carvedilol and/or spironolactone. DM: Improving A1c, down to 7.5% from 8.1 %. HLP: excellent lipid profile, continue same meds. ICD: normal device function.  Appropriate and successful high energy therapy delivered. Overweight: congratulated on weight loss.  Medication Adjustments/Labs and Tests Ordered: Current medicines are reviewed at length with the patient today.  Concerns regarding medicines are outlined above.  Orders Placed This Encounter  Procedures   Comprehensive metabolic panel   Magnesium   Brain natriuretic peptide   Myocardial Perfusion Imaging   EKG 12-Lead   ECHOCARDIOGRAM COMPLETE    No orders of the defined types were placed in this encounter.   Signed,  [name], MD  12/06/2020 6:53 PM    [facility]

## 2020-12-06 NOTE — Telephone Encounter (Signed)
[facility] alert 12/06/20 01:22 (1) FVT in VF episode @ 280's x 17 sec. 1 36 J shock successfully delivered.  Patient reports he was asleep during time of shock. Denies any symptoms. Reports compliance with medications on file. Denies any fluid retention symptoms, reflected appropriately on Optivol.   Shock plan reviewed with patient. Advised per [place] DMV no driving X 6 months. Verbalized understanding.

## 2020-12-06 NOTE — Telephone Encounter (Signed)
We will bring him in for labs and ECG.

## 2020-12-06 NOTE — Telephone Encounter (Signed)
Appointment made for 2:15 today with Dr. Royann Shivers. Patient is aware.

## 2020-12-07 LAB — COMPREHENSIVE METABOLIC PANEL
ALT: 16 IU/L (ref 0–44)
AST: 19 IU/L (ref 0–40)
Albumin/Globulin Ratio: 2 (ref 1.2–2.2)
Albumin: 4.7 g/dL (ref 3.7–4.7)
Alkaline Phosphatase: 74 IU/L (ref 44–121)
BUN/Creatinine Ratio: 15 (ref 10–24)
BUN: 26 mg/dL (ref 8–27)
Bilirubin Total: 0.9 mg/dL (ref 0.0–1.2)
CO2: 25 mmol/L (ref 20–29)
Calcium: 10.2 mg/dL (ref 8.6–10.2)
Chloride: 100 mmol/L (ref 96–106)
Creatinine, Ser: 1.73 mg/dL — ABNORMAL HIGH (ref 0.76–1.27)
Globulin, Total: 2.3 g/dL (ref 1.5–4.5)
Glucose: 144 mg/dL — ABNORMAL HIGH (ref 65–99)
Potassium: 4.5 mmol/L (ref 3.5–5.2)
Sodium: 142 mmol/L (ref 134–144)
Total Protein: 7 g/dL (ref 6.0–8.5)
eGFR: 41 mL/min/{1.73_m2} — ABNORMAL LOW (ref >59)

## 2020-12-07 LAB — MAGNESIUM: Magnesium: 2 mg/dL (ref 1.6–2.3)

## 2020-12-07 LAB — BRAIN NATRIURETIC PEPTIDE: BNP: 469.1 pg/mL — ABNORMAL HIGH (ref 0.0–100.0)

## 2020-12-08 ENCOUNTER — Telehealth: Payer: Self-pay | Admitting: Cardiovascular Disease

## 2020-12-08 MED ORDER — CARVEDILOL 25 MG PO TABS: 25.0000 mg | ORAL_TABLET | Freq: Two times a day (BID) | ORAL | 4 refills | Status: DC

## 2020-12-08 MED ORDER — AMLODIPINE BESYLATE 5 MG PO TABS: 5.0000 mg | ORAL_TABLET | Freq: Every day | ORAL | 4 refills | Status: DC

## 2020-12-08 NOTE — Progress Notes (Signed)
I called [name] to alert him of possible COVID-19 exposure during our clinic visit, since I tested positive for the illness the next morning (I had a negative test on the morning of the appointment). I asked him to monitor for fever, cough, dyspnea, sore throat, etc. And to test for COVID if these occur, call me or PCP to see if additional treatment is warranted.

## 2020-12-08 NOTE — Telephone Encounter (Signed)
Patient states he received a call from Dr. Royann Shivers stating he tested positive for covid and was with the patient yesterday. He states the call was disconnected and would like a call back if there was any more information.

## 2020-12-08 NOTE — Telephone Encounter (Signed)
Patient has been made aware.  He was also advised of his lab results and medication changes.   Labs are pretty good.  Kidney function is not normal, but only slightly worse than baseline.  Electrolytes are fine.  No clear reason why he would have had the arrhythmia.  Please ask him to increase the carvedilol to 25 mg twice a day.  Also ask him to reduce the amlodipine to just 5 mg once a day.  Will wait on results of Myoview and echocardiogram before making further adjustments.

## 2020-12-20 ENCOUNTER — Telehealth: Payer: Self-pay

## 2020-12-20 NOTE — Telephone Encounter (Signed)
Pt is scheduled for myoview and echocardiogram, with possible pending EP evaluation.

## 2020-12-20 NOTE — Telephone Encounter (Signed)
   [facility] Pre-operative Risk Assessment    Patient Name: Kevin Patel  DOB: [birth year] MRN: [id number]   Request for surgical clearance:  What type of surgery is being performed? LET TOTAL KNEE ARTHROPLASTY  When is this surgery scheduled? 01-29-2021  What type of clearance is required (medical clearance vs. Pharmacy clearance to hold med vs. Both)? BOTH  Are there any medications that need to be held prior to surgery and how long? ASA  Practice name and name of physician performing surgery? [facility] DR Gaynelle Arabian ATTNClaiborne Billings   What is the office phone number? [phone]   7.   What is the office fax number? [phone]  8.   Anesthesia type (None, local, MAC, general) ? CHOICE   Waylan Rocher 12/20/2020, 2:53 PM  _________________________________________________________________   (provider comments below)

## 2020-12-22 ENCOUNTER — Other Ambulatory Visit: Payer: Self-pay | Admitting: Cardiovascular Disease

## 2020-12-29 ENCOUNTER — Other Ambulatory Visit: Payer: Self-pay

## 2020-12-29 ENCOUNTER — Ambulatory Visit (HOSPITAL_COMMUNITY): Payer: Medicare Other | Attending: Cardiovascular Disease

## 2020-12-29 DIAGNOSIS — I4902 Ventricular flutter: Secondary | ICD-10-CM | POA: Diagnosis present

## 2020-12-29 LAB — ECHOCARDIOGRAM COMPLETE
Area-P 1/2: 2.56 cm2
S' Lateral: 5.4 cm

## 2020-12-29 MED ORDER — PERFLUTREN LIPID MICROSPHERE
1.0000 mL | INTRAVENOUS | Status: AC | PRN
  Administered 2020-12-29: 2 mL via INTRAVENOUS

## 2020-12-29 NOTE — Telephone Encounter (Signed)
   Name: Kevin Patel  DOB: Oct 20, 1945  MRN: [id number]  Primary Cardiologist: None  Chart reviewed as part of pre-operative protocol coverage. Because of Kevin Patel's past medical history and time since last visit, he will require a follow-up visit in order to better assess preoperative cardiovascular risk.  He had echocardiogram 12/29/20 and has stress test upcoming 01/03/21. He has appointment with Dr. Royann Shivers 01/22/21. Cardiac clearance will be addressed at that time. Appt notes updated.  Will route to [facility] and remove from preop pool.   Alver Sorrow, NP  12/29/2020, 4:43 PM

## 2021-01-01 ENCOUNTER — Ambulatory Visit (INDEPENDENT_AMBULATORY_CARE_PROVIDER_SITE_OTHER): Payer: Medicare Other

## 2021-01-01 DIAGNOSIS — I5022 Chronic systolic (congestive) heart failure: Secondary | ICD-10-CM | POA: Diagnosis not present

## 2021-01-01 DIAGNOSIS — Z95810 Presence of automatic (implantable) cardiac defibrillator: Secondary | ICD-10-CM

## 2021-01-02 ENCOUNTER — Telehealth (HOSPITAL_COMMUNITY): Payer: Self-pay | Admitting: *Deleted

## 2021-01-02 ENCOUNTER — Telehealth: Payer: Self-pay

## 2021-01-02 NOTE — Progress Notes (Signed)
EPIC Encounter for ICM Monitoring  Patient Name: Kevin Patel is a 75 y.o. male Date: 01/02/2021 Primary Care Physican: [name]., MD Primary Cardiologist: Croitoru Electrophysiologist: Croitoru 12/06/2020 Office Weight: 241 lbs                                                            Attempted call to patient and unable to reach.   Transmission reviewed.   Optivol thoracic impedance suggesting normal fluid levels.    Prescribed: No diuretic   Labs: 12/06/2020 Creatinine 1.63, BUN 25, Potassium 3.9, Sodium 142, GFR 44 A complete set of results can be found in Results Review.  Recommendations:  Unable to reach.     Follow-up plan:  ICM clinic phone appointment 02/05/2021.   91 day device clinic remote transmission 01/31/2021.     EP/Cardiology Office Visits:  Recall 06/21/2021 with Dr. Royann Shivers.     Copy of ICM check sent to Dr. Royann Shivers.   3 month ICM trend: 01/01/2021.    1 Year ICM trend:       Karie Soda, RN 01/02/2021 1:36 PM

## 2021-01-02 NOTE — Telephone Encounter (Signed)
Remote ICM transmission received.  Attempted call to patient regarding ICM remote transmission and no answer or voice mail option.  

## 2021-01-02 NOTE — Telephone Encounter (Signed)
Close encounter 

## 2021-01-03 ENCOUNTER — Ambulatory Visit (HOSPITAL_COMMUNITY)
Admission: RE | Admit: 2021-01-03 | Discharge: 2021-01-03 | Disposition: A | Discharge status: Home / Self Care | Payer: Medicare Other | Source: Ambulatory Visit | Attending: Cardiology | Admitting: Cardiology

## 2021-01-03 ENCOUNTER — Other Ambulatory Visit: Payer: Self-pay

## 2021-01-03 DIAGNOSIS — I2510 Atherosclerotic heart disease of native coronary artery without angina pectoris: Secondary | ICD-10-CM | POA: Diagnosis present

## 2021-01-03 DIAGNOSIS — I4902 Ventricular flutter: Secondary | ICD-10-CM

## 2021-01-03 DIAGNOSIS — I5022 Chronic systolic (congestive) heart failure: Secondary | ICD-10-CM | POA: Diagnosis present

## 2021-01-03 LAB — MYOCARDIAL PERFUSION IMAGING
LV dias vol: 346 mL (ref 62–150)
LV sys vol: 256 mL
Peak HR: 72 {beats}/min
Rest HR: 52 {beats}/min
SDS: 0
SRS: 23
SSS: 23
TID: 1.1

## 2021-01-03 MED ORDER — TECHNETIUM TC 99M TETROFOSMIN IV KIT
31.2000 | PACK | Freq: Once | INTRAVENOUS | Status: AC | PRN
  Administered 2021-01-03: 31.2 via INTRAVENOUS
  Filled 2021-01-03: qty 32

## 2021-01-03 MED ORDER — REGADENOSON 0.4 MG/5ML IV SOLN
0.4000 mg | Freq: Once | INTRAVENOUS | Status: AC
  Administered 2021-01-03: 0.4 mg via INTRAVENOUS

## 2021-01-03 MED ORDER — TECHNETIUM TC 99M TETROFOSMIN IV KIT
9.8000 | PACK | Freq: Once | INTRAVENOUS | Status: AC | PRN
  Administered 2021-01-03: 9.8 via INTRAVENOUS
  Filled 2021-01-03: qty 10

## 2021-01-04 ENCOUNTER — Other Ambulatory Visit: Payer: Self-pay | Admitting: *Deleted

## 2021-01-04 MED ORDER — SACUBITRIL-VALSARTAN 49-51 MG PO TABS: 1.0000 | ORAL_TABLET | Freq: Two times a day (BID) | ORAL | 11 refills | Status: DC

## 2021-01-08 ENCOUNTER — Telehealth: Payer: Self-pay

## 2021-01-08 NOTE — Telephone Encounter (Signed)
Patient came into office, reports he cannot afford his Entresto. This RN gave pt patient assistance information for Entresto, gave patient 1 sample bottle of Entresto 49-51mg . Reviewed instructions for assistance. Pt verbalized understanding, all questions/concerns addressed at this time.

## 2021-01-16 ENCOUNTER — Encounter: Payer: Self-pay | Admitting: Cardiovascular Disease

## 2021-01-16 NOTE — Progress Notes (Signed)
PERIOPERATIVE PRESCRIPTION FOR IMPLANTED CARDIAC DEVICE PROGRAMMING  Patient Information: Name:  Kevin Patel  DOB:  [birth year]  MRN:  [id number]    Vonzella Nipple, RN  [facility] Device Planned Procedure:  left total Knee Arthroplasty  Surgeon:   Dr. Bevely Palmer  Date of Procedure:  01/29/21  Cautery will be used.  Position during surgery:     Please send documentation back to:  Lori Liew (Fax # [phone])   Vonzella Nipple, RN  01/16/2021 12:49 PM  Device Information:  Clinic EP Physician:  Dr. Rachelle Hora Croitoru   Device Type:  Defibrillator Manufacturer and Phone #:  Medtronic: [id number] Pacemaker Dependent?:  No. Date of Last Device Check:  01/01/21 Normal Device Function?:  Yes.    Electrophysiologist's Recommendations:  Have magnet available. Provide continuous ECG monitoring when magnet is used or reprogramming is to be performed.  Procedure should not interfere with device function.  No device programming or magnet placement needed.  Per Device Clinic Standing Orders, Linton Ham, RN  1:16 PM 01/16/2021

## 2021-01-16 NOTE — Patient Instructions (Addendum)
DUE TO COVID-19 ONLY ONE VISITOR IS ALLOWED TO COME WITH YOU AND STAY IN THE WAITING ROOM ONLY DURING PRE OP AND PROCEDURE DAY OF SURGERY. THE 2 VISITORS  MAY VISIT WITH YOU AFTER SURGERY IN YOUR PRIVATE ROOM DURING VISITING HOURS ONLY!  YOU NEED TO HAVE A COVID 19 TEST ON_______ @_______ , THIS TEST MUST BE DONE BEFORE SURGERY,                 Kevin Patel     Your procedure is scheduled on: 01/29/21   Report to [facility] Main  Entrance   Report to Short stay at 5:15 AM     Call this number if you have problems the morning of surgery [phone]    BRUSH YOUR TEETH MORNING OF SURGERY AND RINSE YOUR MOUTH OUT, NO CHEWING GUM CANDY OR MINTS.   No food after midnight.    You may have clear liquid until 4:30 AM.    At 4:00 AM drink pre surgery drink.   Nothing by mouth after 4:30 AM.    Take these medicines the morning of surgery with A SIP OF WATER: Carvedilol, Amlodipine, Entresto (sacubitril-valsartan) Use your inhaler and bring it with you to the hospital  DO NOT TAKE ANY DIABETIC MEDICATIONS DAY OF YOUR SURGERY                               You may not have any metal on your body including              piercings  Do not wear jewelry,  lotions, powders or deodorant                          Men may shave face and neck.   Do not bring valuables to the hospital. [facility] IS NOT             RESPONSIBLE   FOR VALUABLES.  Contacts, dentures or bridgework may not be worn into surgery.                    Please read over the following fact sheets you were given: _____________________________________________________________________             [facility] - Preparing for Surgery Before surgery, you can play an important role.  Because skin is not sterile, your skin needs to be as free of germs as possible.  You can reduce the number of germs on your skin by washing with CHG (chlorahexidine gluconate) soap before surgery.  CHG is an antiseptic cleaner which kills  germs and bonds with the skin to continue killing germs even after washing. Please DO NOT use if you have an allergy to CHG or antibacterial soaps.  If your skin becomes reddened/irritated stop using the CHG and inform your nurse when you arrive at Short Stay.  You may shave your face/neck.  Please follow these instructions carefully:  1.  Shower with CHG Soap the night before surgery and the  morning of Surgery.  2.  If you choose to wash your hair, wash your hair first as usual with your  normal  shampoo.  3.  After you shampoo, rinse your hair and body thoroughly to remove the  shampoo.  4.  Use CHG as you would any other liquid soap.  You can apply chg directly  to the skin and wash                       Gently with a scrungie or clean washcloth.  5.  Apply the CHG Soap to your body ONLY FROM THE NECK DOWN.   Do not use on face/ open                           Wound or open sores. Avoid contact with eyes, ears mouth and genitals (private parts).                       Wash face,  Genitals (private parts) with your normal soap.             6.  Wash thoroughly, paying special attention to the area where your surgery  will be performed.  7.  Thoroughly rinse your body with warm water from the neck down.  8.  DO NOT shower/wash with your normal soap after using and rinsing off  the CHG Soap.             9.  Pat yourself dry with a clean towel.            10.  Wear clean pajamas.            11.  Place clean sheets on your bed the night of your first shower and do not  sleep with pets. Day of Surgery : Do not apply any lotions/deodorants the morning of surgery.  Please wear clean clothes to the hospital/surgery center.  FAILURE TO FOLLOW THESE INSTRUCTIONS MAY RESULT IN THE CANCELLATION OF YOUR SURGERY PATIENT SIGNATURE_________________________________  NURSE  SIGNATURE__________________________________  ________________________________________________________________________   Adam Phenix  An incentive spirometer is a tool that can help keep your lungs clear and active. This tool measures how well you are filling your lungs with each breath. Taking long deep breaths may help reverse or decrease the chance of developing breathing (pulmonary) problems (especially infection) following: A long period of time when you are unable to move or be active. BEFORE THE PROCEDURE  If the spirometer includes an indicator to show your best effort, your nurse or respiratory therapist will set it to a desired goal. If possible, sit up straight or lean slightly forward. Try not to slouch. Hold the incentive spirometer in an upright position. INSTRUCTIONS FOR USE  Sit on the edge of your bed if possible, or sit up as far as you can in bed or on a chair. Hold the incentive spirometer in an upright position. Breathe out normally. Place the mouthpiece in your mouth and seal your lips tightly around it. Breathe in slowly and as deeply as possible, raising the piston or the ball toward the top of the column. Hold your breath for 3-5 seconds or for as long as possible. Allow the piston or ball to fall to the bottom of the column. Remove the mouthpiece from your mouth and breathe out normally. Rest for a few seconds and repeat Steps 1 through 7 at least 10 times every 1-2 hours when you are awake. Take your time and take a few normal breaths between deep breaths. The spirometer may include an indicator to show your best effort. Use the indicator as a goal to work toward during each repetition. After  each set of 10 deep breaths, practice coughing to be sure your lungs are clear. If you have an incision (the cut made at the time of surgery), support your incision when coughing by placing a pillow or rolled up towels firmly against it. Once you are able to get out of  bed, walk around indoors and cough well. You may stop using the incentive spirometer when instructed by your caregiver.  RISKS AND COMPLICATIONS Take your time so you do not get dizzy or light-headed. If you are in pain, you may need to take or ask for pain medication before doing incentive spirometry. It is harder to take a deep breath if you are having pain. AFTER USE Rest and breathe slowly and easily. It can be helpful to keep track of a log of your progress. Your caregiver can provide you with a simple table to help with this. If you are using the spirometer at home, follow these instructions: [place] IF:  You are having difficultly using the spirometer. You have trouble using the spirometer as often as instructed. Your pain medication is not giving enough relief while using the spirometer. You develop fever of 100.5 F (38.1 C) or higher. SEEK IMMEDIATE MEDICAL CARE IF:  You cough up bloody sputum that had not been present before. You develop fever of 102 F (38.9 C) or greater. You develop worsening pain at or near the incision site. MAKE SURE YOU:  Understand these instructions. Will watch your condition. Will get help right away if you are not doing well or get worse. Document Released: 10/07/2006 Document Revised: 08/19/2011 Document Reviewed: 12/08/2006 [employer] Patient Information 2014 [place], [place].   ________________________________________________________________________

## 2021-01-18 ENCOUNTER — Encounter (HOSPITAL_COMMUNITY): Payer: Self-pay

## 2021-01-18 ENCOUNTER — Other Ambulatory Visit: Payer: Self-pay

## 2021-01-18 ENCOUNTER — Encounter (HOSPITAL_COMMUNITY)
Admission: RE | Admit: 2021-01-18 | Discharge: 2021-01-18 | Disposition: A | Discharge status: Home / Self Care | Payer: Medicare Other | Source: Ambulatory Visit | Attending: Orthopedic Surgery | Admitting: Orthopedic Surgery

## 2021-01-18 DIAGNOSIS — I130 Hypertensive heart and chronic kidney disease with heart failure and stage 1 through stage 4 chronic kidney disease, or unspecified chronic kidney disease: Secondary | ICD-10-CM | POA: Insufficient documentation

## 2021-01-18 DIAGNOSIS — Z01812 Encounter for preprocedural laboratory examination: Secondary | ICD-10-CM | POA: Insufficient documentation

## 2021-01-18 DIAGNOSIS — Z79899 Other long term (current) drug therapy: Secondary | ICD-10-CM | POA: Diagnosis not present

## 2021-01-18 DIAGNOSIS — Z7982 Long term (current) use of aspirin: Secondary | ICD-10-CM | POA: Diagnosis not present

## 2021-01-18 DIAGNOSIS — Z955 Presence of coronary angioplasty implant and graft: Secondary | ICD-10-CM | POA: Diagnosis not present

## 2021-01-18 DIAGNOSIS — I5022 Chronic systolic (congestive) heart failure: Secondary | ICD-10-CM | POA: Insufficient documentation

## 2021-01-18 DIAGNOSIS — M1712 Unilateral primary osteoarthritis, left knee: Secondary | ICD-10-CM | POA: Diagnosis not present

## 2021-01-18 DIAGNOSIS — N189 Chronic kidney disease, unspecified: Secondary | ICD-10-CM | POA: Diagnosis not present

## 2021-01-18 DIAGNOSIS — E1122 Type 2 diabetes mellitus with diabetic chronic kidney disease: Secondary | ICD-10-CM | POA: Diagnosis not present

## 2021-01-18 DIAGNOSIS — I255 Ischemic cardiomyopathy: Secondary | ICD-10-CM | POA: Insufficient documentation

## 2021-01-18 DIAGNOSIS — Z95810 Presence of automatic (implantable) cardiac defibrillator: Secondary | ICD-10-CM | POA: Insufficient documentation

## 2021-01-18 HISTORY — DX: Prediabetes: R73.03

## 2021-01-18 HISTORY — DX: Unspecified osteoarthritis, unspecified site: M19.90

## 2021-01-18 LAB — CBC
HCT: 41.7 % (ref 39.0–52.0)
Hemoglobin: 13.9 g/dL (ref 13.0–17.0)
MCH: 33.4 pg (ref 26.0–34.0)
MCHC: 33.3 g/dL (ref 30.0–36.0)
MCV: 100.2 fL — ABNORMAL HIGH (ref 80.0–100.0)
Platelets: 175 10*3/uL (ref 150–400)
RBC: 4.16 MIL/uL — ABNORMAL LOW (ref 4.22–5.81)
RDW: 12.7 % (ref 11.5–15.5)
WBC: 7.3 10*3/uL (ref 4.0–10.5)
nRBC: 0 % (ref 0.0–0.2)

## 2021-01-18 LAB — COMPREHENSIVE METABOLIC PANEL
ALT: 19 U/L (ref 0–44)
AST: 21 U/L (ref 15–41)
Albumin: 4.3 g/dL (ref 3.5–5.0)
Alkaline Phosphatase: 55 U/L (ref 38–126)
Anion gap: 7 (ref 5–15)
BUN: 16 mg/dL (ref 8–23)
CO2: 28 mmol/L (ref 22–32)
Calcium: 9.6 mg/dL (ref 8.9–10.3)
Chloride: 105 mmol/L (ref 98–111)
Creatinine, Ser: 1.09 mg/dL (ref 0.61–1.24)
GFR, Estimated: >60 mL/min (ref >60)
Glucose, Bld: 151 mg/dL — ABNORMAL HIGH (ref 70–99)
Potassium: 4.4 mmol/L (ref 3.5–5.1)
Sodium: 140 mmol/L (ref 135–145)
Total Bilirubin: 1.1 mg/dL (ref 0.3–1.2)
Total Protein: 7 g/dL (ref 6.5–8.1)

## 2021-01-18 LAB — TYPE AND SCREEN
ABO/RH(D): A POS
Antibody Screen: NEGATIVE

## 2021-01-18 LAB — HEMOGLOBIN A1C
Hgb A1c MFr Bld: 6.5 % — ABNORMAL HIGH (ref 4.8–5.6)
Mean Plasma Glucose: 139.85 mg/dL

## 2021-01-18 LAB — SURGICAL PCR SCREEN
MRSA, PCR: NEGATIVE
Staphylococcus aureus: NEGATIVE

## 2021-01-18 LAB — PROTIME-INR
INR: 1.1 (ref 0.8–1.2)
Prothrombin Time: 14.2 seconds (ref 11.4–15.2)

## 2021-01-18 NOTE — Progress Notes (Signed)
COVID Vaccine Completed:Yes Date COVID Vaccine completed:06/14/20 COVID vaccine manufacturer: Pfizer    PCP - Dr. Rosalita Levan LOV 01/01/21 clearance in care everywhere Cardiologist - Dr. Judie Petit. Croitoru LOV 12/06/20, clearance 12/20/20 epic  Chest x-ray - no EKG - 12/06/20-epic Stress Test - 01/03/21-epic ECHO - 12/29/20-epic Cardiac Cath - Heart cath with stent 1998, 09/05/16 Pacemaker/ICD device last checked:AICD placed 2018. Last check 12/06/20. Pt will have it checked again on 01/22/21.  Sleep Study - no CPAP -   Fasting Blood Sugar - NA Checks Blood Sugar _____ times a day  Blood Thinner Instructions:ASA 81/ Dr. Allayne Stack Aspirin Instructions:Stop 5 days prior to [employer] Lequita Halt Last Dose:01/23/21  Anesthesia review: Yes  Patient denies shortness of breath, fever, cough and chest pain at PAT appointment Pt has no SOB climbing stairs, doing housework or with ADLs. 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  Patient verbalized understanding of instructions that were given to them at the PAT appointment. Patient was also instructed that they will need to review over the PAT instructions again at home before surgery. yes

## 2021-01-22 ENCOUNTER — Encounter: Payer: Self-pay | Admitting: Cardiovascular Disease

## 2021-01-22 ENCOUNTER — Ambulatory Visit (INDEPENDENT_AMBULATORY_CARE_PROVIDER_SITE_OTHER): Payer: Medicare Other | Admitting: Cardiovascular Disease

## 2021-01-22 ENCOUNTER — Other Ambulatory Visit: Payer: Self-pay

## 2021-01-22 VITALS — BP 126/80 | HR 54 | Ht 75.5 in | Wt 262.2 lb

## 2021-01-22 DIAGNOSIS — I2510 Atherosclerotic heart disease of native coronary artery without angina pectoris: Secondary | ICD-10-CM | POA: Diagnosis not present

## 2021-01-22 DIAGNOSIS — E669 Obesity, unspecified: Secondary | ICD-10-CM

## 2021-01-22 DIAGNOSIS — E7800 Pure hypercholesterolemia, unspecified: Secondary | ICD-10-CM

## 2021-01-22 DIAGNOSIS — I5022 Chronic systolic (congestive) heart failure: Secondary | ICD-10-CM

## 2021-01-22 DIAGNOSIS — Z01810 Encounter for preprocedural cardiovascular examination: Secondary | ICD-10-CM

## 2021-01-22 DIAGNOSIS — Z95810 Presence of automatic (implantable) cardiac defibrillator: Secondary | ICD-10-CM

## 2021-01-22 DIAGNOSIS — E1169 Type 2 diabetes mellitus with other specified complication: Secondary | ICD-10-CM

## 2021-01-22 DIAGNOSIS — I10 Essential (primary) hypertension: Secondary | ICD-10-CM

## 2021-01-22 DIAGNOSIS — I4902 Ventricular flutter: Secondary | ICD-10-CM

## 2021-01-22 DIAGNOSIS — I255 Ischemic cardiomyopathy: Secondary | ICD-10-CM | POA: Diagnosis not present

## 2021-01-22 NOTE — Progress Notes (Signed)
Cardiology Office Note:    Date:  01/22/2021   ID:  Kevin Patel, DOB Mar 08, 1946, MRN [id number]  PCP:  [name]., MD  Cardiologist:  [name], MD   Referring MD: [name]., MD   Chief Complaint  Patient presents with   Irregular Heart Beat        Pre-op Exam    History of Present Illness:    Kevin Patel is a 75 y.o. 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 Previously reported LV apical thrombus has not been seen on his recent echos since 2012 or his recent LV angiogram. He has a single lead Medtronic VISIA AF device, MRI conditional.  The lead is a Medtronic [id number].    He received an appropriate defibrillator discharge for ventricular flutter on 12/05/2020 (the first time of his device has delivered therapy).  There was no evidence of heart failure exacerbation, intercurrent illness or acute infarction.  Device function is normal.  He reported compliance with medications.  Labs and ischemic work-up did not show any cause of findings.  He is planning to undergo elective total knee replacement on August 22.  The patient specifically denies any chest pain at rest exertion, dyspnea at rest or with exertion, orthopnea, paroxysmal nocturnal dyspnea, syncope, palpitations, focal neurological deficits, intermittent claudication, lower extremity edema, unexplained weight gain, cough, hemoptysis or wheezing.    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   Arthritis    knees   Chronic systolic HF (heart failure) (HCC)    Coronary artery disease    Diabetes mellitus with coincident hypertension (HCC) 09/09/2016   History of kidney stones    HTN (hypertension)    Hyperlipidemia    Ischemic cardiomyopathy    MI (myocardial infarction) (HCC) 1998   2018,    Mural thrombus of heart    on coumadin until follow up echo with contrast in Sept 13, 2012 which showed no thrombus and coumadin was d/c   Pre-diabetes     Past Surgical History:  Procedure Laterality Date   CORONARY ANGIOPLASTY  06/10/1996   PCI   CORONARY STENT INTERVENTION N/A 09/05/2016   Procedure: Coronary Stent Intervention;  Surgeon: [name], MD;  Location: [facility];  Service: Cardiovascular;  Laterality: N/A;   HERNIA REPAIR  2004   ICD IMPLANT N/A 02/12/2017   Procedure: ICD Implant;  Surgeon: [name], MD;  Location: [facility];  Service: Cardiovascular;  Laterality: N/A;   INTRAVASCULAR ULTRASOUND/IVUS N/A 09/05/2016   Procedure: Intravascular Ultrasound/IVUS;  Surgeon: [name], MD;  Location: [facility];  Service: Cardiovascular;  Laterality: N/A;   LEFT HEART CATH AND CORONARY ANGIOGRAPHY N/A 09/05/2016   Procedure: Left Heart Cath and Coronary Angiography;  Surgeon: [name], MD;  Location: [facility] INVASIVE CV LAB;  Service: Cardiovascular;  Laterality: N/A;    Current Medications: Current Meds  Medication Sig   acetaminophen (TYLENOL) 325 MG tablet Take 2 tablets (650 mg total) by mouth every 4 (four) hours as needed for headache or mild pain.   amLODipine (NORVASC) 5 MG tablet Take 1 tablet (5 mg total) by mouth daily.   Ascorbic Acid (VITAMIN C) 1000 MG tablet Take 1,000 mg by mouth daily.   aspirin 81 MG tablet Take 81 mg  by mouth every morning.    atorvastatin (LIPITOR) 80 MG tablet Take 1 tablet by mouth once daily   carvedilol (COREG) 25 MG tablet Take 1 tablet (25 mg total) by mouth 2 (two) times daily with a meal.   cholecalciferol (VITAMIN D) 25 MCG (1000 UNIT) tablet Take 1,000 Units by mouth daily.   co-enzyme Q-10 30 MG capsule Take 30 mg by mouth daily.    Glucos-Chondroit-Hyaluron-MSM (GLUCOSAMINE CHONDROITIN JOINT PO) Take 400 mg by mouth daily.   Multiple Vitamin (MULTIVITAMIN)  capsule Take 1 capsule by mouth daily.   sacubitril-valsartan (ENTRESTO) 49-51 MG Take 1 tablet by mouth 2 (two) times daily.   vitamin E 1000 UNIT capsule Take 1,000 Units by mouth daily.      Allergies:   Patient has no known allergies.   Social History   Socioeconomic History   Marital status: Married    Spouse name: Not on file   Number of children: Not on file   Years of education: Not on file   Highest education level: Not on file  Occupational History   Not on file  Tobacco Use   Smoking status: Former    Packs/day: 1.00    Years: 20.00    Pack years: 20.00    Types: Cigarettes    Quit date: 02/12/1993    Years since quitting: 27.9   Smokeless tobacco: Never  Vaping Use   Vaping Use: Never used  Substance and Sexual Activity   Alcohol use: No   Drug use: No   Sexual activity: Not on file  Other Topics Concern   Not on file  Social History Narrative   Not on file   Social Determinants of Health   Financial Resource Strain: Not on file  Food Insecurity: Not on file  Transportation Needs: Not on file  Physical Activity: Not on file  Stress: Not on file  Social Connections: Not on file     Family History: The patient's family history includes Emphysema in his father. ROS:   Please see the history of present illness.    All other systems are reviewed and are negative.  EKGs/Labs/Other Studies Reviewed:    The following studies were reviewed today: Last echocardiogram and coronary angiograms Comprehensive check of his ICD today.  EKG: Is ordered today and personally reviewed.  It shows sinus bradycardia, poor R wave progression across anterior precordium and Q waves in leads I and aVL with subtle lateral ST segment depression and prominent T wave inversion in leads V4-V6.  Normal QTC 424 ms.  There are no PVCs on the current tracing.  Otherwise it is very similar to previous tracings.    Recent Labs: 12/06/2020: BNP 469.1; Magnesium 2.0 01/18/2021: ALT 19;  BUN 16; Creatinine, Ser 1.09; Hemoglobin 13.9; Platelets 175; Potassium 4.4; Sodium 140  06/03/2019 Hemoglobin A1c 6.3% Total cholesterol 125, triglycerides 123, HDL 46, LDL 65 Glucose 139, creatinine 1.15, normal liver function test, potassium 3.7, hemoglobin 14.1  01/29/2020 Hemoglobin 13.4, creatinine 1.25, potassium 3.7, triglycerides 92 Recent Lipid Panel    Component Value Date/Time   CHOL 119 07/22/2018 1104   TRIG 92 01/26/2020 2028   HDL 40 07/22/2018 1104   CHOLHDL 3.0 07/22/2018 1104   CHOLHDL 2.8 09/16/2016 0949   VLDL 22 09/16/2016 0949   LDLCALC 55 07/22/2018 1104   06/07/2020 Total cholesterol 156, triglycerides 139, HDL 61, LDL 87, A1c 8.1%, creatinine 1.18, hemoglobin 13.8  Physical Exam:    VS:  BP 126/80 (BP Location: Left  Arm, Patient Position: Sitting, Cuff Size: Large)   Pulse (!) 54   Ht 6' 3.5" (1.918 m)   Wt 262 lb 3.2 oz (118.9 kg)   SpO2 97%   BMI 32.34 kg/m     Wt Readings from Last 3 Encounters:  01/22/21 262 lb 3.2 oz (118.9 kg)  01/18/21 258 lb (117 kg)  01/03/21 241 lb (109.3 kg)      General: Alert, oriented x3, no distress, appears younger than stated age.  Mildly obese, but also remarkably fit for his age.  Healthy left subclavian ICD site. Head: no evidence of trauma, PERRL, EOMI, no exophtalmos or lid lag, no myxedema, no xanthelasma; normal ears, nose and oropharynx Neck: normal jugular venous pulsations and no hepatojugular reflux; brisk carotid pulses without delay and no carotid bruits Chest: clear to auscultation, no signs of consolidation by percussion or palpation, normal fremitus, symmetrical and full respiratory excursions Cardiovascular: normal position and quality of the apical impulse, regular rhythm, normal first and second heart sounds, no murmurs, rubs or gallops Abdomen: no tenderness or distention, no masses by palpation, no abnormal pulsatility or arterial bruits, normal bowel sounds, no hepatosplenomegaly Extremities:  no clubbing, cyanosis or edema; 2+ radial, ulnar and brachial pulses bilaterally; 2+ right femoral, posterior tibial and dorsalis pedis pulses; 2+ left femoral, posterior tibial and dorsalis pedis pulses; no subclavian or femoral bruits Neurological: grossly nonfocal Psych: Normal mood and affect  ASSESSMENT:    1. Ventricular flutter (HCC)   2. Chronic systolic heart failure (HCC)   3. Coronary artery disease involving native coronary artery of native heart without angina pectoris   4. Essential hypertension   5. Diabetes mellitus type 2 in obese (HCC)   6. Hypercholesterolemia   7. ICD (implantable cardioverter-defibrillator) in place   8. Mild obesity   9. Preoperative cardiovascular examination      PLAN:    In order of problems listed above:  Ventricular flutter:   a single event occurred in June 2022 and was immediately and appropriately treated by his defibrillator.  No recurrence.  No obvious cause.  Follow-up nuclear stress testing showed low risk findings and labs were normal.   Carvedilol dose was increased.  Sherryll Burger was added instead of his previous RAAS inhibitor.  Stressed the importance of compliance with beta-blocker without interruption.  No driving until December 7989. CHF: Clinically euvolemic.  NYHA functional class I.  On Entresto and carvedilol (the latter at maximum dose).  We will hold off on titrating Entresto or adding SGLT2 inhibitor or spironolactone considering his upcoming surgical procedure.  We will revisit that postop. CAD: Extensive scar, but no active ischemia on recent nuclear stress test.  S/p anterior MI 1998, s/p LAD-DES 09/05/2016.  Asymptomatic, no angina pectoris.  On aspirin and beta-blocker.  HTN: Well-controlled.  Should To increase his Entresto further after his surgery. DM: Hemoglobin A1c has improved further, now down to 6.6%. HLP: All parameters at target, continue statin ICD: normal device function. Appropriate and successful high energy  therapy delivered in June 2022.  No changes made to device settings. Overweight: congratulated on weight loss. Preop cardiovascular exam: Inevitably, his risk is increased to due to his serious underlying chronic conditions, but all of these are currently well compensated.  Very important not to miss any doses of beta-blocker during the perioperative period.  Medication Adjustments/Labs and Tests Ordered: Current medicines are reviewed at length with the patient today.  Concerns regarding medicines are outlined above.  Orders Placed This Encounter  Procedures   EKG 12-Lead     No orders of the defined types were placed in this encounter.   Signed, [name], MD  01/22/2021 9:06 PM    [facility]

## 2021-01-22 NOTE — Patient Instructions (Signed)

## 2021-01-22 NOTE — Progress Notes (Addendum)
Anesthesia Chart Review:   Case: [id number] Date/Time: 01/29/21 0700   Procedure: TOTAL KNEE ARTHROPLASTY (Left: Knee)   Anesthesia type: Choice   Pre-op diagnosis: left knee osteoarthritis   Location: [facility] / [facility]   Surgeons: Ollen Gross, MD       DISCUSSION: Pt is 75 years old with hx CAD (DES to LAD 2018; MI 1998), chronic systolic HF, ischemic cardiomyopathy (EF 30-35%), AICD (Medtronic, implanted 2018; appropriate device discharge for ventricular flutter 12/05/20- no cause of v-flutter identified), HTN, DM, CKD  Pt has cardiac clearance from [name], MD (see last office visit note 01/22/21), who documents "Inevitably, his risk is increased to due to his serious underlying chronic conditions, but all of these are currently well compensated.  Very important not to miss any doses of beta-blocker during the perioperative period."   Perioperative prescription for AICD:  Have magnet available. Provide continuous ECG monitoring when magnet is used or reprogramming is to be performed.  Procedure should not interfere with device function.  No device programming or magnet placement needed   VS: BP 135/87   Pulse (!) 57   Temp 36.6 C (Oral)   Resp 20   Ht 6\' 1"  (1.854 m)   Wt 117 kg   SpO2 99%   BMI 34.04 kg/m   PROVIDERS: - PCP is ., MD. Medically cleared for surgery at last office visit with [name], FNP - Cardiologist is Koren Bound, MD. Last office visit 01/22/21    LABS: Labs reviewed: Acceptable for surgery. (all labs ordered are listed, but only abnormal results are displayed)  Labs Reviewed  CBC - Abnormal; Notable for the following components:      Result Value   RBC 4.16 (*)    MCV 100.2 (*)    All other components within normal limits  COMPREHENSIVE METABOLIC PANEL - Abnormal; Notable for the following components:   Glucose, Bld 151 (*)    All other components within normal limits  HEMOGLOBIN A1C - Abnormal; Notable for the  following components:   Hgb A1c MFr Bld 6.5 (*)    All other components within normal limits  SURGICAL PCR SCREEN  PROTIME-INR  TYPE AND SCREEN     IMAGES: 1 view CXR 01/26/20:  - Bibasilar ground-glass airspace disease superimposed upon interstitial prominence, consistent with multifocal COVID-19 pneumonia.   EKG 01/22/21: (from notes, not scanned into Epic yet): sinus bradycardia, poor R wave progression across anterior precordium and Q waves in leads I and aVL with subtle lateral ST segment depression and prominent T wave inversion in leads V4-V6.  Normal QTC 424 ms.  EKG 12/06/20: NSR, frequent monomorphic PVCs, old ST depression in I and aVL, preexisting poor R wave progression consistent with old anterior MI. QTc 447 ms.   CV: Nuclear stress test 01/03/21:  The left ventricular ejection fraction is severely decreased (<30%). Nuclear stress EF: 26%. There was no ST segment deviation noted during stress. There is a large, severe, non-reversible defect in all anterior, inferior, anteroseptal, apical LV segments and apex. There is relative sparing of the lateral LV wall (except apical portion) and mid-inferoseptal LV wall. 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 This is a high risk study.  Echo 12/29/20:  1. Diffuse hypokinesis septal and apical akinesis consistent with previous anterior MI. No mural apical thrombus by definity. Previous echo not online for comparision But described as EF 30-35% . Left ventricular ejection fraction, by estimation, is 25 to 30%. The left  ventricle has severely decreased function. The left ventricle has no regional wall motion abnormalities. The left ventricular internal cavity size was severely dilated. Left ventricular diastolic parameters were normal.   2. Device catheter in RA/RV. Right ventricular systolic function is normal. The right ventricular size is normal.   3. Left atrial size was moderately dilated.   4. Right  atrial size was mildly dilated.   5. The mitral valve is abnormal. Mild mitral valve regurgitation. No evidence of mitral stenosis.   6. The aortic valve is tricuspid. There is mild calcification of the aortic valve. Aortic valve regurgitation is trivial. Mild aortic valve sclerosis is present, with no evidence of aortic valve stenosis.   7. The inferior vena cava is normal in size with greater than 50% respiratory variability, suggesting right atrial pressure of 3 mmHg.    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   Arthritis    knees   Chronic systolic HF (heart failure) (HCC)    Coronary artery disease    Diabetes mellitus with coincident hypertension (HCC) 09/09/2016   History of kidney stones    HTN (hypertension)    Hyperlipidemia    Ischemic cardiomyopathy    MI (myocardial infarction) (HCC) 1998   2018,   Mural thrombus of heart    on coumadin until follow up echo with contrast in Sept 13, 2012 which showed no thrombus and coumadin was d/c   Pre-diabetes     Past Surgical History:  Procedure Laterality Date   CORONARY ANGIOPLASTY  06/10/1996   PCI   CORONARY STENT INTERVENTION N/A 09/05/2016   Procedure: Coronary Stent Intervention;  Surgeon: [name], MD;  Location: [facility]VASIVE CV LAB;  Service: Cardiovascular;  Laterality: N/A;   HERNIA REPAIR  2004   ICD IMPLANT N/A 02/12/2017   Procedure: ICD Implant;  Surgeon: [name], MD;  Location: [facility];  Service: Cardiovascular;  Laterality: N/A;   INTRAVASCULAR ULTRASOUND/IVUS N/A 09/05/2016   Procedure: Intravascular Ultrasound/IVUS;  Surgeon: [name], MD;  Location: [facility];  Service: Cardiovascular;  Laterality: N/A;   LEFT HEART CATH AND CORONARY ANGIOGRAPHY N/A 09/05/2016   Procedure: Left Heart Cath and Coronary Angiography;  Surgeon: [name], MD;  Location: [facility] INVASIVE CV LAB;  Service:  Cardiovascular;  Laterality: N/A;    MEDICATIONS:  acetaminophen (TYLENOL) 325 MG tablet   albuterol (VENTOLIN HFA) 108 (90 Base) MCG/ACT inhaler   amLODipine (NORVASC) 5 MG tablet   Ascorbic Acid (VITAMIN C) 1000 MG tablet   aspirin 81 MG tablet   atorvastatin (LIPITOR) 80 MG tablet   carvedilol (COREG) 25 MG tablet   cholecalciferol (VITAMIN D) 25 MCG (1000 UNIT) tablet   co-enzyme Q-10 30 MG capsule   Glucos-Chondroit-Hyaluron-MSM (GLUCOSAMINE CHONDROITIN JOINT PO)   Multiple Vitamin (MULTIVITAMIN) capsule   nitroGLYCERIN (NITROSTAT) 0.4 MG SL tablet   sacubitril-valsartan (ENTRESTO) 49-51 MG   vitamin E 1000 UNIT capsule   No current facility-administered medications for this encounter.    If no changes, I anticipate pt can proceed with surgery as scheduled.   Rica Mast, PhD, FNP-BC [facility]/Anesthesiology Phone: [phone] 01/23/2021 8:39 AM

## 2021-01-23 NOTE — Anesthesia Preprocedure Evaluation (Addendum)
Anesthesia Evaluation  Patient identified by MRN, date of birth, ID band Patient awake    Reviewed: Allergy & Precautions, NPO status , Patient's Chart, lab work & pertinent test results, reviewed documented beta blocker date and time   History of Anesthesia Complications Negative for: history of anesthetic complications  Airway Mallampati: II  TM Distance: >3 FB Neck ROM: Full    Dental  (+) Dental Advisory Given, Partial Upper   Pulmonary neg pulmonary ROS, former smoker,    Pulmonary exam normal        Cardiovascular hypertension, Pt. on medications and Pt. on home beta blockers + CAD, + Past MI and + Cardiac Stents  Normal cardiovascular exam+ Cardiac Defibrillator   Pt has cardiac clearance from [name], MD (see last office visit note 01/22/21), who documents "Inevitably, his risk is increased to due to his serious underlying chronic conditions, but all of these are currently well compensated. Very important not to miss any doses of beta-blocker during the perioperative period."  Perioperative prescription for AICD:  ? Have magnet available. ? Provide continuous ECG monitoring when magnet is used or reprogramming is to be performed. ? Procedure should not interfere with device function. No device programming or magnet placement needed  Nuclear stress test 01/03/21:  ? The left ventricular ejection fraction is severely decreased (<30%). ? Nuclear stress EF: 26%. ? There was no ST segment deviation noted during stress. ? There is a large, severe, non-reversible defect in all anterior, inferior, anteroseptal, apical LV segments and apex. There is relative sparing of the lateral LV wall (except apical portion) and mid-inferoseptal LV wall. ? 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 ? This is a high risk study.  Echo 12/29/20:  1. Diffuse hypokinesis septal and apical akinesis consistent with  previous anterior MI. No mural apical thrombus by definity. Previous echo not online for comparision But described as EF 30-35% . Left ventricular ejection fraction, by estimation, is 25 to 30%. The left ventricle has severely decreased function. The left ventricle has no regional wall motion abnormalities. The left ventricular internal cavity size was severely dilated. Left ventricular diastolic parameters were normal.  2. Device catheter in RA/RV. Right ventricular systolic function is normal. The right ventricular size is normal.  3. Left atrial size was moderately dilated.  4. Right atrial size was mildly dilated.  5. The mitral valve is abnormal. Mild mitral valve regurgitation. No evidence of mitral stenosis.  6. The aortic valve is tricuspid. There is mild calcification of the aortic valve. Aortic valve regurgitation is trivial. Mild aortic valve sclerosis is present, with no evidence of aortic valve stenosis.  7. The inferior vena cava is normal in size with greater than 50% respiratory variability, suggesting right atrial pressure of 3 mmHg.     Neuro/Psych negative neurological ROS     GI/Hepatic negative GI ROS, Neg liver ROS,   Endo/Other  diabetesHypothyroidism   Renal/GU Renal disease     Musculoskeletal negative musculoskeletal ROS (+)   Abdominal   Peds  Hematology negative hematology ROS (+)   Anesthesia Other Findings   Reproductive/Obstetrics                           Anesthesia Physical Anesthesia Plan  ASA: 3  Anesthesia Plan: Spinal   Post-op Pain Management:  Regional for Post-op pain   Induction:   PONV Risk Score and Plan: 2 and Ondansetron and Dexamethasone  Airway Management Planned: Natural  Airway  Additional Equipment:   Intra-op Plan:   Post-operative Plan:   Informed Consent: I have reviewed the patients History and Physical, chart, labs and discussed the procedure including the risks, benefits and  alternatives for the proposed anesthesia with the patient or authorized representative who has indicated his/her understanding and acceptance.     Dental advisory given  Plan Discussed with: CRNA and Anesthesiologist  Anesthesia Plan Comments: (See APP note by Joslyn Hy, FNP. Pt with ICM (EF 30-35%), AICD (device discharged in June for vent flutter))      Anesthesia Quick Evaluation

## 2021-01-25 ENCOUNTER — Other Ambulatory Visit: Payer: Self-pay | Admitting: Orthopedic Surgery

## 2021-01-25 LAB — SARS CORONAVIRUS 2 (TAT 6-24 HRS): SARS Coronavirus 2: NEGATIVE

## 2021-01-26 ENCOUNTER — Telehealth: Payer: Self-pay | Admitting: *Deleted

## 2021-01-26 NOTE — Telephone Encounter (Signed)
Entresto assistance has been faxed to [employer]

## 2021-01-28 MED ORDER — BUPIVACAINE LIPOSOME 1.3 % IJ SUSP
20.0000 mL | Freq: Once | INTRAMUSCULAR | Status: DC
  Filled 2021-01-28: qty 20

## 2021-01-28 NOTE — H&P (Signed)
TOTAL KNEE ADMISSION H&P  Patient is being admitted for left total knee arthroplasty.  Subjective:  Chief Complaint: Left knee pain.  HPI: Kevin Patel, 75 y.o. male has a history of pain and functional disability in the left knee due to arthritis and has failed non-surgical conservative treatments for greater than 12 weeks to include NSAID's and/or analgesics, corticosteriod injections, and activity modification. Onset of symptoms was gradual, starting  several  years ago with gradually worsening course since that time. The patient noted no past surgery on the left knee.  Patient currently rates pain in the left knee at 7 out of 10 with activity. Patient has worsening of pain with activity and weight bearing, pain that interferes with activities of daily living, pain with passive range of motion, and crepitus. Patient has evidence of periarticular osteophytes and joint space narrowing by imaging studies. There is no active infection.  Patient Active Problem List   Diagnosis Date Noted   ARF (acute renal failure) (HCC) 01/26/2020   COVID-19 virus infection 01/26/2020   Dehydration    Recurrent UTI 03/25/2018   Anemia, chronic disease 09/10/2017   Dysuria 09/10/2017   At risk for sudden cardiac death 03-13-17   Chronic systolic heart failure (HCC) 03-13-17   ICD (implantable cardioverter-defibrillator) in place 13-Mar-2017   Hypothyroidism (acquired) 09/11/2016   CKD (chronic kidney disease) stage 3, GFR 30-59 ml/min 09/10/2016   History of MI (myocardial infarction) 09/10/2016   Diabetes mellitus with coincident hypertension (HCC) 09/09/2016   Acute kidney injury (HCC) 09/07/2016   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) 09/07/2016   NSTEMI (non-ST elevated myocardial infarction) (HCC) 09/05/2016   Unstable angina (HCC) 09/05/2016   Class 1 obesity due to excess calories with serious comorbidity and body mass index (BMI) of 31.0 to 31.9 in adult 08/11/2015   Coronary  arteriosclerosis 08/11/2015   Diabetes, polyneuropathy (HCC) 08/11/2015   Elevated PSA 08/11/2015   Hyperlipidemia 08/11/2015   Long-term use of high-risk medication 08/11/2015   Osteoarthritis 08/11/2015   Ischemic cardiomyopathy 03-13-13   Essential hypertension 03-13-2013   Dyslipidemia 03/13/2013    Past Medical History:  Diagnosis Date   AICD (automatic cardioverter/defibrillator) present 13-Mar-2017   Arthritis    knees   Chronic systolic HF (heart failure) (HCC)    Coronary artery disease    Diabetes mellitus with coincident hypertension (HCC) 09/09/2016   History of kidney stones    HTN (hypertension)    Hyperlipidemia    Ischemic cardiomyopathy    MI (myocardial infarction) (HCC) 1998   2018,   Mural thrombus of heart    on coumadin until follow up echo with contrast in Sept 13, 2012 which showed no thrombus and coumadin was d/c   Pre-diabetes     Past Surgical History:  Procedure Laterality Date   CORONARY ANGIOPLASTY  06/10/1996   PCI   CORONARY STENT INTERVENTION N/A 09/05/2016   Procedure: Coronary Stent Intervention;  Surgeon: [name], MD;  Location: [facility] INVASIVE CV LAB;  Service: Cardiovascular;  Laterality: N/A;   HERNIA REPAIR  2004   ICD IMPLANT N/A 2017/03/13   Procedure: ICD Implant;  Surgeon: [name], MD;  Location: [facility] INVASIVE CV LAB;  Service: Cardiovascular;  Laterality: N/A;   INTRAVASCULAR ULTRASOUND/IVUS N/A 09/05/2016   Procedure: Intravascular Ultrasound/IVUS;  Surgeon: [name], MD;  Location: [facility] INVASIVE CV [facility];  Service: Cardiovascular;  Laterality: N/A;   LEFT HEART CATH AND CORONARY ANGIOGRAPHY N/A 09/05/2016   Procedure: Left Heart Cath  and Coronary Angiography;  Surgeon: [name], MD;  Location[facility] CV LAB;  Service: Cardiovascular;  Laterality: N/A;    Prior to Admission medications   Medication Sig Start Date End Date Taking? Authorizing Provider  acetaminophen (TYLENOL) 325  MG tablet Take 2 tablets (650 mg total) by mouth every 4 (four) hours as needed for headache or mild pain. 09/07/16  Yes Kilroy, Luke K, PA-C  albuterol (VENTOLIN HFA) 108 (90 Base) MCG/ACT inhaler Inhale 2 puffs into the lungs every 6 (six) hours as needed for wheezing or shortness of breath. Patient not taking: Reported on 01/22/2021 01/31/20  Yes Leroy Sea, MD  amLODipine (NORVASC) 5 MG tablet Take 1 tablet (5 mg total) by mouth daily. 12/08/20  Yes Croitoru, Mihai, MD  Ascorbic Acid (VITAMIN C) 1000 MG tablet Take 1,000 mg by mouth daily.   Yes [provider]  aspirin 81 MG tablet Take 81 mg by mouth every morning.    Yes [provider]  atorvastatin (LIPITOR) 80 MG tablet Take 1 tablet by mouth once daily 11/13/20  Yes Croitoru, Mihai, MD  carvedilol (COREG) 25 MG tablet Take 1 tablet (25 mg total) by mouth 2 (two) times daily with a meal. 12/08/20  Yes Croitoru, Mihai, MD  cholecalciferol (VITAMIN D) 25 MCG (1000 UNIT) tablet Take 1,000 Units by mouth daily.   Yes [provider]  co-enzyme Q-10 30 MG capsule Take 30 mg by mouth daily.    Yes [provider]  Glucos-Chondroit-Hyaluron-MSM (GLUCOSAMINE CHONDROITIN JOINT PO) Take 400 mg by mouth daily.   Yes [provider]  Multiple Vitamin (MULTIVITAMIN) capsule Take 1 capsule by mouth daily.   Yes [provider]  nitroGLYCERIN (NITROSTAT) 0.4 MG SL tablet Place 0.4 mg under the tongue every 5 (five) minutes as needed for chest pain.  11/24/18  Yes [provider]  sacubitril-valsartan (ENTRESTO) 49-51 MG Take 1 tablet by mouth 2 (two) times daily. 01/04/21  Yes Croitoru, Mihai, MD  vitamin E 1000 UNIT capsule Take 1,000 Units by mouth daily.    Yes [provider]    No Known Allergies  Social History   Socioeconomic History   Marital status: Married    Spouse name: Not on file   Number of children: Not on file   Years of education: Not on file   Highest  education level: Not on file  Occupational History   Not on file  Tobacco Use   Smoking status: Former    Packs/day: 1.00    Years: 20.00    Pack years: 20.00    Types: Cigarettes    Quit date: 02/12/1993    Years since quitting: 27.9   Smokeless tobacco: Never  Vaping Use   Vaping Use: Never used  Substance and Sexual Activity   Alcohol use: No   Drug use: No   Sexual activity: Not on file  Other Topics Concern   Not on file  Social History Narrative   Not on file   Social Determinants of Health   Financial Resource Strain: Not on file  Food Insecurity: Not on file  Transportation Needs: Not on file  Physical Activity: Not on file  Stress: Not on file  Social Connections: Not on file  Intimate Partner Violence: Not on file    Tobacco Use: Medium Risk   Smoking Tobacco Use: Former   Smokeless Tobacco Use: Never   Social History   Substance and Sexual Activity  Alcohol Use No  Family History  Problem Relation Age of Onset   Emphysema Father     ROS: Constitutional: no fever, no chills, no night sweats, no significant weight loss Cardiovascular: no chest pain, no palpitations Respiratory: no cough, no shortness of breath, No COPD Gastrointestinal: no vomiting, no nausea Musculoskeletal: no swelling in Joints, Joint Pain Neurologic: no numbness, no tingling, no difficulty with balance   Objective:  Physical Exam: Well nourished and well developed.  General: Alert and oriented x3, cooperative and pleasant, no acute distress.  Head: normocephalic, atraumatic, neck supple.  Eyes: EOMI.  Respiratory: breath sounds clear in all fields, no wheezing, rales, or rhonchi. Cardiovascular: Regular rate and rhythm, no murmurs, gallops or rubs.  Abdomen: non-tender to palpation and soft, normoactive bowel sounds. Musculoskeletal:  The patient has an antalgic gait pattern favoring the left side without the use of assistive devices.     Bilateral Hip Exam:    Range of motion: Normal without discomfort.     Right Knee Exam:   No deformity.   No swelling present. No swelling present.   Range of motion: 5 to 125 degrees.   No crepitus on range of motion of the knee.   Slight lateral greater than medial joint line tenderness.   The knee is stable.     Left Knee Exam:   No effusion present. No swelling present. Varus deformity.   Range of motion: 5 to 120 degrees.   Moderate crepitus on range of motion of the knee.   Positive medial greater than lateral joint line tenderness.   The knee is stable.  Calves soft and nontender. Motor function intact in LE. Strength 5/5 LE bilaterally. Neuro: Distal pulses 2+. Sensation to light touch intact in LE.  Vital signs in last 24 hours:    Imaging Review Radiographs - AP and lateral of the bilateral knees from today demonstrate bone-on-bone arthritis in the bilateral knees, tricompartmental in nature, worse in the medial and patellofemoral compartments on the left, worse in the lateral and patellofemoral compartments on the right.  Assessment/Plan:  End stage arthritis, left knee   The patient history, physical examination, clinical judgment of the provider and imaging studies are consistent with end stage degenerative joint disease of the left knee and total knee arthroplasty is deemed medically necessary. The treatment options including medical management, injection therapy arthroscopy and arthroplasty were discussed at length. The risks and benefits of total knee arthroplasty were presented and reviewed. The risks due to aseptic loosening, infection, stiffness, patella tracking problems, thromboembolic complications and other imponderables were discussed. The patient acknowledged the explanation, agreed to proceed with the plan and consent was signed. Patient is being admitted for inpatient treatment for surgery, pain control, PT, OT, prophylactic antibiotics, VTE prophylaxis, progressive ambulation and  ADLs and discharge planning. The patient is planning to be discharged  home .   Patient's anticipated LOS is less than 2 midnights, meeting these requirements: - Younger than 47 - Lives within 1 hour of care - Has a competent adult at home to recover with post-op recover - NO history of  - Chronic pain requiring opiods  - Diabetes  - Coronary Artery Disease  - Heart failure  - Heart attack  - Stroke  - DVT/VTE  - Cardiac arrhythmia  - Respiratory Failure/COPD  - Renal failure  - Anemia  - Advanced Liver disease    Therapy Plans: [facility] Disposition: Home with Daughter Planned DVT Prophylaxis: Xarelto 10mg  DME Needed: None PCP:  Shary Decamp, MD (clearance received) Cardiologist: [name], MD TXA: IV Allergies: Use paper tape Anesthesia Concerns: Has had difficulty with epidural efficacy in the past with kidney stone procedure.  BMI: 33.9 Last HgbA1c: n/a  Pharmacy: [facility] on [address] in [place]   - Patient was instructed on what medications to stop prior to surgery. - Follow-up visit in 2 weeks with Dr. Lequita Halt - Begin physical therapy following surgery - Pre-operative lab work as pre-surgical testing - Prescriptions will be provided in hospital at time of discharge  Nelia Shi, [facility], PA-C Orthopedic Surgery [facility] [place]

## 2021-01-29 ENCOUNTER — Ambulatory Visit (HOSPITAL_COMMUNITY): Payer: Medicare Other | Admitting: Emergency Medicine

## 2021-01-29 ENCOUNTER — Encounter (HOSPITAL_COMMUNITY)
Admission: RE | Disposition: A | Discharge status: Home / Self Care | Payer: Self-pay | Source: Home / Self Care | Attending: Orthopedic Surgery

## 2021-01-29 ENCOUNTER — Inpatient Hospital Stay (HOSPITAL_COMMUNITY)
Admission: RE | Admit: 2021-01-29 | Discharge: 2021-01-31 | DRG: 470 | Disposition: A | Discharge status: Home / Self Care | Payer: Medicare Other | Attending: Orthopedic Surgery | Admitting: Orthopedic Surgery

## 2021-01-29 ENCOUNTER — Encounter (HOSPITAL_COMMUNITY): Payer: Self-pay | Admitting: Orthopedic Surgery

## 2021-01-29 ENCOUNTER — Other Ambulatory Visit: Payer: Self-pay

## 2021-01-29 ENCOUNTER — Ambulatory Visit (HOSPITAL_COMMUNITY): Payer: Medicare Other | Admitting: Anesthesiology

## 2021-01-29 DIAGNOSIS — Z7982 Long term (current) use of aspirin: Secondary | ICD-10-CM

## 2021-01-29 DIAGNOSIS — D631 Anemia in chronic kidney disease: Secondary | ICD-10-CM | POA: Diagnosis present

## 2021-01-29 DIAGNOSIS — I252 Old myocardial infarction: Secondary | ICD-10-CM

## 2021-01-29 DIAGNOSIS — E039 Hypothyroidism, unspecified: Secondary | ICD-10-CM | POA: Diagnosis present

## 2021-01-29 DIAGNOSIS — Z87440 Personal history of urinary (tract) infections: Secondary | ICD-10-CM

## 2021-01-29 DIAGNOSIS — Z79899 Other long term (current) drug therapy: Secondary | ICD-10-CM

## 2021-01-29 DIAGNOSIS — Z87891 Personal history of nicotine dependence: Secondary | ICD-10-CM

## 2021-01-29 DIAGNOSIS — M179 Osteoarthritis of knee, unspecified: Secondary | ICD-10-CM | POA: Diagnosis present

## 2021-01-29 DIAGNOSIS — M1712 Unilateral primary osteoarthritis, left knee: Principal | ICD-10-CM | POA: Diagnosis present

## 2021-01-29 DIAGNOSIS — Z95810 Presence of automatic (implantable) cardiac defibrillator: Secondary | ICD-10-CM

## 2021-01-29 DIAGNOSIS — I130 Hypertensive heart and chronic kidney disease with heart failure and stage 1 through stage 4 chronic kidney disease, or unspecified chronic kidney disease: Secondary | ICD-10-CM | POA: Diagnosis present

## 2021-01-29 DIAGNOSIS — M1710 Unilateral primary osteoarthritis, unspecified knee: Secondary | ICD-10-CM

## 2021-01-29 DIAGNOSIS — E785 Hyperlipidemia, unspecified: Secondary | ICD-10-CM | POA: Diagnosis present

## 2021-01-29 DIAGNOSIS — N183 Chronic kidney disease, stage 3 unspecified: Secondary | ICD-10-CM | POA: Diagnosis present

## 2021-01-29 DIAGNOSIS — Z955 Presence of coronary angioplasty implant and graft: Secondary | ICD-10-CM

## 2021-01-29 DIAGNOSIS — E1122 Type 2 diabetes mellitus with diabetic chronic kidney disease: Secondary | ICD-10-CM | POA: Diagnosis present

## 2021-01-29 DIAGNOSIS — M25762 Osteophyte, left knee: Secondary | ICD-10-CM | POA: Diagnosis present

## 2021-01-29 DIAGNOSIS — Z87442 Personal history of urinary calculi: Secondary | ICD-10-CM

## 2021-01-29 DIAGNOSIS — I2510 Atherosclerotic heart disease of native coronary artery without angina pectoris: Secondary | ICD-10-CM | POA: Diagnosis present

## 2021-01-29 DIAGNOSIS — Z8616 Personal history of COVID-19: Secondary | ICD-10-CM

## 2021-01-29 DIAGNOSIS — I255 Ischemic cardiomyopathy: Secondary | ICD-10-CM | POA: Diagnosis present

## 2021-01-29 DIAGNOSIS — E1142 Type 2 diabetes mellitus with diabetic polyneuropathy: Secondary | ICD-10-CM | POA: Diagnosis present

## 2021-01-29 DIAGNOSIS — I5022 Chronic systolic (congestive) heart failure: Secondary | ICD-10-CM | POA: Diagnosis present

## 2021-01-29 HISTORY — PX: TOTAL KNEE ARTHROPLASTY: SHX125

## 2021-01-29 LAB — GLUCOSE, CAPILLARY: Glucose-Capillary: 126 mg/dL — ABNORMAL HIGH (ref 70–99)

## 2021-01-29 SURGERY — ARTHROPLASTY, KNEE, TOTAL
Anesthesia: Spinal | Site: Knee | Laterality: Left

## 2021-01-29 MED ORDER — CEFAZOLIN SODIUM-DEXTROSE 2-4 GM/100ML-% IV SOLN
2.0000 g | Freq: Four times a day (QID) | INTRAVENOUS | Status: AC
Start: 2021-01-29 — End: 2021-01-30
  Administered 2021-01-29 (×2): 2 g via INTRAVENOUS
  Filled 2021-01-29 (×2): qty 100

## 2021-01-29 MED ORDER — DEXAMETHASONE SODIUM PHOSPHATE 10 MG/ML IJ SOLN
8.0000 mg | Freq: Once | INTRAMUSCULAR | Status: AC
Start: 2021-01-29 — End: 2021-01-29
  Administered 2021-01-29: 8 mg via INTRAVENOUS
  Administered 2021-01-29: 10 mg via INTRAVENOUS

## 2021-01-29 MED ORDER — ATORVASTATIN CALCIUM 40 MG PO TABS
80.0000 mg | ORAL_TABLET | Freq: Every day | ORAL | Status: DC
  Administered 2021-01-30 – 2021-01-31 (×2): 80 mg via ORAL
  Filled 2021-01-29 (×2): qty 2

## 2021-01-29 MED ORDER — PHENOL 1.4 % MT LIQD: 1.0000 | OROMUCOSAL | Status: DC | PRN

## 2021-01-29 MED ORDER — AMLODIPINE BESYLATE 5 MG PO TABS
5.0000 mg | ORAL_TABLET | Freq: Every day | ORAL | Status: DC
  Administered 2021-01-30 – 2021-01-31 (×2): 5 mg via ORAL
  Filled 2021-01-29 (×2): qty 1

## 2021-01-29 MED ORDER — DEXAMETHASONE SODIUM PHOSPHATE 10 MG/ML IJ SOLN
INTRAMUSCULAR | Status: AC
  Filled 2021-01-29: qty 1

## 2021-01-29 MED ORDER — SODIUM CHLORIDE 0.9 % IR SOLN
Status: DC | PRN
  Administered 2021-01-29: 1000 mL

## 2021-01-29 MED ORDER — ORAL CARE MOUTH RINSE: 15.0000 mL | Freq: Once | OROMUCOSAL | Status: AC

## 2021-01-29 MED ORDER — PROMETHAZINE HCL 25 MG/ML IJ SOLN: 6.2500 mg | INTRAMUSCULAR | Status: DC | PRN

## 2021-01-29 MED ORDER — PROPOFOL 1000 MG/100ML IV EMUL
INTRAVENOUS | Status: AC
  Filled 2021-01-29: qty 100

## 2021-01-29 MED ORDER — GABAPENTIN 300 MG PO CAPS
300.0000 mg | ORAL_CAPSULE | Freq: Three times a day (TID) | ORAL | Status: DC
  Administered 2021-01-29 – 2021-01-31 (×8): 300 mg via ORAL
  Filled 2021-01-29 (×8): qty 1

## 2021-01-29 MED ORDER — DIPHENHYDRAMINE HCL 12.5 MG/5ML PO ELIX: 12.5000 mg | ORAL_SOLUTION | ORAL | Status: DC | PRN

## 2021-01-29 MED ORDER — METOCLOPRAMIDE HCL 5 MG PO TABS: 5.0000 mg | ORAL_TABLET | Freq: Three times a day (TID) | ORAL | Status: DC | PRN

## 2021-01-29 MED ORDER — PHENYLEPHRINE HCL-NACL 20-0.9 MG/250ML-% IV SOLN
INTRAVENOUS | Status: DC | PRN
Start: 2021-01-29 — End: 2021-01-29
  Administered 2021-01-29: 50 ug/min via INTRAVENOUS

## 2021-01-29 MED ORDER — AMISULPRIDE (ANTIEMETIC) 5 MG/2ML IV SOLN
10.0000 mg | Freq: Once | INTRAVENOUS | Status: DC | PRN
Start: 2021-01-29 — End: 2021-01-29

## 2021-01-29 MED ORDER — FENTANYL CITRATE (PF) 100 MCG/2ML IJ SOLN: 25.0000 ug | INTRAMUSCULAR | Status: DC | PRN

## 2021-01-29 MED ORDER — PROPOFOL 10 MG/ML IV BOLUS
INTRAVENOUS | Status: DC | PRN
  Administered 2021-01-29: 20 mg via INTRAVENOUS

## 2021-01-29 MED ORDER — PROPOFOL 500 MG/50ML IV EMUL
INTRAVENOUS | Status: DC | PRN
  Administered 2021-01-29: 80 ug/kg/min via INTRAVENOUS

## 2021-01-29 MED ORDER — FENTANYL CITRATE (PF) 100 MCG/2ML IJ SOLN
INTRAMUSCULAR | Status: AC
  Filled 2021-01-29: qty 2

## 2021-01-29 MED ORDER — LACTATED RINGERS IV SOLN: INTRAVENOUS | Status: DC

## 2021-01-29 MED ORDER — PHENYLEPHRINE 40 MCG/ML (10ML) SYRINGE FOR IV PUSH (FOR BLOOD PRESSURE SUPPORT)
PREFILLED_SYRINGE | INTRAVENOUS | Status: AC
  Filled 2021-01-29: qty 10

## 2021-01-29 MED ORDER — RIVAROXABAN 10 MG PO TABS
10.0000 mg | ORAL_TABLET | Freq: Every day | ORAL | Status: DC
  Administered 2021-01-30 – 2021-01-31 (×2): 10 mg via ORAL
  Filled 2021-01-29 (×2): qty 1

## 2021-01-29 MED ORDER — BUPIVACAINE LIPOSOME 1.3 % IJ SUSP
INTRAMUSCULAR | Status: DC | PRN
  Administered 2021-01-29: 20 mL

## 2021-01-29 MED ORDER — FLEET ENEMA 7-19 GM/118ML RE ENEM: 1.0000 | ENEMA | Freq: Once | RECTAL | Status: DC | PRN

## 2021-01-29 MED ORDER — MENTHOL 3 MG MT LOZG: 1.0000 | LOZENGE | OROMUCOSAL | Status: DC | PRN

## 2021-01-29 MED ORDER — BUPIVACAINE IN DEXTROSE 0.75-8.25 % IT SOLN
INTRATHECAL | Status: DC | PRN
  Administered 2021-01-29: 1.6 mL via INTRATHECAL

## 2021-01-29 MED ORDER — MORPHINE SULFATE (PF) 2 MG/ML IV SOLN: 0.5000 mg | INTRAVENOUS | Status: DC | PRN

## 2021-01-29 MED ORDER — POLYETHYLENE GLYCOL 3350 17 G PO PACK: 17.0000 g | PACK | Freq: Every day | ORAL | Status: DC | PRN

## 2021-01-29 MED ORDER — ACETAMINOPHEN 10 MG/ML IV SOLN
1000.0000 mg | Freq: Four times a day (QID) | INTRAVENOUS | Status: DC
  Administered 2021-01-29: 1000 mg via INTRAVENOUS
  Filled 2021-01-29: qty 100

## 2021-01-29 MED ORDER — ONDANSETRON HCL 4 MG/2ML IJ SOLN
INTRAMUSCULAR | Status: AC
  Filled 2021-01-29: qty 2

## 2021-01-29 MED ORDER — BISACODYL 10 MG RE SUPP: 10.0000 mg | Freq: Every day | RECTAL | Status: DC | PRN

## 2021-01-29 MED ORDER — OXYCODONE HCL 5 MG PO TABS
5.0000 mg | ORAL_TABLET | ORAL | Status: DC | PRN
  Administered 2021-01-29: 5 mg via ORAL
  Administered 2021-01-29 – 2021-01-30 (×3): 10 mg via ORAL
  Administered 2021-01-31 (×2): 5 mg via ORAL
  Filled 2021-01-29 (×2): qty 1
  Filled 2021-01-29: qty 2
  Filled 2021-01-29: qty 1
  Filled 2021-01-29 (×2): qty 2

## 2021-01-29 MED ORDER — CARVEDILOL 25 MG PO TABS
25.0000 mg | ORAL_TABLET | Freq: Two times a day (BID) | ORAL | Status: DC
  Administered 2021-01-29 – 2021-01-31 (×5): 25 mg via ORAL
  Filled 2021-01-29 (×5): qty 1

## 2021-01-29 MED ORDER — CHLORHEXIDINE GLUCONATE 0.12 % MT SOLN
15.0000 mL | Freq: Once | OROMUCOSAL | Status: AC
  Administered 2021-01-29: 15 mL via OROMUCOSAL

## 2021-01-29 MED ORDER — PHENYLEPHRINE HCL (PRESSORS) 10 MG/ML IV SOLN
INTRAVENOUS | Status: AC
  Filled 2021-01-29: qty 2

## 2021-01-29 MED ORDER — ROPIVACAINE HCL 5 MG/ML IJ SOLN
INTRAMUSCULAR | Status: DC | PRN
  Administered 2021-01-29: 25 mL via PERINEURAL

## 2021-01-29 MED ORDER — SODIUM CHLORIDE (PF) 0.9 % IJ SOLN
INTRAMUSCULAR | Status: AC
  Filled 2021-01-29: qty 10

## 2021-01-29 MED ORDER — METHOCARBAMOL 500 MG PO TABS
500.0000 mg | ORAL_TABLET | Freq: Four times a day (QID) | ORAL | Status: DC | PRN
  Administered 2021-01-29 – 2021-01-30 (×3): 500 mg via ORAL
  Filled 2021-01-29 (×3): qty 1

## 2021-01-29 MED ORDER — ACETAMINOPHEN 500 MG PO TABS
1000.0000 mg | ORAL_TABLET | Freq: Four times a day (QID) | ORAL | Status: AC
  Administered 2021-01-29 – 2021-01-30 (×4): 1000 mg via ORAL
  Filled 2021-01-29 (×4): qty 2

## 2021-01-29 MED ORDER — ONDANSETRON HCL 4 MG PO TABS: 4.0000 mg | ORAL_TABLET | Freq: Four times a day (QID) | ORAL | Status: DC | PRN

## 2021-01-29 MED ORDER — TRAMADOL HCL 50 MG PO TABS: 50.0000 mg | ORAL_TABLET | Freq: Four times a day (QID) | ORAL | Status: DC | PRN

## 2021-01-29 MED ORDER — FENTANYL CITRATE (PF) 100 MCG/2ML IJ SOLN
INTRAMUSCULAR | Status: DC | PRN
  Administered 2021-01-29 (×2): 50 ug via INTRAVENOUS

## 2021-01-29 MED ORDER — ONDANSETRON HCL 4 MG/2ML IJ SOLN: 4.0000 mg | Freq: Four times a day (QID) | INTRAMUSCULAR | Status: DC | PRN

## 2021-01-29 MED ORDER — TRANEXAMIC ACID-NACL 1000-0.7 MG/100ML-% IV SOLN
1000.0000 mg | INTRAVENOUS | Status: AC
  Administered 2021-01-29: 1000 mg via INTRAVENOUS
  Filled 2021-01-29: qty 100

## 2021-01-29 MED ORDER — CEFAZOLIN SODIUM-DEXTROSE 2-4 GM/100ML-% IV SOLN
2.0000 g | INTRAVENOUS | Status: AC
  Administered 2021-01-29: 2 g via INTRAVENOUS
  Filled 2021-01-29: qty 100

## 2021-01-29 MED ORDER — METOCLOPRAMIDE HCL 5 MG/ML IJ SOLN: 5.0000 mg | Freq: Three times a day (TID) | INTRAMUSCULAR | Status: DC | PRN

## 2021-01-29 MED ORDER — MIDAZOLAM HCL 2 MG/2ML IJ SOLN
INTRAMUSCULAR | Status: AC
  Filled 2021-01-29: qty 2

## 2021-01-29 MED ORDER — DOCUSATE SODIUM 100 MG PO CAPS
100.0000 mg | ORAL_CAPSULE | Freq: Two times a day (BID) | ORAL | Status: DC
  Administered 2021-01-29 – 2021-01-31 (×5): 100 mg via ORAL
  Filled 2021-01-29 (×5): qty 1

## 2021-01-29 MED ORDER — MIDAZOLAM HCL 5 MG/5ML IJ SOLN
INTRAMUSCULAR | Status: DC | PRN
  Administered 2021-01-29: 2 mg via INTRAVENOUS

## 2021-01-29 MED ORDER — POVIDONE-IODINE 10 % EX SWAB
2.0000 "application " | Freq: Once | CUTANEOUS | Status: AC
  Administered 2021-01-29: 2 via TOPICAL

## 2021-01-29 MED ORDER — ACETAMINOPHEN 500 MG PO TABS: 1000.0000 mg | ORAL_TABLET | Freq: Once | ORAL | Status: DC

## 2021-01-29 MED ORDER — SODIUM CHLORIDE 0.9 % IV SOLN: INTRAVENOUS | Status: DC

## 2021-01-29 MED ORDER — DEXAMETHASONE SODIUM PHOSPHATE 10 MG/ML IJ SOLN
10.0000 mg | Freq: Once | INTRAMUSCULAR | Status: AC
  Administered 2021-01-30: 10 mg via INTRAVENOUS
  Filled 2021-01-29: qty 1

## 2021-01-29 MED ORDER — CELECOXIB 200 MG PO CAPS
200.0000 mg | ORAL_CAPSULE | Freq: Once | ORAL | Status: AC
  Administered 2021-01-29: 200 mg via ORAL
  Filled 2021-01-29: qty 1

## 2021-01-29 MED ORDER — METHOCARBAMOL 1000 MG/10ML IJ SOLN
500.0000 mg | Freq: Four times a day (QID) | INTRAVENOUS | Status: DC | PRN
  Filled 2021-01-29: qty 5

## 2021-01-29 MED ORDER — DEXAMETHASONE SODIUM PHOSPHATE 10 MG/ML IJ SOLN
INTRAMUSCULAR | Status: DC | PRN
  Administered 2021-01-29: 6 mg

## 2021-01-29 MED ORDER — SACUBITRIL-VALSARTAN 49-51 MG PO TABS
1.0000 | ORAL_TABLET | Freq: Two times a day (BID) | ORAL | Status: DC
  Administered 2021-01-29 – 2021-01-31 (×4): 1 via ORAL
  Filled 2021-01-29 (×6): qty 1

## 2021-01-29 MED ORDER — SODIUM CHLORIDE (PF) 0.9 % IJ SOLN
INTRAMUSCULAR | Status: DC | PRN
  Administered 2021-01-29: 60 mL

## 2021-01-29 SURGICAL SUPPLY — 55 items
ATTUNE MED DOME PAT 41 KNEE (Knees) ×1 IMPLANT
ATTUNE PS FEM LT SZ 8 CEM KNEE (Femur) ×1 IMPLANT
ATTUNE PSRP INSR SZ8 8 KNEE (Insert) ×1 IMPLANT
BAG COUNTER SPONGE SURGICOUNT (BAG) IMPLANT
BAG SPEC THK2 15X12 ZIP CLS (MISCELLANEOUS) ×1
BAG SPNG CNTER NS LX DISP (BAG)
BAG ZIPLOCK 12X15 (MISCELLANEOUS) ×2 IMPLANT
BASE TIBIAL ROT PLAT SZ 7 KNEE (Knees) IMPLANT
BLADE SAG 18X100X1.27 (BLADE) ×2 IMPLANT
BLADE SAW SGTL 11.0X1.19X90.0M (BLADE) ×2 IMPLANT
BNDG ELASTIC 6X5.8 VLCR STR LF (GAUZE/BANDAGES/DRESSINGS) ×2 IMPLANT
BOWL SMART MIX CTS (DISPOSABLE) ×2 IMPLANT
BSPLAT TIB 7 CMNT ROT PLAT STR (Knees) ×1 IMPLANT
CEMENT HV SMART SET (Cement) ×4 IMPLANT
COVER SURGICAL LIGHT HANDLE (MISCELLANEOUS) ×2 IMPLANT
CUFF TOURN SGL QUICK 34 (TOURNIQUET CUFF) ×2
CUFF TRNQT CYL 34X4.125X (TOURNIQUET CUFF) ×1 IMPLANT
DECANTER SPIKE VIAL GLASS SM (MISCELLANEOUS) ×2 IMPLANT
DRAPE U-SHAPE 47X51 STRL (DRAPES) ×2 IMPLANT
DRSG AQUACEL AG ADV 3.5X10 (GAUZE/BANDAGES/DRESSINGS) ×2 IMPLANT
DURAPREP 26ML APPLICATOR (WOUND CARE) ×2 IMPLANT
ELECT REM PT RETURN 15FT ADLT (MISCELLANEOUS) ×2 IMPLANT
GLOVE SRG 8 PF TXTR STRL LF DI (GLOVE) ×1 IMPLANT
GLOVE SURG ENC MOIS LTX SZ6.5 (GLOVE) ×2 IMPLANT
GLOVE SURG ENC MOIS LTX SZ8 (GLOVE) ×4 IMPLANT
GLOVE SURG UNDER POLY LF SZ7 (GLOVE) ×2 IMPLANT
GLOVE SURG UNDER POLY LF SZ8 (GLOVE) ×2
GLOVE SURG UNDER POLY LF SZ8.5 (GLOVE) ×2 IMPLANT
GOWN STRL REUS W/TWL LRG LVL3 (GOWN DISPOSABLE) ×4 IMPLANT
GOWN STRL REUS W/TWL XL LVL3 (GOWN DISPOSABLE) ×2 IMPLANT
HANDPIECE INTERPULSE COAX TIP (DISPOSABLE) ×2
HOLDER FOLEY CATH W/STRAP (MISCELLANEOUS) IMPLANT
IMMOBILIZER KNEE 20 (SOFTGOODS) ×2
IMMOBILIZER KNEE 20 THIGH 36 (SOFTGOODS) ×1 IMPLANT
KIT TURNOVER KIT A (KITS) ×2 IMPLANT
MANIFOLD NEPTUNE II (INSTRUMENTS) ×2 IMPLANT
NS IRRIG 1000ML POUR BTL (IV SOLUTION) ×2 IMPLANT
PACK TOTAL KNEE CUSTOM (KITS) ×2 IMPLANT
PADDING CAST COTTON 6X4 STRL (CAST SUPPLIES) ×4 IMPLANT
PENCIL SMOKE EVACUATOR (MISCELLANEOUS) ×2 IMPLANT
PIN DRILL FIX HALF THREAD (BIT) ×1 IMPLANT
PIN STEINMAN FIXATION KNEE (PIN) ×1 IMPLANT
PROTECTOR NERVE ULNAR (MISCELLANEOUS) ×2 IMPLANT
SET HNDPC FAN SPRY TIP SCT (DISPOSABLE) ×1 IMPLANT
STRIP CLOSURE SKIN 1/2X4 (GAUZE/BANDAGES/DRESSINGS) ×4 IMPLANT
SUT MNCRL AB 4-0 PS2 18 (SUTURE) ×2 IMPLANT
SUT STRATAFIX 0 PDS 27 VIOLET (SUTURE) ×2
SUT VIC AB 2-0 CT1 27 (SUTURE) ×6
SUT VIC AB 2-0 CT1 TAPERPNT 27 (SUTURE) ×3 IMPLANT
SUTURE STRATFX 0 PDS 27 VIOLET (SUTURE) ×1 IMPLANT
TIBIAL BASE ROT PLAT SZ 7 KNEE (Knees) ×2 IMPLANT
TRAY FOLEY MTR SLVR 16FR STAT (SET/KITS/TRAYS/PACK) ×2 IMPLANT
TUBE SUCTION HIGH CAP CLEAR NV (SUCTIONS) ×2 IMPLANT
WATER STERILE IRR 1000ML POUR (IV SOLUTION) ×4 IMPLANT
WRAP KNEE MAXI GEL POST OP (GAUZE/BANDAGES/DRESSINGS) ×2 IMPLANT

## 2021-01-29 NOTE — Progress Notes (Signed)
Orthopedic Tech Progress Note Patient Details:  [facility] 1945-12-01 [id number]  CPM Left Knee CPM Left Knee: On Left Knee Flexion (Degrees): 40 Left Knee Extension (Degrees): 10  Post Interventions Patient Tolerated: Well Instructions Provided: Care of device  Saul Fordyce 01/29/2021, 9:01 AM

## 2021-01-29 NOTE — Transfer of Care (Signed)
Immediate Anesthesia Transfer of Care Note  Patient: Kevin Patel  Procedure(s) Performed: TOTAL KNEE ARTHROPLASTY (Left: Knee)  Patient Location: PACU  Anesthesia Type:Spinal  Level of Consciousness: awake, alert , oriented and patient cooperative  Airway & Oxygen Therapy: Patient Spontanous Breathing and Patient connected to face mask oxygen  Post-op Assessment: Report given to RN and Post -op Vital signs reviewed and stable  Post vital signs: Reviewed and stable  Last Vitals:  Vitals Value Taken Time  BP 100/68 01/29/21 0845  Temp    Pulse 56 01/29/21 0847  Resp 19 01/29/21 0847  SpO2 100 % 01/29/21 0847  Vitals shown include unvalidated device data.  Last Pain:  Vitals:   01/29/21 0536  TempSrc: Oral  PainSc:       Patients Stated Pain Goal: 4 (01/29/21 0531)  Complications: No notable events documented.

## 2021-01-29 NOTE — Anesthesia Procedure Notes (Signed)
Spinal  Patient location during procedure: OR Start time: 01/29/2021 7:14 AM End time: 01/29/2021 7:16 AM Reason for block: surgical anesthesia Staffing Performed: resident/CRNA  Resident/CRNA: [place] Alanmichael Barmore C, CRNA Preanesthetic Checklist Completed: patient identified, IV checked, site marked, risks and benefits discussed, surgical consent, monitors and equipment checked, pre-op evaluation and timeout performed Spinal Block Patient position: sitting Prep: DuraPrep and site prepped and draped Patient monitoring: heart rate, cardiac monitor, continuous pulse ox and blood pressure Approach: midline Location: L3-4 Injection technique: single-shot Needle Needle type: Pencan  Needle gauge: 24 G Needle length: 9 cm Assessment Sensory level: T4 Events: CSF return Additional Notes IV functioning, monitors applied to pt. Expiration date of kit checked and confirmed to be in date. Sterile prep and drape, hand hygiene and sterile gloved used. Pt was positioned and spine was prepped in sterile fashion. Skin was anesthetized with lidocaine. Free flow of clear CSF obtained prior to injecting local anesthetic into CSF x 1 attempt. Spinal needle aspirated freely following injection. Needle was carefully withdrawn, and pt tolerated procedure well. Loss of motor and sensory on exam post injection.

## 2021-01-29 NOTE — Op Note (Signed)
OPERATIVE REPORT-TOTAL KNEE ARTHROPLASTY   Pre-operative diagnosis- Osteoarthritis  Left knee(s)  Post-operative diagnosis- Osteoarthritis Left knee(s)  Procedure-  Left  Total Knee Arthroplasty  Surgeon- Kevin Patel. Kevin Baumgardner, MD  Assistant- Kevin Able, PA-C   Anesthesia-   Adductor canal block and spinal  EBL-100 mL   Drains None  Tourniquet time-  Total Tourniquet Time Documented: Thigh (Left) - 37 minutes Total: Thigh (Left) - 37 minutes     Complications- None  Condition-PACU - hemodynamically stable.   Brief Clinical Note   Kevin Patel is a 75 y.o. year old male with end stage OA of his left knee with progressively worsening pain and dysfunction. He has constant pain, with activity and at rest and significant functional deficits with difficulties even with ADLs. He has had extensive non-op management including analgesics, injections of cortisone and viscosupplements, and home exercise program, but remains in significant pain with significant dysfunction. Radiographs show bone on bone arthritis medial and patellofemoral. He presents now for left Total Knee Arthroplasty.     Procedure in detail---   The patient is brought into the operating room and positioned supine on the operating table. After successful administration of   Adductor canal block and spinal ,   a tourniquet is placed high on the  Left thigh(s) and the lower extremity is prepped and draped in the usual sterile fashion. Time out is performed by the operating team and then the  Left lower extremity is wrapped in Esmarch, knee flexed and the tourniquet inflated to 300 mmHg.       A midline incision is made with a ten blade through the subcutaneous tissue to the level of the extensor mechanism. A fresh blade is used to make a medial parapatellar arthrotomy. Soft tissue over the proximal medial tibia is subperiosteally elevated to the joint line with a knife and into the semimembranosus bursa with a Cobb elevator.  Soft tissue over the proximal lateral tibia is elevated with attention being paid to avoiding the patellar tendon on the tibial tubercle. The patella is everted, knee flexed 90 degrees and the ACL and PCL are removed. Findings are bone on bone all 3 compartments with massive global osteophytes.        The drill is used to create a starting hole in the distal femur and the canal is thoroughly irrigated with sterile saline to remove the fatty contents. The 5 degree Left  valgus alignment guide is placed into the femoral canal and the distal femoral cutting block is pinned to remove 9 mm off the distal femur. Resection is made with an oscillating saw.      The tibia is subluxed forward and the menisci are removed. The extramedullary alignment guide is placed referencing proximally at the medial aspect of the tibial tubercle and distally along the second metatarsal axis and tibial crest. The block is pinned to remove 64mm off the more deficient medial  side. Resection is made with an oscillating saw. Size 7is the most appropriate size for the tibia and the proximal tibia is prepared with the modular drill and keel punch for that size.      The femoral sizing guide is placed and size 8 is most appropriate. Rotation is marked off the epicondylar axis and confirmed by creating a rectangular flexion gap at 90 degrees. The size 8 cutting block is pinned in this rotation and the anterior, posterior and chamfer cuts are made with the oscillating saw. The intercondylar block is then placed and  that cut is made.      Trial size 7 tibial component, trial size 8 posterior stabilized femur and a 8  mm posterior stabilized rotating platform insert trial is placed. Full extension is achieved with excellent varus/valgus and anterior/posterior balance throughout full range of motion. The patella is everted and thickness measured to be 27  mm. Free hand resection is taken to 15 mm, a 41 template is placed, lug holes are drilled,  trial patella is placed, and it tracks normally. Osteophytes are removed off the posterior femur with the trial in place. All trials are removed and the cut bone surfaces prepared with pulsatile lavage. Cement is mixed and once ready for implantation, the size 7 tibial implant, size  8 posterior stabilized femoral component, and the size 41 patella are cemented in place and the patella is held with the clamp. The trial insert is placed and the knee held in full extension. The Exparel (20 ml mixed with 60 ml saline) is injected into the extensor mechanism, posterior capsule, medial and lateral gutters and subcutaneous tissues.  All extruded cement is removed and once the cement is hard the permanent 8 mm posterior stabilized rotating platform insert is placed into the tibial tray.      The wound is copiously irrigated with saline solution and the extensor mechanism closed with # 0 Stratofix suture. The tourniquet is released for a total tourniquet time of 37  minutes. Flexion against gravity is 140 degrees and the patella tracks normally. Subcutaneous tissue is closed with 2.0 vicryl and subcuticular with running 4.0 Monocryl. The incision is cleaned and dried and steri-strips and a bulky sterile dressing are applied. The limb is placed into a knee immobilizer and the patient is awakened and transported to recovery in stable condition.      Please note that a surgical assistant was a medical necessity for this procedure in order to perform it in a safe and expeditious manner. Surgical assistant was necessary to retract the ligaments and vital neurovascular structures to prevent injury to them and also necessary for proper positioning of the limb to allow for anatomic placement of the prosthesis.   Kevin Patel Kevin Yamaguchi, MD    01/29/2021, 8:20 AM

## 2021-01-29 NOTE — Evaluation (Signed)
Physical Therapy Evaluation Patient Details Name: Kevin Patel MRN: [id number] DOB: 01/29/1946 Today's Date: 01/29/2021   History of Present Illness  Patient is 75 y.o. male s/p Lt TKA on 01/29/21 with PMH significant for HTN, CAD, DM, HLD, MI in 1998, ICD placement, OA.    Clinical Impression  Kevin Patel is a 75 y.o. male POD 0 s/p Lt TKA. Patient reports independence with mobility at baseline. Patient is now limited by functional impairments (see PT problem list below) and requires min assist for transfers and gait with RW. Patient was able to ambulate ~45 feet with RW and min assist to maintain safe walker position and keep walker on ground. Patient instructed in exercise to facilitate ROM and circulation to manage edema and reduce risk of DVT Patient will benefit from continued skilled PT interventions to address impairments and progress towards PLOF. Acute PT will follow to progress mobility and stair training in preparation for safe discharge home.     Follow Up Recommendations Follow surgeon's recommendation for DC plan and follow-up therapies;Outpatient PT    Equipment Recommendations  None recommended by PT    Recommendations for Other Services       Precautions / Restrictions Precautions Precautions: Knee Restrictions Weight Bearing Restrictions: No Other Position/Activity Restrictions: WBAT      Mobility  Bed Mobility Overal bed mobility: Needs Assistance Bed Mobility: Supine to Sit     Supine to sit: HOB elevated;Min guard     General bed mobility comments: cues to use bed rail, pt taking some extra time to sit up EOB. guarding for safety.    Transfers Overall transfer level: Needs assistance Equipment used: Rolling walker (2 wheeled) Transfers: Sit to/from Stand Sit to Stand: Min assist;From elevated surface         General transfer comment: cues for technique with RW for power-up.  pt initiated and assist to steady with rise needed. bed elevated to  accomodate pt height.  Ambulation/Gait Ambulation/Gait assistance: Min assist Gait Distance (Feet): 45 Feet Assistive device: Rolling walker (2 wheeled) Gait Pattern/deviations: Step-to pattern;Decreased stride length;Decreased weight shift to left Gait velocity: decr   General Gait Details: cues for step to pattern abnd position of walker, no overt LOB noted or buckling at Lt knee. Min assist initially to maintain safe position to RW.  Stairs            Wheelchair Mobility    Modified Rankin (Stroke Patients Only)       Balance Overall balance assessment: Needs assistance Sitting-balance support: Feet supported Sitting balance-Leahy Scale: Good     Standing balance support: During functional activity;Bilateral upper extremity supported Standing balance-Leahy Scale: Fair                               Pertinent Vitals/Pain Pain Assessment: 0-10 Pain Score: 7  Pain Location: Lt knee Pain Descriptors / Indicators: Aching;Discomfort;Sore Pain Intervention(s): Limited activity within patient's tolerance;Monitored during session;Repositioned;RN gave pain meds during session;Ice applied    Home Living Family/patient expects to be discharged to:: Private residence Living Arrangements: Spouse/significant other Available Help at Discharge: Family Type of Home: House Home Access: Stairs to enter Entrance Stairs-Rails: None Entrance Stairs-Number of Steps: 2 Home Layout: One level Home Equipment: Environmental consultant - 2 wheels;Cane - single point;Bedside commode;Shower seat - built in;Grab bars - tub/shower;Hand held shower head (vanity next to toilet)      Prior Function Level of Independence: Independent  Comments: enjoys bowling     Hand Dominance   Dominant Hand: Right    Extremity/Trunk Assessment   Upper Extremity Assessment Upper Extremity Assessment: Overall WFL for tasks assessed    Lower Extremity Assessment Lower Extremity Assessment:  Overall WFL for tasks assessed;LLE deficits/detail LLE Deficits / Details: good quad activaiton, no extensor lag with SLR LLE Sensation: WNL LLE Coordination: WNL    Cervical / Trunk Assessment Cervical / Trunk Assessment: Normal  Communication   Communication: No difficulties  Cognition Arousal/Alertness: Awake/alert Behavior During Therapy: WFL for tasks assessed/performed Overall Cognitive Status: Within Functional Limits for tasks assessed                                        General Comments      Exercises Total Joint Exercises Ankle Circles/Pumps: AROM;Both;20 reps;Seated Quad Sets: AROM;10 reps;Seated;Left Heel Slides: AROM;10 reps;Seated;Left   Assessment/Plan    PT Assessment Patient needs continued PT services  PT Problem List Decreased strength;Decreased range of motion;Decreased activity tolerance;Decreased balance;Decreased mobility;Decreased knowledge of use of DME;Decreased knowledge of precautions;Pain       PT Treatment Interventions DME instruction;Gait training;Stair training;Functional mobility training;Therapeutic activities;Therapeutic exercise;Balance training;Patient/family education    PT Goals (Current goals can be found in the Care Plan section)  Acute Rehab PT Goals Patient Stated Goal: get independent again and go bowling PT Goal Formulation: With patient Time For Goal Achievement: 02/05/21 Potential to Achieve Goals: Good    Frequency 7X/week   Barriers to discharge        Co-evaluation               AM-PAC PT "6 Clicks" Mobility  Outcome Measure Help needed turning from your back to your side while in a flat bed without using bedrails?: A Little Help needed moving from lying on your back to sitting on the side of a flat bed without using bedrails?: A Little Help needed moving to and from a bed to a chair (including a wheelchair)?: A Little Help needed standing up from a chair using your arms (e.g., wheelchair  or bedside chair)?: A Little Help needed to walk in hospital room?: A Little Help needed climbing 3-5 steps with a railing? : A Lot 6 Click Score: 17    End of Session         PT Visit Diagnosis: Muscle weakness (generalized) (M62.81);Difficulty in walking, not elsewhere classified (R26.2)    Time: 5329-9242 PT Time Calculation (min) (ACUTE ONLY): 26 min   Charges:   PT Evaluation $PT Eval Low Complexity: 1 Low PT Treatments $Gait Training: 8-22 mins        Wynn Maudlin, DPT Acute Rehabilitation Services Office [phone] Pager [phone]   Anitra Lauth 01/29/2021, 12:28 PM

## 2021-01-29 NOTE — Interval H&P Note (Signed)
History and Physical Interval Note:  01/29/2021 6:29 AM  Kevin Patel  has presented today for surgery, with the diagnosis of left knee osteoarthritis.  The various methods of treatment have been discussed with the patient and family. After consideration of risks, benefits and other options for treatment, the patient has consented to  Procedure(s): TOTAL KNEE ARTHROPLASTY (Left) as a surgical intervention.  The patient's history has been reviewed, patient examined, no change in status, stable for surgery.  I have reviewed the patient's chart and labs.  Questions were answered to the patient's satisfaction.     Homero Fellers Raynetta Osterloh

## 2021-01-29 NOTE — Anesthesia Procedure Notes (Signed)
Anesthesia Regional Block: Adductor canal block   Pre-Anesthetic Checklist: , timeout performed,  Correct Patient, Correct Site, Correct Laterality,  Correct Procedure, Correct Position, site marked,  Risks and benefits discussed,  Surgical consent,  Pre-op evaluation,  At surgeon's request and post-op pain management  Laterality: Left  Prep: chloraprep       Needles:  Injection technique: Single-shot  Needle Type: Stimulator Needle - 80     Needle Length: 10cm  Needle Gauge: 21     Additional Needles:   Narrative:  Start time: 01/29/2021 6:47 AM End time: 01/29/2021 6:57 AM Injection made incrementally with aspirations every 5 mL.  Performed by: Personally  Anesthesiologist: Heather Roberts, MD

## 2021-01-29 NOTE — Plan of Care (Signed)
  Problem: Activity: Goal: Risk for activity intolerance will decrease Outcome: Progressing   Problem: Activity: Goal: Ability to avoid complications of mobility impairment will improve Outcome: Progressing   Problem: Pain Management: Goal: Pain level will decrease with appropriate interventions Outcome: Progressing   

## 2021-01-29 NOTE — Discharge Instructions (Addendum)
 Kevin Aluisio, MD Total Joint Specialist [facility] [address] [place], [place] [address] [phone]  TOTAL KNEE REPLACEMENT POSTOPERATIVE DIRECTIONS    Knee Rehabilitation, Guidelines Following Surgery  Results after knee surgery are often greatly improved when you follow the exercise, range of motion and muscle strengthening exercises prescribed by your doctor. Safety measures are also important to protect the knee from further injury. If any of these exercises cause you to have increased pain or swelling in your knee joint, decrease the amount until you are comfortable again and slowly increase them. If you have problems or questions, call your caregiver or physical therapist for advice.   BLOOD CLOT PREVENTION Take a 10 mg Xarelto once a day for three weeks following surgery. Then resume one 81 mg aspirin once a day. You may resume your vitamins/supplements once you have discontinued the Xarelto. Do not take any NSAIDs (Advil, Aleve, Ibuprofen, Meloxicam, etc.) until you have discontinued the Xarelto.   HOME CARE INSTRUCTIONS  Remove items at home which could result in a fall. This includes throw rugs or furniture in walking pathways.  ICE to the affected knee as much as tolerated. Icing helps control swelling. If the swelling is well controlled you will be more comfortable and rehab easier. Continue to use ice on the knee for pain and swelling from surgery. You may notice swelling that will progress down to the foot and ankle. This is normal after surgery. Elevate the leg when you are not up walking on it.    Continue to use the breathing machine which will help keep your temperature down. It is common for your temperature to cycle up and down following surgery, especially at night when you are not up moving around and exerting yourself. The breathing machine keeps your lungs expanded and your temperature down. Do not place pillow under the operative  knee, focus on keeping the knee straight while resting  DIET You may resume your previous home diet once you are discharged from the hospital.  DRESSING / WOUND CARE / SHOWERING Keep your bulky bandage on for 2 days. On the third post-operative day you may remove the Ace bandage and gauze. There is a waterproof adhesive bandage on your skin which will stay in place until your first follow-up appointment. Once you remove this you will not need to place another bandage You may begin showering 3 days following surgery, but do not submerge the incision under water.  ACTIVITY For the first 5 days, the key is rest and control of pain and swelling Do your home exercises twice a day starting on post-operative day 3. On the days you go to physical therapy, just do the home exercises once that day. You should rest, ice and elevate the leg for 50 minutes out of every hour. Get up and walk/stretch for 10 minutes per hour. After 5 days you can increase your activity slowly as tolerated. Walk with your walker as instructed. Use the walker until you are comfortable transitioning to a cane. Walk with the cane in the opposite hand of the operative leg. You may discontinue the cane once you are comfortable and walking steadily. Avoid periods of inactivity such as sitting longer than an hour when not asleep. This helps prevent blood clots.  You may discontinue the knee immobilizer once you are able to perform a straight leg raise while lying down. You may resume a sexual relationship in one month or when given the OK by your   doctor.  You may return to work once you are cleared by your doctor.  Do not drive a car for 6 weeks or until released by your surgeon.  Do not drive while taking narcotics.  TED HOSE STOCKINGS Wear the elastic stockings on both legs for three weeks following surgery during the day. You may remove them at night for sleeping.  WEIGHT BEARING Weight bearing as tolerated with assist device  (walker, cane, etc) as directed, use it as long as suggested by your surgeon or therapist, typically at least 4-6 weeks.  POSTOPERATIVE CONSTIPATION PROTOCOL Constipation - defined medically as fewer than three stools per week and severe constipation as less than one stool per week.  One of the most common issues patients have following surgery is constipation.  Even if you have a regular bowel pattern at home, your normal regimen is likely to be disrupted due to multiple reasons following surgery.  Combination of anesthesia, postoperative narcotics, change in appetite and fluid intake all can affect your bowels.  In order to avoid complications following surgery, here are some recommendations in order to help you during your recovery period.  Colace (docusate) - Pick up an over-the-counter form of Colace or another stool softener and take twice a day as long as you are requiring postoperative pain medications.  Take with a full glass of water daily.  If you experience loose stools or diarrhea, hold the colace until you stool forms back up. If your symptoms do not get better within 1 week or if they get worse, check with your doctor. Dulcolax (bisacodyl) - Pick up over-the-counter and take as directed by the product packaging as needed to assist with the movement of your bowels.  Take with a full glass of water.  Use this product as needed if not relieved by Colace only.  MiraLax (polyethylene glycol) - Pick up over-the-counter to have on hand. MiraLax is a solution that will increase the amount of water in your bowels to assist with bowel movements.  Take as directed and can mix with a glass of water, juice, soda, coffee, or tea. Take if you go more than two days without a movement. Do not use MiraLax more than once per day. Call your doctor if you are still constipated or irregular after using this medication for 7 days in a row.  If you continue to have problems with postoperative constipation, please  contact the office for further assistance and recommendations.  If you experience "the worst abdominal pain ever" or develop nausea or vomiting, please contact the office immediatly for further recommendations for treatment.  ITCHING If you experience itching with your medications, try taking only a single pain pill, or even half a pain pill at a time.  You can also use Benadryl over the counter for itching or also to help with sleep.   MEDICATIONS See your medication summary on the "After Visit Summary" that the nursing staff will review with you prior to discharge.  You may have some home medications which will be placed on hold until you complete the course of blood thinner medication.  It is important for you to complete the blood thinner medication as prescribed by your surgeon.  Continue your approved medications as instructed at time of discharge.  PRECAUTIONS If you experience chest pain or shortness of breath - call 911 immediately for transfer to the hospital emergency department.  If you develop a fever greater that 101 F, purulent drainage from wound, increased redness or   drainage from wound, foul odor from the wound/dressing, or calf pain - CONTACT YOUR SURGEON.                                                   FOLLOW-UP APPOINTMENTS Make sure you keep all of your appointments after your operation with your surgeon and caregivers. You should call the office at the above phone number and make an appointment for approximately two weeks after the date of your surgery or on the date instructed by your surgeon outlined in the "After Visit Summary".  RANGE OF MOTION AND STRENGTHENING EXERCISES  Rehabilitation of the knee is important following a knee injury or an operation. After just a few days of immobilization, the muscles of the thigh which control the knee become weakened and shrink (atrophy). Knee exercises are designed to build up the tone and strength of the thigh muscles and to  improve knee motion. Often times heat used for twenty to thirty minutes before working out will loosen up your tissues and help with improving the range of motion but do not use heat for the first two weeks following surgery. These exercises can be done on a training (exercise) mat, on the floor, on a table or on a bed. Use what ever works the best and is most comfortable for you Knee exercises include:  Leg Lifts - While your knee is still immobilized in a splint or cast, you can do straight leg raises. Lift the leg to 60 degrees, hold for 3 sec, and slowly lower the leg. Repeat 10-20 times 2-3 times daily. Perform this exercise against resistance later as your knee gets better.  Quad and Hamstring Sets - Tighten up the muscle on the front of the thigh (Quad) and hold for 5-10 sec. Repeat this 10-20 times hourly. Hamstring sets are done by pushing the foot backward against an object and holding for 5-10 sec. Repeat as with quad sets.  Leg Slides: Lying on your back, slowly slide your foot toward your buttocks, bending your knee up off the floor (only go as far as is comfortable). Then slowly slide your foot back down until your leg is flat on the floor again. Angel Wings: Lying on your back spread your legs to the side as far apart as you can without causing discomfort.  A rehabilitation program following serious knee injuries can speed recovery and prevent re-injury in the future due to weakened muscles. Contact your doctor or a physical therapist for more information on knee rehabilitation.   POST-OPERATIVE OPIOID TAPER INSTRUCTIONS: It is important to wean off of your opioid medication as soon as possible. If you do not need pain medication after your surgery it is ok to stop day one. Opioids include: Codeine, Hydrocodone(Norco, Vicodin), Oxycodone(Percocet, oxycontin) and hydromorphone amongst others.  Long term and even short term use of opiods can cause: Increased pain  response Dependence Constipation Depression Respiratory depression And more.  Withdrawal symptoms can include Flu like symptoms Nausea, vomiting And more Techniques to manage these symptoms Hydrate well Eat regular healthy meals Stay active Use relaxation techniques(deep breathing, meditating, yoga) Do Not substitute Alcohol to help with tapering If you have been on opioids for less than two weeks and do not have pain than it is ok to stop all together.  Plan to wean off of opioids This plan   should start within one week post op of your joint replacement. Maintain the same interval or time between taking each dose and first decrease the dose.  Cut the total daily intake of opioids by one tablet each day Next start to increase the time between doses. The last dose that should be eliminated is the evening dose.   IF YOU ARE TRANSFERRED TO A SKILLED REHAB FACILITY If the patient is transferred to a skilled rehab facility following release from the hospital, a list of the current medications will be sent to the facility for the patient to continue.  When discharged from the skilled rehab facility, please have the facility set up the patient's Home Health Physical Therapy prior to being released. Also, the skilled facility will be responsible for providing the patient with their medications at time of release from the facility to include their pain medication, the muscle relaxants, and their blood thinner medication. If the patient is still at the rehab facility at time of the two week follow up appointment, the skilled rehab facility will also need to assist the patient in arranging follow up appointment in our office and any transportation needs.  MAKE SURE YOU:  Understand these instructions.  Get help right away if you are not doing well or get worse.   DENTAL ANTIBIOTICS:  In most cases prophylactic antibiotics for Dental procdeures after total joint surgery are not  necessary.  Exceptions are as follows:  1. History of prior total joint infection  2. Severely immunocompromised (Organ Transplant, cancer chemotherapy, Rheumatoid biologic meds such as Humera)  3. Poorly controlled diabetes (A1C &gt; 8.0, blood glucose over 200)  If you have one of these conditions, contact your surgeon for an antibiotic prescription, prior to your dental procedure.    Pick up stool softner and laxative for home use following surgery while on pain medications. Do not submerge incision under water. Please use good hand washing techniques while changing dressing each day. May shower starting three days after surgery. Please use a clean towel to pat the incision dry following showers. Continue to use ice for pain and swelling after surgery. Do not use any lotions or creams on the incision until instructed by your surgeon.      Information on my medicine - XARELTO (Rivaroxaban)  Why was Xarelto prescribed for you? Xarelto was prescribed for you to reduce the risk of blood clots forming after orthopedic surgery. The medical term for these abnormal blood clots is venous thromboembolism (VTE).  What do you need to know about xarelto ? Take your Xarelto ONCE DAILY at the same time every day. You may take it either with or without food.  If you have difficulty swallowing the tablet whole, you may crush it and mix in applesauce just prior to taking your dose.  Take Xarelto exactly as prescribed by your doctor and DO NOT stop taking Xarelto without talking to the doctor who prescribed the medication.  Stopping without other VTE prevention medication to take the place of Xarelto may increase your risk of developing a clot.  After discharge, you should have regular check-up appointments with your healthcare provider that is prescribing your Xarelto.    What do you do if you miss a dose? If you miss a dose, take it as soon as you remember on the same day then  continue your regularly scheduled once daily regimen the next day. Do not take two doses of Xarelto on the same day.   Important Safety Information   A possible side effect of Xarelto is bleeding. You should call your healthcare provider right away if you experience any of the following: Bleeding from an injury or your nose that does not stop. Unusual colored urine (red or dark brown) or unusual colored stools (red or black). Unusual bruising for unknown reasons. A serious fall or if you hit your head (even if there is no bleeding).  Some medicines may interact with Xarelto and might increase your risk of bleeding while on Xarelto. To help avoid this, consult your healthcare provider or pharmacist prior to using any new prescription or non-prescription medications, including herbals, vitamins, non-steroidal anti-inflammatory drugs (NSAIDs) and supplements.  This website has more information on Xarelto: www.xarelto.com.   

## 2021-01-29 NOTE — Anesthesia Postprocedure Evaluation (Signed)
Anesthesia Post Note  Patient: Kevin Patel  Procedure(s) Performed: TOTAL KNEE ARTHROPLASTY (Left: Knee)     Patient location during evaluation: PACU Anesthesia Type: Spinal Level of consciousness: awake and alert Pain management: pain level controlled Vital Signs Assessment: post-procedure vital signs reviewed and stable Respiratory status: spontaneous breathing and respiratory function stable Cardiovascular status: blood pressure returned to baseline and stable Postop Assessment: spinal receding Anesthetic complications: no   No notable events documented.  Last Vitals:  Vitals:   01/29/21 0945 01/29/21 1006  BP: 127/80 132/80  Pulse: 61 (!) 56  Resp: 15 14  Temp: (!) 35.7 C   SpO2: 100% 98%    Last Pain:  Vitals:   01/29/21 1006  TempSrc: Oral  PainSc: 0-No pain                 Cristella Stiver DANIEL

## 2021-01-30 ENCOUNTER — Encounter (HOSPITAL_COMMUNITY): Payer: Self-pay | Admitting: Orthopedic Surgery

## 2021-01-30 LAB — CBC
HCT: 35.7 % — ABNORMAL LOW (ref 39.0–52.0)
Hemoglobin: 12.2 g/dL — ABNORMAL LOW (ref 13.0–17.0)
MCH: 34.3 pg — ABNORMAL HIGH (ref 26.0–34.0)
MCHC: 34.2 g/dL (ref 30.0–36.0)
MCV: 100.3 fL — ABNORMAL HIGH (ref 80.0–100.0)
Platelets: 142 10*3/uL — ABNORMAL LOW (ref 150–400)
RBC: 3.56 MIL/uL — ABNORMAL LOW (ref 4.22–5.81)
RDW: 12.4 % (ref 11.5–15.5)
WBC: 16.6 10*3/uL — ABNORMAL HIGH (ref 4.0–10.5)
nRBC: 0 % (ref 0.0–0.2)

## 2021-01-30 LAB — BASIC METABOLIC PANEL
Anion gap: 7 (ref 5–15)
BUN: 21 mg/dL (ref 8–23)
CO2: 25 mmol/L (ref 22–32)
Calcium: 8.8 mg/dL — ABNORMAL LOW (ref 8.9–10.3)
Chloride: 105 mmol/L (ref 98–111)
Creatinine, Ser: 1.14 mg/dL (ref 0.61–1.24)
GFR, Estimated: >60 mL/min (ref >60)
Glucose, Bld: 208 mg/dL — ABNORMAL HIGH (ref 70–99)
Potassium: 4.3 mmol/L (ref 3.5–5.1)
Sodium: 137 mmol/L (ref 135–145)

## 2021-01-30 MED ORDER — RIVAROXABAN 10 MG PO TABS: 10.0000 mg | ORAL_TABLET | Freq: Every day | ORAL | 0 refills | Status: AC

## 2021-01-30 MED ORDER — TRAMADOL HCL 50 MG PO TABS: 50.0000 mg | ORAL_TABLET | Freq: Four times a day (QID) | ORAL | 0 refills | Status: DC | PRN

## 2021-01-30 MED ORDER — OXYCODONE HCL 5 MG PO TABS: 5.0000 mg | ORAL_TABLET | Freq: Four times a day (QID) | ORAL | 0 refills | Status: DC | PRN

## 2021-01-30 MED ORDER — METHOCARBAMOL 500 MG PO TABS: 500.0000 mg | ORAL_TABLET | Freq: Four times a day (QID) | ORAL | 0 refills | Status: DC | PRN

## 2021-01-30 MED ORDER — GABAPENTIN 300 MG PO CAPS: ORAL_CAPSULE | ORAL | 0 refills | Status: DC

## 2021-01-30 NOTE — Plan of Care (Signed)
  Problem: Education: Goal: Knowledge of General Education information will improve Description Including pain rating scale, medication(s)/side effects and non-pharmacologic comfort measures Outcome: Progressing   Problem: Nutrition: Goal: Adequate nutrition will be maintained Outcome: Progressing   Problem: Pain Managment: Goal: General experience of comfort will improve Outcome: Progressing   

## 2021-01-30 NOTE — Progress Notes (Signed)
Physical Therapy Treatment Patient Details Name: Kevin Patel MRN: [id number] DOB: 11/07/1945 Today's Date: 01/30/2021    History of Present Illness Patient is 75 y.o. male s/p Lt TKA on 01/29/21 with PMH significant for HTN, CAD, DM, HLD, MI in 1998, ICD placement, OA.    PT Comments    Patient making good progress with mobility. He continues to require min assist for power up to stand and cues for safe walker management with gait. Patient progressed to min guard assist for safety with gait throughout. EOS educated pt on HEP for ROM, strength, and circulation. Will follow up for PM session to progress gait and stair training and perform family training.    Follow Up Recommendations  Follow surgeon's recommendation for DC plan and follow-up therapies;Outpatient PT     Equipment Recommendations  None recommended by PT    Recommendations for Other Services       Precautions / Restrictions Precautions Precautions: Knee;Fall Restrictions Weight Bearing Restrictions: No Other Position/Activity Restrictions: WBAT    Mobility  Bed Mobility Overal bed mobility: Needs Assistance Bed Mobility: Supine to Sit     Supine to sit: HOB elevated;Supervision     General bed mobility comments: supervision and extra time to sit up EOB, slighlty elevated HOB    Transfers Overall transfer level: Needs assistance Equipment used: Rolling walker (2 wheeled) Transfers: Sit to/from Stand Sit to Stand: Min assist;From elevated surface         General transfer comment: bed slightly elevated and cues for hand placement to improve power up, min assist needed to fully rise from EOB to walker.  Ambulation/Gait Ambulation/Gait assistance: Min assist;Min guard Gait Distance (Feet): 120 Feet Assistive device: Rolling walker (2 wheeled) Gait Pattern/deviations: Step-to pattern;Decreased stride length;Decreased weight shift to left Gait velocity: decr   General Gait Details: intermittent cues  to keep walker close at start of gait. pt progressing to min guard throughout with improved step length and safe positioning of RW, no overt LOB noted.   Stairs             Wheelchair Mobility    Modified Rankin (Stroke Patients Only)       Balance Overall balance assessment: Needs assistance Sitting-balance support: Feet supported Sitting balance-Leahy Scale: Good     Standing balance support: During functional activity;Bilateral upper extremity supported Standing balance-Leahy Scale: Fair                              Cognition Arousal/Alertness: Awake/alert Behavior During Therapy: WFL for tasks assessed/performed Overall Cognitive Status: Within Functional Limits for tasks assessed                                        Exercises Total Joint Exercises Ankle Circles/Pumps: AROM;Both;20 reps;Seated Quad Sets: AROM;10 reps;Seated;Left Heel Slides: AROM;10 reps;Seated;Left Hip ABduction/ADduction: AROM;10 reps;Seated;Left    General Comments        Pertinent Vitals/Pain Pain Assessment: 0-10 Pain Score: 2  Pain Location: Lt knee Pain Descriptors / Indicators: Aching;Discomfort;Sore    Home Living                      Prior Function            PT Goals (current goals can now be found in the care plan section) Acute Rehab PT Goals Patient Stated  Goal: get independent again and go bowling PT Goal Formulation: With patient Time For Goal Achievement: 02/05/21 Potential to Achieve Goals: Good Progress towards PT goals: Progressing toward goals    Frequency    7X/week      PT Plan Current plan remains appropriate    Co-evaluation              AM-PAC PT "6 Clicks" Mobility   Outcome Measure  Help needed turning from your back to your side while in a flat bed without using bedrails?: A Little Help needed moving from lying on your back to sitting on the side of a flat bed without using bedrails?: A  Little Help needed moving to and from a bed to a chair (including a wheelchair)?: A Little Help needed standing up from a chair using your arms (e.g., wheelchair or bedside chair)?: A Little Help needed to walk in hospital room?: A Little Help needed climbing 3-5 steps with a railing? : A Lot 6 Click Score: 17    End of Session Equipment Utilized During Treatment: Gait belt Activity Tolerance: Patient tolerated treatment well Patient left: in chair;with call bell/phone within reach;with chair alarm set Nurse Communication: Mobility status PT Visit Diagnosis: Muscle weakness (generalized) (M62.81);Difficulty in walking, not elsewhere classified (R26.2)      01/30/21 1005  PT Time Calculation  PT Start Time (ACUTE ONLY) 1006  PT Stop Time (ACUTE ONLY) 1034  PT Time Calculation (min) (ACUTE ONLY) 28 min  PT General Charges  $$ ACUTE PT VISIT 1 Visit  PT Treatments  $Gait Training 8-22 mins  $Therapeutic Exercise 8-22 mins                      Wynn Maudlin, DPT Acute Rehabilitation Services Office [phone] Pager [phone]    Anitra Lauth 01/30/2021, 6:53 PM

## 2021-01-30 NOTE — Progress Notes (Signed)
Orthopedic Tech Progress Note Patient Details:  Kevin Patel 1945/12/14 [id number]  Patient ID: [name], male   DOB: 1945/08/11, 75 y.o.   MRN: [id number]  Saul Fordyce 01/30/2021, 11:05 AMcpm pickup

## 2021-01-30 NOTE — Progress Notes (Signed)
Subjective: 1 Day Post-Op Procedure(s) (LRB): TOTAL KNEE ARTHROPLASTY (Left) Patient reports pain as mild.   Patient seen in rounds by Dr. Lequita Halt. Patient is doing well this AM. Had issues with bleeding from his bandage yesterday. Improved this morning. Denies chest pain or SOB. Foley catheter removed. We will continue therapy today, ambulated 45' yesterday.   Objective: Vital signs in last 24 hours: Temp:  [96.2 F (35.7 C)-97.8 F (36.6 C)] 96.7 F (35.9 C) (08/23 0451) Pulse Rate:  [51-77] 63 (08/23 0451) Resp:  [12-20] 17 (08/23 0451) BP: (100-132)/(68-89) 116/68 (08/23 0451) SpO2:  [96 %-100 %] 96 % (08/23 0451)  Intake/Output from previous day:  Intake/Output Summary (Last 24 hours) at 01/30/2021 0739 Last data filed at 01/30/2021 4010 Gross per 24 hour  Intake 2806.07 ml  Output 2700 ml  Net 106.07 ml     Intake/Output this shift: No intake/output data recorded.  Labs: Recent Labs    01/30/21 0318  HGB 12.2*   Recent Labs    01/30/21 0318  WBC 16.6*  RBC 3.56*  HCT 35.7*  PLT 142*   Recent Labs    01/30/21 0318  NA 137  K 4.3  CL 105  CO2 25  BUN 21  CREATININE 1.14  GLUCOSE 208*  CALCIUM 8.8*   No results for input(s): LABPT, INR in the last 72 hours.  Exam: General - Patient is Alert and Oriented Extremity - Neurologically intact Neurovascular intact Sensation intact distally Dorsiflexion/Plantar flexion intact Dressing - dressing C/D/I Motor Function - intact, moving foot and toes well on exam.   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   Arthritis    knees   Chronic systolic HF (heart failure) (HCC)    Coronary artery disease    Diabetes mellitus with coincident hypertension (HCC) 09/09/2016   History of kidney stones    HTN (hypertension)    Hyperlipidemia    Ischemic cardiomyopathy    MI (myocardial infarction) (HCC) 1998   2018,   Mural thrombus of heart     on coumadin until follow up echo with contrast in Sept 13, 2012 which showed no thrombus and coumadin was d/c   Pre-diabetes     Assessment/Plan: 1 Day Post-Op Procedure(s) (LRB): TOTAL KNEE ARTHROPLASTY (Left) Principal Problem:   OA (osteoarthritis) of knee Active Problems:   Primary osteoarthritis of left knee  Estimated body mass index is 32.34 kg/m as calculated from the following:   Height as of this encounter: 6' 3.5" (1.918 m).   Weight as of this encounter: 118.9 kg. Advance diet Up with therapy D/C IV fluids   Patient's anticipated LOS is less than 2 midnights, meeting these requirements: - Younger than 88 - Lives within 1 hour of care - Has a competent adult at home to recover with post-op recover - NO history of  - Chronic pain requiring opiods  - Diabetes  - Coronary Artery Disease  - Heart failure  - Heart attack  - Stroke  - DVT/VTE  - Cardiac arrhythmia  - Respiratory Failure/COPD  - Renal failure  - Anemia  - Advanced Liver disease  DVT Prophylaxis - Xarelto Weight bearing as tolerated. Continue therapy.  Plan is to go Home after hospital stay. Plan for discharge later today if progresses with therapy and meeting his goals. Scheduled for OPPT at [facility] Follow-up in the office in 2 weeks  The PDMP database was reviewed today prior to any opioid medications being  prescribed to this patient.  Arther Abbott, PA-C Orthopedic Surgery [phone] 01/30/2021, 7:39 AM

## 2021-01-30 NOTE — Progress Notes (Signed)
Physical Therapy Treatment Patient Details Name: Kevin Patel MRN: [id number] DOB: 03/03/1946 Today's Date: 01/30/2021    History of Present Illness Patient is 75 y.o. male s/p Lt TKA on 01/29/21 with PMH significant for HTN, CAD, DM, HLD, MI in 1998, ICD placement, OA.    PT Comments    Patient progressing well with Acute PT and demonstrated improved recall for safe technique to complete sit<>stands and safe walker management with gait. Pt's daughter and wife present and daughter provided guarding with gait and stair training per supervision and instruction of therapist. Pt completed reverse step up technique with RW and no overt LOB noted. Reviewed HEP and addressed questions in preparation for discharge home. Pt is mobilizing at safe level to return home with assist from family; acute PT will continue to progress pt as able throughout stay.    Follow Up Recommendations  Follow surgeon's recommendation for DC plan and follow-up therapies;Outpatient PT     Equipment Recommendations  None recommended by PT    Recommendations for Other Services       Precautions / Restrictions Precautions Precautions: Knee;Fall Restrictions Weight Bearing Restrictions: No Other Position/Activity Restrictions: WBAT    Mobility  Bed Mobility               General bed mobility comments: pt OOB in recliner    Transfers Overall transfer level: Needs assistance Equipment used: Rolling walker (2 wheeled) Transfers: Sit to/from Stand Sit to Stand: Min guard;From elevated surface         General transfer comment: pt with use of bil UE's for power up, no assist needed to rise from recliner. guarding for safety. cues to extend Lt knee when sitting back to chair or EOB.  Ambulation/Gait Ambulation/Gait assistance: Min guard;Supervision Gait Distance (Feet): 50 Feet Assistive device: Rolling walker (2 wheeled) Gait Pattern/deviations: Step-to pattern;Decreased stride length;Decreased  weight shift to left Gait velocity: decr   General Gait Details: pt with improved recall for safe step pattern with RW. No overt LOB noted. pt's daughter present and providing safe guarding for gait with supervision and cues from PT.   Stairs Stairs: Yes Stairs assistance: Min guard;Min assist Stair Management: No rails;Step to pattern;Backwards;With walker Number of Stairs: 4 General stair comments: cues for step sequencing and walker position "up with good down with bad". pt's daughter provided assist for walker stability with cues/supervision from therapist. pt complete 2x2 steps.   Wheelchair Mobility    Modified Rankin (Stroke Patients Only)       Balance Overall balance assessment: Needs assistance Sitting-balance support: Feet supported Sitting balance-Leahy Scale: Good     Standing balance support: During functional activity;Bilateral upper extremity supported Standing balance-Leahy Scale: Fair                              Cognition Arousal/Alertness: Awake/alert Behavior During Therapy: WFL for tasks assessed/performed Overall Cognitive Status: Within Functional Limits for tasks assessed                                        Exercises Total Joint Exercises Ankle Circles/Pumps: AROM;Both;20 reps;Seated Quad Sets: AROM;Seated;Left;Other reps (comment) Short Arc Quad: AROM;Seated;Left;Other reps (comment) Heel Slides: AROM;Seated;Left;Other reps (comment) Hip ABduction/ADduction: AROM;Seated;Left;Other reps (comment)    General Comments        Pertinent Vitals/Pain Pain Assessment: 0-10 Pain Score: 2  Pain Location: Lt knee Pain Descriptors / Indicators: Aching;Discomfort;Sore Pain Intervention(s): Limited activity within patient's tolerance;Monitored during session;Repositioned;Ice applied    Home Living                      Prior Function            PT Goals (current goals can now be found in the care plan  section) Acute Rehab PT Goals Patient Stated Goal: get independent again and go bowling PT Goal Formulation: With patient Time For Goal Achievement: 02/05/21 Potential to Achieve Goals: Good Progress towards PT goals: Progressing toward goals    Frequency    7X/week      PT Plan Current plan remains appropriate    Co-evaluation              AM-PAC PT "6 Clicks" Mobility   Outcome Measure  Help needed turning from your back to your side while in a flat bed without using bedrails?: A Little Help needed moving from lying on your back to sitting on the side of a flat bed without using bedrails?: A Little Help needed moving to and from a bed to a chair (including a wheelchair)?: A Little Help needed standing up from a chair using your arms (e.g., wheelchair or bedside chair)?: A Little Help needed to walk in hospital room?: A Little Help needed climbing 3-5 steps with a railing? : A Lot 6 Click Score: 17    End of Session Equipment Utilized During Treatment: Gait belt Activity Tolerance: Patient tolerated treatment well Patient left: in chair;with call bell/phone within reach;with chair alarm set Nurse Communication: Mobility status PT Visit Diagnosis: Muscle weakness (generalized) (M62.81);Difficulty in walking, not elsewhere classified (R26.2)     Time: 3507-5732 PT Time Calculation (min) (ACUTE ONLY): 23 min  Charges:  $Gait Training: 8-22 mins $Therapeutic Exercise: 8-22 mins                     Wynn Maudlin, DPT Acute Rehabilitation Services Office [phone] Pager [phone]    Anitra Lauth 01/30/2021, 7:06 PM

## 2021-01-30 NOTE — TOC Transition Note (Signed)
Transition of Care [facility]) - CM/SW Discharge Note  Patient Details  Name: Kevin Patel MRN: [id number] Date of Birth: 1946/02/26  Transition of Care [facility]) CM/SW Contact:  Sherie Don, LCSW Phone Number: 01/30/2021, 9:35 AM  Clinical Narrative: Patient is expected to discharge after working with PT. CSW met with patient to confirm discharge plan. Patient will discharge home with OPPT through [facility]. Patient has a rolling walker, 3N1, and high toilets at home, so there are no DME needs at this time. TOC signing off.  Final next level of care: OP Rehab Barriers to Discharge: No Barriers Identified  Patient Goals and CMS Choice Patient states their goals for this hospitalization and ongoing recovery are:: Discharge home with [place] CMS Medicare.gov Compare Post Acute Care list provided to:: Patient Choice offered to / list presented to : NA  Discharge Plan and Services        DME Arranged: N/A DME Agency: NA  Readmission Risk Interventions No flowsheet data found.

## 2021-01-31 ENCOUNTER — Ambulatory Visit (INDEPENDENT_AMBULATORY_CARE_PROVIDER_SITE_OTHER): Payer: Medicare Other

## 2021-01-31 DIAGNOSIS — I5022 Chronic systolic (congestive) heart failure: Secondary | ICD-10-CM | POA: Diagnosis present

## 2021-01-31 DIAGNOSIS — Z8616 Personal history of COVID-19: Secondary | ICD-10-CM | POA: Diagnosis not present

## 2021-01-31 DIAGNOSIS — E785 Hyperlipidemia, unspecified: Secondary | ICD-10-CM | POA: Diagnosis present

## 2021-01-31 DIAGNOSIS — I2510 Atherosclerotic heart disease of native coronary artery without angina pectoris: Secondary | ICD-10-CM | POA: Diagnosis present

## 2021-01-31 DIAGNOSIS — Z87442 Personal history of urinary calculi: Secondary | ICD-10-CM | POA: Diagnosis not present

## 2021-01-31 DIAGNOSIS — I255 Ischemic cardiomyopathy: Secondary | ICD-10-CM | POA: Diagnosis present

## 2021-01-31 DIAGNOSIS — E039 Hypothyroidism, unspecified: Secondary | ICD-10-CM | POA: Diagnosis present

## 2021-01-31 DIAGNOSIS — Z95810 Presence of automatic (implantable) cardiac defibrillator: Secondary | ICD-10-CM | POA: Diagnosis not present

## 2021-01-31 DIAGNOSIS — M25762 Osteophyte, left knee: Secondary | ICD-10-CM | POA: Diagnosis present

## 2021-01-31 DIAGNOSIS — M1710 Unilateral primary osteoarthritis, unspecified knee: Secondary | ICD-10-CM | POA: Diagnosis present

## 2021-01-31 DIAGNOSIS — M1712 Unilateral primary osteoarthritis, left knee: Secondary | ICD-10-CM | POA: Diagnosis present

## 2021-01-31 DIAGNOSIS — Z955 Presence of coronary angioplasty implant and graft: Secondary | ICD-10-CM | POA: Diagnosis not present

## 2021-01-31 DIAGNOSIS — I130 Hypertensive heart and chronic kidney disease with heart failure and stage 1 through stage 4 chronic kidney disease, or unspecified chronic kidney disease: Secondary | ICD-10-CM | POA: Diagnosis present

## 2021-01-31 DIAGNOSIS — I252 Old myocardial infarction: Secondary | ICD-10-CM | POA: Diagnosis not present

## 2021-01-31 DIAGNOSIS — N183 Chronic kidney disease, stage 3 unspecified: Secondary | ICD-10-CM | POA: Diagnosis present

## 2021-01-31 DIAGNOSIS — Z79899 Other long term (current) drug therapy: Secondary | ICD-10-CM | POA: Diagnosis not present

## 2021-01-31 DIAGNOSIS — E1142 Type 2 diabetes mellitus with diabetic polyneuropathy: Secondary | ICD-10-CM | POA: Diagnosis present

## 2021-01-31 DIAGNOSIS — M179 Osteoarthritis of knee, unspecified: Secondary | ICD-10-CM | POA: Diagnosis present

## 2021-01-31 DIAGNOSIS — E1122 Type 2 diabetes mellitus with diabetic chronic kidney disease: Secondary | ICD-10-CM | POA: Diagnosis present

## 2021-01-31 DIAGNOSIS — Z87440 Personal history of urinary (tract) infections: Secondary | ICD-10-CM | POA: Diagnosis not present

## 2021-01-31 DIAGNOSIS — Z87891 Personal history of nicotine dependence: Secondary | ICD-10-CM | POA: Diagnosis not present

## 2021-01-31 DIAGNOSIS — D631 Anemia in chronic kidney disease: Secondary | ICD-10-CM | POA: Diagnosis present

## 2021-01-31 DIAGNOSIS — Z7982 Long term (current) use of aspirin: Secondary | ICD-10-CM | POA: Diagnosis not present

## 2021-01-31 LAB — BASIC METABOLIC PANEL
Anion gap: 8 (ref 5–15)
BUN: 30 mg/dL — ABNORMAL HIGH (ref 8–23)
CO2: 25 mmol/L (ref 22–32)
Calcium: 8.7 mg/dL — ABNORMAL LOW (ref 8.9–10.3)
Chloride: 104 mmol/L (ref 98–111)
Creatinine, Ser: 1.22 mg/dL (ref 0.61–1.24)
GFR, Estimated: >60 mL/min (ref >60)
Glucose, Bld: 176 mg/dL — ABNORMAL HIGH (ref 70–99)
Potassium: 4.4 mmol/L (ref 3.5–5.1)
Sodium: 137 mmol/L (ref 135–145)

## 2021-01-31 LAB — CBC
HCT: 29.6 % — ABNORMAL LOW (ref 39.0–52.0)
Hemoglobin: 10 g/dL — ABNORMAL LOW (ref 13.0–17.0)
MCH: 34.1 pg — ABNORMAL HIGH (ref 26.0–34.0)
MCHC: 33.8 g/dL (ref 30.0–36.0)
MCV: 101 fL — ABNORMAL HIGH (ref 80.0–100.0)
Platelets: 139 10*3/uL — ABNORMAL LOW (ref 150–400)
RBC: 2.93 MIL/uL — ABNORMAL LOW (ref 4.22–5.81)
RDW: 12.8 % (ref 11.5–15.5)
WBC: 16.2 10*3/uL — ABNORMAL HIGH (ref 4.0–10.5)
nRBC: 0 % (ref 0.0–0.2)

## 2021-01-31 NOTE — Progress Notes (Signed)
Subjective: 2 Days Post-Op Procedure(s) (LRB): TOTAL KNEE ARTHROPLASTY (Left) Patient reports pain as mild.   Patient seen in rounds for Dr. Lequita Halt. Patient is well, and has had no acute complaints or problems, other than issues with incisional drainage. Patient was able to ambulate 50 feet with PT yesterday. Denies SOB, chest pain, or calf pain. No acute overnight events.   We will continue therapy today.   Objective: Vital signs in last 24 hours: Temp:  [97.6 F (36.4 C)-97.7 F (36.5 C)] 97.7 F (36.5 C) (08/24 0457) Pulse Rate:  [59-61] 59 (08/24 0457) Resp:  [17-18] 17 (08/24 0457) BP: (99-109)/(47-58) 107/54 (08/24 0457) SpO2:  [98 %-99 %] 98 % (08/24 0457)  Intake/Output from previous day:  Intake/Output Summary (Last 24 hours) at 01/31/2021 0834 Last data filed at 01/31/2021 0600 Gross per 24 hour  Intake 1875.99 ml  Output 200 ml  Net 1675.99 ml     Intake/Output this shift: No intake/output data recorded.  Labs: Recent Labs    01/30/21 0318 01/31/21 0345  HGB 12.2* 10.0*   Recent Labs    01/30/21 0318 01/31/21 0345  WBC 16.6* 16.2*  RBC 3.56* 2.93*  HCT 35.7* 29.6*  PLT 142* 139*   Recent Labs    01/30/21 0318 01/31/21 0345  NA 137 137  K 4.3 4.4  CL 105 104  CO2 25 25  BUN 21 30*  CREATININE 1.14 1.22  GLUCOSE 208* 176*  CALCIUM 8.8* 8.7*   No results for input(s): LABPT, INR in the last 72 hours.  Exam: General - Patient is Alert and Oriented Extremity - Neurologically intact Neurovascular intact Intact pulses distally Dorsiflexion/Plantar flexion intact Dressing - moderate drainage Motor Function - intact, moving foot and toes well on exam.   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   Arthritis    knees   Chronic systolic HF (heart failure) (HCC)    Coronary artery disease    Diabetes mellitus with coincident hypertension (HCC) 09/09/2016   History of kidney  stones    HTN (hypertension)    Hyperlipidemia    Ischemic cardiomyopathy    MI (myocardial infarction) (HCC) 1998   2018,   Mural thrombus of heart    on coumadin until follow up echo with contrast in Sept 13, 2012 which showed no thrombus and coumadin was d/c   Pre-diabetes     Assessment/Plan: 2 Days Post-Op Procedure(s) (LRB): TOTAL KNEE ARTHROPLASTY (Left) Principal Problem:   OA (osteoarthritis) of knee Active Problems:   Primary osteoarthritis of left knee  Estimated body mass index is 32.34 kg/m as calculated from the following:   Height as of this encounter: 6' 3.5" (1.918 m).   Weight as of this encounter: 118.9 kg. Up with therapy   Patient's anticipated LOS is less than 2 midnights, meeting these requirements: - Lives within 1 hour of care - Has a competent adult at home to recover with post-op - NO history of  - Chronic pain requiring opioids  - Diabetes  - Coronary Artery Disease  - Heart failure  - Heart attack  - Stroke  - DVT/VTE  - Cardiac arrhythmia  - Respiratory Failure/COPD  - Renal failure  - Anemia  - Advanced Liver disease     DVT Prophylaxis - Xarelto Weight bearing as tolerated. Continue therapy.  Plan is to go Home after hospital stay.  Inspected dressing this morning - moderate drainage pooling beneath aquacel dressing. Removed aquacel dressing  and expressed small amount of blood from the distal incision. Replaced aquacel dressing, reapplied ABD dressings and compression wrap around the incision as well.   Plan for two sessions with PT today, and if meeting goals, will plan for discharge this afternoon.   Patient to follow up in two weeks with Dr. Lequita Halt in clinic.   The PDMP database was reviewed today prior to any opioid medications being prescribed to this patient.   Nelia Shi, MBA, PA-C Orthopedic Surgery 01/31/2021, 8:34 AM

## 2021-01-31 NOTE — Progress Notes (Signed)
Physical Therapy Treatment Patient Details Name: Kevin Patel MRN: [id number] DOB: 1945/12/19 Today's Date: 01/31/2021    History of Present Illness Patient is 75 y.o. male s/p Lt TKA on 01/29/21 with PMH significant for HTN, CAD, DM, HLD, MI in 1998, ICD placement, OA.    PT Comments    Patient making good progress with acute PT and ambulated ~180' with RW and good recall for safe positioning and step pattern. Pt required min cues for Lt knee position with sit<>stand to prevent excessive flexion (pt in immobilizer this session). Reviewed exercises for circulation/edema management. Acute PT will continue to progress pt as able throughout stay. Pt has had bleeding from incision and RN/MD aware.     Follow Up Recommendations  Follow surgeon's recommendation for DC plan and follow-up therapies;Outpatient PT     Equipment Recommendations  None recommended by PT    Recommendations for Other Services       Precautions / Restrictions Precautions Precautions: Knee;Fall Precaution Booklet Issued: No Restrictions Weight Bearing Restrictions: No LLE Weight Bearing: Weight bearing as tolerated Other Position/Activity Restrictions: WBAT    Mobility  Bed Mobility Overal bed mobility: Needs Assistance Bed Mobility: Supine to Sit     Supine to sit: Supervision     General bed mobility comments: supervision from flat bed.    Transfers Overall transfer level: Needs assistance Equipment used: Rolling walker (2 wheeled) Transfers: Sit to/from Stand Sit to Stand: Min guard;From elevated surface         General transfer comment: pt with use of bil UE's for power up, no assist needed to rise from recliner. guarding for safety. cues to extend Lt knee when sitting back to chair or EOB.  Ambulation/Gait Ambulation/Gait assistance: Min guard;Supervision Gait Distance (Feet): 180 Feet Assistive device: Rolling walker (2 wheeled) Gait Pattern/deviations: Step-to pattern;Decreased  stride length;Decreased weight shift to left Gait velocity: decr   General Gait Details: pt with improved recall for safe step pattern with RW. No overt LOB noted. pt's daughter present and providing safe guarding for gait with supervision and cues from PT.   Stairs             Wheelchair Mobility    Modified Rankin (Stroke Patients Only)       Balance Overall balance assessment: Needs assistance Sitting-balance support: Feet supported Sitting balance-Leahy Scale: Good     Standing balance support: During functional activity;Bilateral upper extremity supported Standing balance-Leahy Scale: Fair                              Cognition Arousal/Alertness: Awake/alert Behavior During Therapy: WFL for tasks assessed/performed Overall Cognitive Status: Within Functional Limits for tasks assessed                                        Exercises Total Joint Exercises Ankle Circles/Pumps: AROM;Both;20 reps;Seated    General Comments        Pertinent Vitals/Pain Pain Assessment: 0-10 Pain Score: 4  Pain Location: Lt knee Pain Descriptors / Indicators: Aching;Discomfort;Sore Pain Intervention(s): Limited activity within patient's tolerance;Monitored during session;Repositioned;Premedicated before session    Home Living                      Prior Function            PT Goals (current goals can  now be found in the care plan section) Acute Rehab PT Goals Patient Stated Goal: get independent again and go bowling PT Goal Formulation: With patient Time For Goal Achievement: 02/05/21 Potential to Achieve Goals: Good Progress towards PT goals: Progressing toward goals    Frequency    7X/week      PT Plan Current plan remains appropriate    Co-evaluation              AM-PAC PT "6 Clicks" Mobility   Outcome Measure  Help needed turning from your back to your side while in a flat bed without using bedrails?: A  Little Help needed moving from lying on your back to sitting on the side of a flat bed without using bedrails?: A Little Help needed moving to and from a bed to a chair (including a wheelchair)?: A Little Help needed standing up from a chair using your arms (e.g., wheelchair or bedside chair)?: A Little Help needed to walk in hospital room?: A Little Help needed climbing 3-5 steps with a railing? : A Little 6 Click Score: 18    End of Session Equipment Utilized During Treatment: Gait belt Activity Tolerance: Patient tolerated treatment well Patient left: in chair;with call bell/phone within reach;with chair alarm set Nurse Communication: Mobility status PT Visit Diagnosis: Muscle weakness (generalized) (M62.81);Difficulty in walking, not elsewhere classified (R26.2)     Time: 3086-5784 PT Time Calculation (min) (ACUTE ONLY): 23 min  Charges:  $Gait Training: 23-37 mins                     Wynn Maudlin, DPT Acute Rehabilitation Services Office [phone] Pager [phone]    Anitra Lauth 01/31/2021, 3:27 PM

## 2021-02-01 LAB — CUP PACEART REMOTE DEVICE CHECK
Battery Remaining Longevity: 96 mo
Battery Voltage: 2.99 V
Brady Statistic RV Percent Paced: 0.01 %
Date Time Interrogation Session: 20220824232703
HighPow Impedance: 65 Ohm
Implantable Lead Implant Date: 20180905
Implantable Lead Location: 753860
Implantable Pulse Generator Implant Date: 20180905
Lead Channel Impedance Value: 361 Ohm
Lead Channel Impedance Value: 399 Ohm
Lead Channel Pacing Threshold Amplitude: 0.5 V
Lead Channel Pacing Threshold Pulse Width: 0.4 ms
Lead Channel Sensing Intrinsic Amplitude: 11.125 mV
Lead Channel Sensing Intrinsic Amplitude: 11.125 mV
Lead Channel Setting Pacing Amplitude: 2.5 V
Lead Channel Setting Pacing Pulse Width: 0.4 ms
Lead Channel Setting Sensing Sensitivity: 0.3 mV

## 2021-02-02 NOTE — Telephone Encounter (Signed)
Patient and patients daughter walked into office to inquire about patients Entresto patient assistance. Per patients daughter patient will be out of medication tomorrow. Advised that Dr. Renaye Rakers nurse is out office but I will forward message to her to inquire about status of patients assistance. Provided patient with 1 bottle of Entresto Dosage 49/51 which is enough for 2 weeks. Advised I would forward message to Misty Stanley, RN to make her aware. Patients daughter verbalized understanding.

## 2021-02-05 ENCOUNTER — Ambulatory Visit (INDEPENDENT_AMBULATORY_CARE_PROVIDER_SITE_OTHER): Payer: Medicare Other

## 2021-02-05 DIAGNOSIS — Z95810 Presence of automatic (implantable) cardiac defibrillator: Secondary | ICD-10-CM

## 2021-02-05 DIAGNOSIS — I5022 Chronic systolic (congestive) heart failure: Secondary | ICD-10-CM

## 2021-02-05 NOTE — Progress Notes (Signed)
EPIC Encounter for ICM Monitoring  Patient Name: Kevin Patel is a 75 y.o. male Date: 02/05/2021 Primary Care Physican: [name]., MD Primary Cardiologist: Croitoru Electrophysiologist: Croitoru 01/29/2021 Office Weight: 260 lbs                                                            Spoke with patient.  Pt had Left TKR surgery on 8/22 and reports swelling of left knee and foot.      Optivol thoracic impedance suggesting possible fluid accumulation starting 8/22 which correlates with Total knee Surgery.    Prescribed: No diuretic   Labs: 12/06/2020 Creatinine 1.63, BUN 25, Potassium 3.9, Sodium 142, GFR 44 A complete set of results can be found in Results Review.   Recommendations:  Copy sent to Dr Royann Shivers for review.  Advised patient if he develops any fluid symptoms such as SOB to call.    Follow-up plan:  ICM clinic phone appointment 02/13/2021 to recheck fluid levels.   91 day device clinic remote transmission 05/02/2021.     EP/Cardiology Office Visits:  Recall 06/21/2021 with Dr. Royann Shivers.     Copy of ICM check sent to Dr. Royann Shivers.    3 month ICM trend: 02/05/2021.    1 Year ICM trend:       Karie Soda, RN 02/05/2021 2:52 PM

## 2021-02-05 NOTE — Discharge Summary (Signed)
Physician Discharge Summary   Patient ID: Kevin Patel MRN: [id number] DOB/AGE: [birth year] 75 y.o.  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   Arthritis    knees   Chronic systolic HF (heart failure) (HCC)    Coronary artery disease    Diabetes mellitus with coincident hypertension (HCC) 09/09/2016   History of kidney stones    HTN (hypertension)    Hyperlipidemia    Ischemic cardiomyopathy    MI (myocardial infarction) (HCC) 1998   2018,   Mural thrombus of heart    on coumadin until follow up echo with contrast in Sept 13, 2012 which showed no thrombus and coumadin was d/c   Pre-diabetes    Discharge Diagnoses:   Principal Problem:   OA (osteoarthritis) of knee Active Problems:   Primary osteoarthritis of left knee   Osteoarthritis of knee  Estimated body mass index is 32.34 kg/m as calculated from the following:   Height as of this encounter: 6' 3.5" (1.918 m).   Weight as of this encounter: 118.9 kg.  Procedure:  Procedure(s) (LRB): TOTAL KNEE ARTHROPLASTY (Left)   Consults: None  HPI: Kevin Patel is a 75 y.o. year old male with end stage OA of his left knee with progressively worsening pain and dysfunction. He has constant pain, with activity and at rest and significant functional deficits with difficulties even with ADLs. He has had extensive non-op management including analgesics, injections of cortisone and viscosupplements, and home exercise program, but remains in significant pain with significant dysfunction. Radiographs show bone on bone arthritis medial and patellofemoral. He presents now for left Total Knee Arthroplasty.     Laboratory Data: Admission on 01/29/2021, Discharged on 01/31/2021  Component Date Value Ref Range Status   Glucose-Capillary 01/29/2021 126 (A) 70  - 99 mg/dL Final   Glucose reference range applies only to samples taken after fasting for at least 8 hours.   WBC 01/30/2021 16.6 (A) 4.0 - 10.5 K/uL Final   RBC 01/30/2021 3.56 (A) 4.22 - 5.81 MIL/uL Final   Hemoglobin 01/30/2021 12.2 (A) 13.0 - 17.0 g/dL Final   HCT 96/78/9381 35.7 (A) 39.0 - 52.0 % Final   MCV 01/30/2021 100.3 (A) 80.0 - 100.0 fL Final   MCH 01/30/2021 34.3 (A) 26.0 - 34.0 pg Final   MCHC 01/30/2021 34.2  30.0 - 36.0 g/dL Final   RDW 01/75/1025 12.4  11.5 - 15.5 % Final   Platelets 01/30/2021 142 (A) 150 - 400 K/uL Final   nRBC 01/30/2021 0.0  0.0 - 0.2 % Final   Performed at [facility], [address] [place], [place] [address]   Sodium 01/30/2021 137  135 - 145 mmol/L Final   Potassium 01/30/2021 4.3  3.5 - 5.1 mmol/L Final   Chloride 01/30/2021 105  98 - 111 mmol/L Final   CO2 01/30/2021 25  22 - 32 mmol/L Final   Glucose, Bld 01/30/2021 208 (A) 70 - 99 mg/dL Final   Glucose reference range applies only to samples taken after fasting for at least 8 hours.   BUN 01/30/2021 21  8 - 23 mg/dL Final   Creatinine, Ser 01/30/2021 1.14  0.61 - 1.24 mg/dL Final   Calcium 82/42/3536 8.8 (A) 8.9 - 10.3 mg/dL Final   GFR, Estimated 01/30/2021 >60  >60 mL/min Final   Comment: (NOTE) Calculated using the CKD-EPI  Creatinine Equation (2021)    Anion gap 01/30/2021 7  5 - 15 Final   Performed at [facility], [address] [place], [place] [address]   WBC 01/31/2021 16.2 (A) 4.0 - 10.5 K/uL Final   RBC 01/31/2021 2.93 (A) 4.22 - 5.81 MIL/uL Final   Hemoglobin 01/31/2021 10.0 (A) 13.0 - 17.0 g/dL Final   HCT 60/45/4098 29.6 (A) 39.0 - 52.0 % Final   MCV 01/31/2021 101.0 (A) 80.0 - 100.0 fL Final   MCH 01/31/2021 34.1 (A) 26.0 - 34.0 pg Final   MCHC 01/31/2021 33.8  30.0 - 36.0 g/dL Final   RDW 11/91/4782 12.8  11.5 - 15.5 % Final   Platelets 01/31/2021 139 (A) 150 - 400 K/uL Final   nRBC 01/31/2021 0.0  0.0 - 0.2 % Final    Performed at [facility], [address], [place], [place] [address]   Sodium 01/31/2021 137  135 - 145 mmol/L Final   Potassium 01/31/2021 4.4  3.5 - 5.1 mmol/L Final   Chloride 01/31/2021 104  98 - 111 mmol/L Final   CO2 01/31/2021 25  22 - 32 mmol/L Final   Glucose, Bld 01/31/2021 176 (A) 70 - 99 mg/dL Final   Glucose reference range applies only to samples taken after fasting for at least 8 hours.   BUN 01/31/2021 30 (A) 8 - 23 mg/dL Final   Creatinine, Ser 01/31/2021 1.22  0.61 - 1.24 mg/dL Final   Calcium 30/86/5784 8.7 (A) 8.9 - 10.3 mg/dL Final   GFR, Estimated 01/31/2021 >60  >60 mL/min Final   Comment: (NOTE) Calculated using the CKD-EPI Creatinine Equation (2021)    Anion gap 01/31/2021 8  5 - 15 Final   Performed at [facility], [address], [place], [place] [address]  Clinical Support on 01/31/2021  Component Date Value Ref Range Status   Date Time Interrogation Session 01/31/2021 [id number]   Final   Pulse Generator Manufacturer 01/31/2021 MERM   Final   Pulse Gen Model 01/31/2021 [id number] Visia AF MRI VR   Final   Pulse Gen Serial Number 01/31/2021 [id number]   Final   Clinic Name 01/31/2021 [facility]   Final   Implantable Pulse Generator Type 01/31/2021 Implantable Cardiac Defibulator   Final   Implantable Pulse Generator Implan* 01/31/2021 [id number]   Final   Implantable Lead Manufacturer 01/31/2021 MERM   Final   Implantable Lead Model 01/31/2021 [id number] Sprint Quattro Secure S MRI SureScan   Final   Implantable Lead Serial Number 01/31/2021 [id number]   Final   Implantable Lead Implant Date 01/31/2021 [id number]   Final   Implantable Lead Location Detail 1 01/31/2021 APEX   Final   Implantable Lead Location 01/31/2021 [id number]   Final   Lead Channel Setting Sensing Sensi* 01/31/2021 0.3  mV Final   Lead Channel Setting Pacing Pulse * 01/31/2021 0.4  ms Final   Lead Channel Setting Pacing Amplit* 01/31/2021 2.5  V Final    Lead Channel Impedance Value 01/31/2021 399  ohm Final   Lead Channel Impedance Value 01/31/2021 361  ohm Final   Lead Channel Sensing Intrinsic Amp* 01/31/2021 11.125  mV Final   Lead Channel Sensing Intrinsic Amp* 01/31/2021 11.125  mV Final   Lead Channel Pacing Threshold Ampl* 01/31/2021 0.5  V Final   Lead Channel Pacing Threshold Puls* 01/31/2021 0.4  ms Final   HighPow Impedance 01/31/2021 65  ohm Final   Battery Status 01/31/2021 OK   Final  Battery Remaining Longevity 01/31/2021 96  mo Final   Battery Voltage 01/31/2021 2.99  V Final   Brady Statistic RV Percent Paced 01/31/2021 0.01  % Final   Eval Rhythm 01/31/2021 SR at 87 bpm   Final  Orders Only on 01/25/2021  Component Date Value Ref Range Status   SARS Coronavirus 2 01/25/2021 RESULT: NEGATIVE   Final   Comment: RESULT: NEGATIVESARS-CoV-2 INTERPRETATION:A NEGATIVE  test result means that SARS-CoV-2 RNA was not present in the specimen above the limit of detection of this test. This does not preclude a possible SARS-CoV-2 infection and should not be used as the  sole basis for patient management decisions. Negative results must be combined with clinical observations, patient history, and epidemiological information. Optimum specimen types and timing for peak viral levels during infections caused by SARS-CoV-2  have not been determined. Collection of multiple specimens or types of specimens may be necessary to detect virus. Improper specimen collection and handling, sequence variability under primers/probes, or organism present below the limit of detection may  lead to false negative results. Positive and negative predictive values of testing are highly dependent on prevalence. False negative test results are more likely when prevalence of disease is high.The expected result is NEGATIVE.Fact S                          heet for  Healthcare Providers: College[employer]gl Sheet for Patients:  https://poole-freeman.org/ Reference Range - Negative   Hospital Outpatient Visit on 01/18/2021  Component Date Value Ref Range Status   WBC 01/18/2021 7.3  4.0 - 10.5 K/uL Final   RBC 01/18/2021 4.16 (A) 4.22 - 5.81 MIL/uL Final   Hemoglobin 01/18/2021 13.9  13.0 - 17.0 g/dL Final   HCT 29/92/4268 41.7  39.0 - 52.0 % Final   MCV 01/18/2021 100.2 (A) 80.0 - 100.0 fL Final   MCH 01/18/2021 33.4  26.0 - 34.0 pg Final   MCHC 01/18/2021 33.3  30.0 - 36.0 g/dL Final   RDW 34/19/6222 12.7  11.5 - 15.5 % Final   Platelets 01/18/2021 175  150 - 400 K/uL Final   nRBC 01/18/2021 0.0  0.0 - 0.2 % Final   Performed at [facility], [address], [place], [place] [address]   Sodium 01/18/2021 140  135 - 145 mmol/L Final   Potassium 01/18/2021 4.4  3.5 - 5.1 mmol/L Final   Chloride 01/18/2021 105  98 - 111 mmol/L Final   CO2 01/18/2021 28  22 - 32 mmol/L Final   Glucose, Bld 01/18/2021 151 (A) 70 - 99 mg/dL Final   Glucose reference range applies only to samples taken after fasting for at least 8 hours.   BUN 01/18/2021 16  8 - 23 mg/dL Final   Creatinine, Ser 01/18/2021 1.09  0.61 - 1.24 mg/dL Final   Calcium 21/19/4174 9.6  8.9 - 10.3 mg/dL Final   Total Protein 01/21/4817 7.0  6.5 - 8.1 g/dL Final   Albumin 56/31/4970 4.3  3.5 - 5.0 g/dL Final   AST 26/37/8588 21  15 - 41 U/L Final   ALT 01/18/2021 19  0 - 44 U/L Final   Alkaline Phosphatase 01/18/2021 55  38 - 126 U/L Final   Total Bilirubin 01/18/2021 1.1  0.3 - 1.2 mg/dL Final   GFR, Estimated 01/18/2021 >60  >60 mL/min Final   Comment: (NOTE) Calculated using the CKD-EPI Creatinine Equation (2021)    Anion gap 01/18/2021 7  5 -  15 Final   Performed at [facility], [address], [place], [place] [address]   ABO/RH(D) 01/18/2021 A POS   Final   Antibody Screen 01/18/2021 NEG   Final   Sample Expiration 01/18/2021 02/01/2021,2359   Final   Extend sample reason 01/18/2021     Final                   Value:NO TRANSFUSIONS OR PREGNANCY IN THE PAST 3 MONTHS Performed at [facility], [address], [place], [place] [address]    Prothrombin Time 01/18/2021 14.2  11.4 - 15.2 seconds Final   INR 01/18/2021 1.1  0.8 - 1.2 Final   Comment: (NOTE) INR goal varies based on device and disease states. Performed at [facility], [address], [place], [place] [address]    Hgb A1c MFr Bld 01/18/2021 6.5 (A) 4.8 - 5.6 % Final   Comment: (NOTE) Pre diabetes:          5.7%-6.4%  Diabetes:              >6.4%  Glycemic control for   <7.0% adults with diabetes    Mean Plasma Glucose 01/18/2021 139.85  mg/dL Final   Performed at [facility], [address] [place], [place] [address]   MRSA, PCR 01/18/2021 NEGATIVE  NEGATIVE Final   Staphylococcus aureus 01/18/2021 NEGATIVE  NEGATIVE Final   Comment: (NOTE) The Xpert SA Assay (FDA approved for NASAL specimens in patients 51 years of age and older), is one component of a comprehensive surveillance program. It is not intended to diagnose infection nor to guide or monitor treatment. Performed at [facility], [address] [place], [place] [address]   Hospital Outpatient Visit on 01/03/2021  Component Date Value Ref Range Status   Rest HR 01/03/2021 52  bpm Final   Rest BP 01/03/2021 134/84  mmHg Final   Peak HR 01/03/2021 72  bpm Final   Peak BP 01/03/2021 137/76  mmHg Final   SSS 01/03/2021 23   Final   SRS 01/03/2021 23   Final   SDS 01/03/2021 0   Final   TID 01/03/2021 1.10   Final   LV sys vol 01/03/2021 256  mL Final   LV dias vol 01/03/2021 346  62 - 150 mL Final  Appointment on 12/29/2020  Component Date Value Ref Range Status   Area-P 1/2 12/29/2020 2.56  cm2 Final   S' Lateral 12/29/2020 5.40  cm Final     X-Rays:CUP PACEART REMOTE DEVICE CHECK  Result Date: 02/01/2021 Scheduled remote reviewed. Normal device function.  Next remote  91 days. Hassell Halim, RN, CCDS, [employer]   EKG: Orders placed or performed during the hospital encounter of 01/29/21   EKG     Hospital Course: Kevin Patel is a 75 y.o. who was admitted to [facility]. They were brought to the operating room on 01/29/2021 and underwent Procedure(s): TOTAL KNEE ARTHROPLASTY.  Patient tolerated the procedure well and was later transferred to the recovery room and then to the orthopaedic floor for postoperative care. They were given PO and IV analgesics for pain control following their surgery. They were given 24 hours of postoperative antibiotics of  Anti-infectives (From admission, onward)    Start     Dose/Rate Route Frequency Ordered Stop   01/29/21 1200  ceFAZolin (ANCEF) IVPB 2g/100 mL premix        2  g 200 mL/hr over 30 Minutes Intravenous Every 6 hours 01/29/21 1001 01/30/21 0733   01/29/21 0600  ceFAZolin (ANCEF) IVPB 2g/100 mL premix        2 g 200 mL/hr over 30 Minutes Intravenous On call to O.R. 01/29/21 [id number] 01/29/21 0714      and started on DVT prophylaxis in the form of Xarelto.   PT and OT were ordered for total joint protocol. Discharge planning consulted to help with postop disposition and equipment needs. Patient had a decent night on the evening of surgery. They started to get up OOB with therapy on POD #0. Stayed an additional night due to concerns about bleeding from the incision. Dressing changes with compression were performed with improvement. Continued to work with therapy into POD #2. Pt was seen during rounds on day two and was ready to go home pending progress with therapy. Pt worked with therapy for two additional sessions and was meeting their goals. He was discharged to home later that day in stable condition.  Diet: Cardiac diet Activity: WBAT Follow-up: in 2 weeks Disposition: Home with OPPT Discharged Condition: stable   Discharge Instructions     Call MD / Call 911   Complete by: As directed     If you experience chest pain or shortness of breath, CALL 911 and be transported to the hospital emergency room.  If you develope a fever above 101 F, pus (white drainage) or increased drainage or redness at the wound, or calf pain, call your surgeon's office.   Change dressing   Complete by: As directed    You may remove the bulky bandage (ACE wrap and gauze) two days after surgery. You will have an adhesive waterproof bandage underneath. Leave this in place until your first follow-up appointment.   Constipation Prevention   Complete by: As directed    Drink plenty of fluids.  Prune juice may be helpful.  You may use a stool softener, such as Colace (over the counter) 100 mg twice a day.  Use MiraLax (over the counter) for constipation as needed.   Diet - low sodium heart healthy   Complete by: As directed    Do not put a pillow under the knee. Place it under the heel.   Complete by: As directed    Driving restrictions   Complete by: As directed    No driving for two weeks   Post-operative opioid taper instructions:   Complete by: As directed    POST-OPERATIVE OPIOID TAPER INSTRUCTIONS: It is important to wean off of your opioid medication as soon as possible. If you do not need pain medication after your surgery it is ok to stop day one. Opioids include: Codeine, Hydrocodone(Norco, Vicodin), Oxycodone(Percocet, oxycontin) and hydromorphone amongst others.  Long term and even short term use of opiods can cause: Increased pain response Dependence Constipation Depression Respiratory depression And more.  Withdrawal symptoms can include Flu like symptoms Nausea, vomiting And more Techniques to manage these symptoms Hydrate well Eat regular healthy meals Stay active Use relaxation techniques(deep breathing, meditating, yoga) Do Not substitute Alcohol to help with tapering If you have been on opioids for less than two weeks and do not have pain than it is ok to stop all together.   Plan to wean off of opioids This plan should start within one week post op of your joint replacement. Maintain the same interval or time between taking each dose and first decrease the dose.  Cut the total daily intake  of opioids by one tablet each day Next start to increase the time between doses. The last dose that should be eliminated is the evening dose.      TED hose   Complete by: As directed    Use stockings (TED hose) for three weeks on both leg(s).  You may remove them at night for sleeping.   Weight bearing as tolerated   Complete by: As directed       Allergies as of 01/31/2021   No Known Allergies      Medication List     STOP taking these medications    albuterol 108 (90 Base) MCG/ACT inhaler Commonly known as: VENTOLIN HFA   aspirin 81 MG tablet   cholecalciferol 25 MCG (1000 UNIT) tablet Commonly known as: VITAMIN D   co-enzyme Q-10 30 MG capsule   GLUCOSAMINE CHONDROITIN JOINT PO   multivitamin capsule   vitamin C 1000 MG tablet   vitamin E 1000 UNIT capsule       TAKE these medications    acetaminophen 325 MG tablet Commonly known as: TYLENOL Take 2 tablets (650 mg total) by mouth every 4 (four) hours as needed for headache or mild pain.   amLODipine 5 MG tablet Commonly known as: NORVASC Take 1 tablet (5 mg total) by mouth daily.   atorvastatin 80 MG tablet Commonly known as: LIPITOR Take 1 tablet by mouth once daily   carvedilol 25 MG tablet Commonly known as: COREG Take 1 tablet (25 mg total) by mouth 2 (two) times daily with a meal.   gabapentin 300 MG capsule Commonly known as: NEURONTIN Take a 300 mg capsule three times a day for two weeks following surgery.Then take a 300 mg capsule two times a day for two weeks. Then take a 300 mg capsule once a day for two weeks. Then discontinue.   methocarbamol 500 MG tablet Commonly known as: ROBAXIN Take 1 tablet (500 mg total) by mouth every 6 (six) hours as needed for muscle  spasms.   nitroGLYCERIN 0.4 MG SL tablet Commonly known as: NITROSTAT Place 0.4 mg under the tongue every 5 (five) minutes as needed for chest pain.   oxyCODONE 5 MG immediate release tablet Commonly known as: Oxy IR/ROXICODONE Take 1-2 tablets (5-10 mg total) by mouth every 6 (six) hours as needed for severe pain. Not to exceed 6 tablets a day.   rivaroxaban 10 MG Tabs tablet Commonly known as: XARELTO Take 1 tablet (10 mg total) by mouth daily with breakfast for 20 days. Then resume one 81 mg aspirin once a day.   sacubitril-valsartan 49-51 MG Commonly known as: ENTRESTO Take 1 tablet by mouth 2 (two) times daily.   traMADol 50 MG tablet Commonly known as: ULTRAM Take 1-2 tablets (50-100 mg total) by mouth every 6 (six) hours as needed for moderate pain.               Discharge Care Instructions  (From admission, onward)           Start     Ordered   01/30/21 0000  Weight bearing as tolerated        01/30/21 0743   01/30/21 0000  Change dressing       Comments: You may remove the bulky bandage (ACE wrap and gauze) two days after surgery. You will have an adhesive waterproof bandage underneath. Leave this in place until your first follow-up appointment.   01/30/21 3154  Follow-up Information     Ollen Gross, MD. Schedule an appointment as soon as possible for a visit on 02/13/2021.   Specialty: Orthopedic Surgery Contact information: [address] [place] [address] [place] [address] [phone]                 Signed: Arther Abbott, PA-C Orthopedic Surgery 02/05/2021, 10:30 AM

## 2021-02-07 NOTE — Progress Notes (Signed)
Croitoru, Rachelle Hora, MD  Jaelene Garciagarcia, Josephine Igo, RN I suspect he got more IV fluids than we would have liked during the hospital stay. Since he is asymptomatic, and usually does not require diuretics, I would give hima another 1-2 weeks to see if he re-balances by himself.  Recheck in 2 weeks, call us sooner for dyspnea or increasing weight/edema.  Thanks!

## 2021-02-07 NOTE — Progress Notes (Signed)
Spoke with patient and advised of Dr Croitoru's recommendations to monitor for fluid symptoms which were reviewed with patient.  Explained the report suggesting fluid accumulation was more than likely from receiving IV fluids during recent hospitalization and hopefully will balance out over the next 2 weeks. Advised to call if he experiences any fluid symptoms.  Will recheck fluid levels in two weeks 02/19/2021 as suggested by Dr Royann Shivers.

## 2021-02-09 NOTE — Telephone Encounter (Signed)
Attempted to reach the patient to notify him that assistance has been approved. Notice has been sent to the patient as well.

## 2021-02-14 ENCOUNTER — Encounter: Payer: Self-pay | Admitting: Cardiovascular Disease

## 2021-02-14 NOTE — Telephone Encounter (Signed)
Error. No encounter needed  °

## 2021-02-14 NOTE — Telephone Encounter (Signed)
Patient was following up on his Patient Assistance for the [facility]

## 2021-02-14 NOTE — Telephone Encounter (Signed)
Returned the call to the patient. He stated that he got the letter that assistance has been approved. He will be out of the medication this weekend. He has been advised to call [employer] with the number provided to see when he will get his first shipment.

## 2021-02-15 NOTE — Progress Notes (Signed)
Remote ICD transmission.   

## 2021-02-19 ENCOUNTER — Ambulatory Visit (INDEPENDENT_AMBULATORY_CARE_PROVIDER_SITE_OTHER): Payer: Medicare Other

## 2021-02-19 DIAGNOSIS — I5022 Chronic systolic (congestive) heart failure: Secondary | ICD-10-CM

## 2021-02-19 DIAGNOSIS — Z95810 Presence of automatic (implantable) cardiac defibrillator: Secondary | ICD-10-CM

## 2021-02-21 ENCOUNTER — Telehealth: Payer: Self-pay

## 2021-02-21 NOTE — Telephone Encounter (Signed)
I spoke with the patient and he agreed to send missed ICM transmission. 

## 2021-02-23 NOTE — Progress Notes (Signed)
EPIC Encounter for ICM Monitoring  Patient Name: Kevin Patel is a 75 y.o. male Date: 02/23/2021 Primary Care Physican: [name]., MD Primary Cardiologist: Croitoru Electrophysiologist: Croitoru 01/29/2021 Office Weight: 260 lbs                                                            Spoke with patient and heart failure questions reviewed.  Pt asymptomatic for fluid accumulation and feeling well.   Optivol thoracic impedance suggesting fluid levels returned to normal after Total knee Surgery.    Prescribed: No diuretic   Labs: 01/31/2021 Creatinine 1.22, BUN 30, Potassium 4.4, Sodium 137, GFR >60 01/30/2021 Creatinine 1.14, BUN 21, Potassium 4.3, Sodium 137, GFR >60  01/18/2021 Creatinine 1.09, BUN 16, Potassium 4.4, Sodium 140, GFR >60  12/06/2020 Creatinine 1.63, BUN 25, Potassium 3.9, Sodium 142, GFR 44 A complete set of results can be found in Results Review.   Recommendations: No changes and encouraged to call if experiencing any fluid symptoms.   Follow-up plan:  ICM clinic phone appointment 03/26/2021.   91 day device clinic remote transmission 05/02/2021.     EP/Cardiology Office Visits:  Recall 06/21/2021 with Dr. Royann Shivers.     Copy of ICM check sent to Dr. Royann Shivers.     3 month ICM trend: 02/23/2021.    1 Year ICM trend:       Karie Soda, RN 02/23/2021 8:54 AM

## 2021-03-26 ENCOUNTER — Ambulatory Visit (INDEPENDENT_AMBULATORY_CARE_PROVIDER_SITE_OTHER): Payer: Medicare Other

## 2021-03-26 DIAGNOSIS — I5022 Chronic systolic (congestive) heart failure: Secondary | ICD-10-CM | POA: Diagnosis not present

## 2021-03-26 DIAGNOSIS — Z95810 Presence of automatic (implantable) cardiac defibrillator: Secondary | ICD-10-CM

## 2021-03-28 NOTE — Progress Notes (Signed)
EPIC Encounter for ICM Monitoring  Patient Name: Kevin Patel is a 75 y.o. male Date: 03/28/2021 Primary Care Physican: [name]., MD Primary Cardiologist: Croitoru Electrophysiologist: Croitoru 01/29/2021 Office Weight: 260 lbs                                                            Spoke with patient and heart failure questions reviewed.  Pt asymptomatic for fluid accumulation and feeling well.  He is recovering very well from total knee and walking without cane.    Optivol thoracic impedance suggesting normal fluid levels with some days of minimal fluctuation suggesting possible fluid accumulation.    Prescribed: No diuretic   Labs: 01/31/2021 Creatinine 1.22, BUN 30, Potassium 4.4, Sodium 137, GFR >60 01/30/2021 Creatinine 1.14, BUN 21, Potassium 4.3, Sodium 137, GFR >60  01/18/2021 Creatinine 1.09, BUN 16, Potassium 4.4, Sodium 140, GFR >60  12/06/2020 Creatinine 1.63, BUN 25, Potassium 3.9, Sodium 142, GFR 44 A complete set of results can be found in Results Review.   Recommendations: No changes and encouraged to call if experiencing any fluid symptoms.   Follow-up plan:  ICM clinic phone appointment 04/30/2021.   91 day device clinic remote transmission 05/02/2021.     EP/Cardiology Office Visits:  Recall 06/21/2021 with Dr. Royann Shivers.     Copy of ICM check sent to Dr. Royann Shivers.      3 month ICM trend: 03/26/2021.    1 Year ICM trend:       Karie Soda, RN 03/28/2021 4:40 PM

## 2021-04-30 ENCOUNTER — Telehealth: Payer: Self-pay | Admitting: Cardiovascular Disease

## 2021-04-30 ENCOUNTER — Ambulatory Visit (INDEPENDENT_AMBULATORY_CARE_PROVIDER_SITE_OTHER): Payer: Medicare Other

## 2021-04-30 DIAGNOSIS — Z95810 Presence of automatic (implantable) cardiac defibrillator: Secondary | ICD-10-CM

## 2021-04-30 DIAGNOSIS — I5022 Chronic systolic (congestive) heart failure: Secondary | ICD-10-CM

## 2021-04-30 NOTE — Telephone Encounter (Signed)
Patient called stating he has an [employer]PA[employer] re-enrollment packet for [facility]. It needs to be sent back by December 15th.  He states there is area on the form that states to complete with your prescriber.

## 2021-04-30 NOTE — Telephone Encounter (Signed)
Called patient- he will be bringing his forms by to have them sent in- will route to primary RN to make aware and be on the lookout for forms.

## 2021-05-01 LAB — CUP PACEART REMOTE DEVICE CHECK
Battery Remaining Longevity: 97 mo
Battery Voltage: 3 V
Brady Statistic RV Percent Paced: 0.01 %
Date Time Interrogation Session: 20221121033322
HighPow Impedance: 68 Ohm
Implantable Lead Implant Date: 20180905
Implantable Lead Location: 753860
Implantable Pulse Generator Implant Date: 20180905
Lead Channel Impedance Value: 361 Ohm
Lead Channel Impedance Value: 418 Ohm
Lead Channel Pacing Threshold Amplitude: 0.5 V
Lead Channel Pacing Threshold Pulse Width: 0.4 ms
Lead Channel Sensing Intrinsic Amplitude: 10.875 mV
Lead Channel Sensing Intrinsic Amplitude: 10.875 mV
Lead Channel Setting Pacing Amplitude: 2.5 V
Lead Channel Setting Pacing Pulse Width: 0.4 ms
Lead Channel Setting Sensing Sensitivity: 0.3 mV

## 2021-05-01 NOTE — Progress Notes (Signed)
EPIC Encounter for ICM Monitoring  Patient Name: Kevin Patel is a 75 y.o. male Date: 05/01/2021 Primary Care Physican: [name]., MD Primary Cardiologist: Croitoru Electrophysiologist: Croitoru 05/01/2021 Weight: 260 lbs                                                            Spoke with patient and heart failure questions reviewed.  Pt asymptomatic for fluid accumulation and feeling well.  He has recovered from total knee surgery and back to bowling.    Optivol thoracic impedance suggesting normal fluid levels.    Prescribed: No diuretic   Labs: 01/31/2021 Creatinine 1.22, BUN 30, Potassium 4.4, Sodium 137, GFR >60 01/30/2021 Creatinine 1.14, BUN 21, Potassium 4.3, Sodium 137, GFR >60  01/18/2021 Creatinine 1.09, BUN 16, Potassium 4.4, Sodium 140, GFR >60  12/06/2020 Creatinine 1.63, BUN 25, Potassium 3.9, Sodium 142, GFR 44 A complete set of results can be found in Results Review.   Recommendations: No changes and encouraged to call if experiencing any fluid symptoms.   Follow-up plan:  ICM clinic phone appointment 06/12/2021.   91 day device clinic remote transmission 05/02/2021.     EP/Cardiology Office Visits:  Recall 06/21/2021 with Dr. Royann Shivers.     Copy of ICM check sent to Dr. Royann Shivers.     3 month ICM trend: 04/30/2021.    12-14 Month ICM trend:       Karie Soda, RN 05/01/2021 11:58 AM

## 2021-05-02 ENCOUNTER — Ambulatory Visit (INDEPENDENT_AMBULATORY_CARE_PROVIDER_SITE_OTHER): Payer: Medicare Other

## 2021-05-02 DIAGNOSIS — I255 Ischemic cardiomyopathy: Secondary | ICD-10-CM

## 2021-05-05 ENCOUNTER — Other Ambulatory Visit: Payer: Self-pay | Admitting: Cardiovascular Disease

## 2021-05-06 LAB — CUP PACEART REMOTE DEVICE CHECK
Battery Remaining Longevity: 95 mo
Battery Voltage: 2.99 V
Brady Statistic RV Percent Paced: 0 %
Date Time Interrogation Session: 20221123012205
HighPow Impedance: 72 Ohm
Implantable Lead Implant Date: 20180905
Implantable Lead Location: 753860
Implantable Pulse Generator Implant Date: 20180905
Lead Channel Impedance Value: 361 Ohm
Lead Channel Impedance Value: 456 Ohm
Lead Channel Pacing Threshold Amplitude: 0.5 V
Lead Channel Pacing Threshold Pulse Width: 0.4 ms
Lead Channel Sensing Intrinsic Amplitude: 10.75 mV
Lead Channel Sensing Intrinsic Amplitude: 10.75 mV
Lead Channel Setting Pacing Amplitude: 2.5 V
Lead Channel Setting Pacing Pulse Width: 0.4 ms
Lead Channel Setting Sensing Sensitivity: 0.3 mV

## 2021-05-14 NOTE — Progress Notes (Signed)
Remote ICD transmission.   

## 2021-05-17 ENCOUNTER — Telehealth: Payer: Self-pay | Admitting: *Deleted

## 2021-05-17 MED ORDER — SACUBITRIL-VALSARTAN 49-51 MG PO TABS: 1.0000 | ORAL_TABLET | Freq: Two times a day (BID) | ORAL | 3 refills | Status: DC

## 2021-05-17 NOTE — Telephone Encounter (Signed)
Entresto assistance faxed to [employer].  

## 2021-06-07 ENCOUNTER — Other Ambulatory Visit: Payer: Self-pay | Admitting: Cardiovascular Disease

## 2021-06-12 ENCOUNTER — Ambulatory Visit (INDEPENDENT_AMBULATORY_CARE_PROVIDER_SITE_OTHER): Payer: Medicare Other

## 2021-06-12 DIAGNOSIS — Z95810 Presence of automatic (implantable) cardiac defibrillator: Secondary | ICD-10-CM

## 2021-06-12 DIAGNOSIS — I5022 Chronic systolic (congestive) heart failure: Secondary | ICD-10-CM

## 2021-06-15 NOTE — Progress Notes (Signed)
EPIC Encounter for ICM Monitoring  Patient Name: Kevin Patel is a 76 y.o. male Date: 06/15/2021 Primary Care Physican: [name]., MD Primary Cardiologist: Croitoru Electrophysiologist: Croitoru 06/15/2021 Weight: 260 lbs                                                            Spoke with patient and heart failure questions reviewed.  Pt asymptomatic for fluid accumulation and feeling well.     Optivol thoracic impedance suggesting normal fluid levels.    Prescribed: No diuretic   Labs: 01/31/2021 Creatinine 1.22, BUN 30, Potassium 4.4, Sodium 137, GFR >60 01/30/2021 Creatinine 1.14, BUN 21, Potassium 4.3, Sodium 137, GFR >60  01/18/2021 Creatinine 1.09, BUN 16, Potassium 4.4, Sodium 140, GFR >60  12/06/2020 Creatinine 1.63, BUN 25, Potassium 3.9, Sodium 142, GFR 44 A complete set of results can be found in Results Review.   Recommendations: No changes and encouraged to call if experiencing any fluid symptoms.   Follow-up plan:  ICM clinic phone appointment 08/02/2021.   91 day device clinic remote transmission 08/01/2021.     EP/Cardiology Office Visits:  07/16/2021 with Dr. Royann Shivers.     Copy of ICM check sent to Dr. Royann Shivers.     3 month ICM trend: 06/12/2021.    12-14 Month ICM trend:     Karie Soda, RN 06/15/2021 2:30 PM

## 2021-06-20 NOTE — Telephone Encounter (Signed)
Entresto assistance has been approved through 06/09/22

## 2021-07-05 ENCOUNTER — Other Ambulatory Visit: Payer: Self-pay | Admitting: Cardiovascular Disease

## 2021-07-16 ENCOUNTER — Encounter: Payer: Self-pay | Admitting: Cardiovascular Disease

## 2021-07-16 ENCOUNTER — Other Ambulatory Visit: Payer: Self-pay

## 2021-07-16 ENCOUNTER — Ambulatory Visit (INDEPENDENT_AMBULATORY_CARE_PROVIDER_SITE_OTHER): Payer: Medicare Other | Admitting: Cardiovascular Disease

## 2021-07-16 VITALS — BP 132/78 | HR 55 | Ht 76.0 in | Wt 266.4 lb

## 2021-07-16 DIAGNOSIS — I4902 Ventricular flutter: Secondary | ICD-10-CM

## 2021-07-16 DIAGNOSIS — I5022 Chronic systolic (congestive) heart failure: Secondary | ICD-10-CM | POA: Diagnosis not present

## 2021-07-16 DIAGNOSIS — E1169 Type 2 diabetes mellitus with other specified complication: Secondary | ICD-10-CM | POA: Insufficient documentation

## 2021-07-16 DIAGNOSIS — I2510 Atherosclerotic heart disease of native coronary artery without angina pectoris: Secondary | ICD-10-CM

## 2021-07-16 DIAGNOSIS — Z95810 Presence of automatic (implantable) cardiac defibrillator: Secondary | ICD-10-CM

## 2021-07-16 DIAGNOSIS — I10 Essential (primary) hypertension: Secondary | ICD-10-CM | POA: Diagnosis not present

## 2021-07-16 DIAGNOSIS — E669 Obesity, unspecified: Secondary | ICD-10-CM

## 2021-07-16 NOTE — Progress Notes (Signed)
Cardiology Office Note:    Date:  07/16/2021   ID:  Kevin Patel, DOB 04/16/1946, MRN [id number]  PCP:  [name]., MD  Cardiologist:  [name], MD   Referring MD: [name]., MD   Chief Complaint  Patient presents with   ICD check   Irregular Heart Beat   Congestive Heart Failure         History of Present Illness:    Kevin Patel is a 76 y.o. male with a hx of longstanding CAD (Synergy DES 4x16 to ostial LAD 09/05/2016) and severe ischemic cardiomyopathy (25-30% by last echo 12/29/2020, 26% by QGS), CHF (NYHA class 1-2), one episode of sustained VFlutter w appropriate ICD shock in June 2022, HTN, HLP, DM . Previously reported LV apical thrombus has not been seen on his recent echos since 2012 or his recent LV angiogram. He has a single lead Medtronic VISIA AF device, MRI conditional.  The lead is a Medtronic [id number].    The patient specifically denies any chest pain at rest or with exertion, dyspnea at rest or with exertion, orthopnea, paroxysmal nocturnal dyspnea, syncope, palpitations, focal neurological deficits, intermittent claudication, lower extremity edema, unexplained weight gain, cough, hemoptysis or wheezing.  He had elective total knee replacement on August 22. His optivol showed a brief deviation at that time, now back to baseline.   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   Arthritis    knees   Chronic systolic HF (heart failure) (HCC)    Coronary artery disease    Diabetes mellitus with coincident hypertension (HCC) 09/09/2016   History of kidney stones    HTN (hypertension)    Hyperlipidemia    Ischemic cardiomyopathy    MI (myocardial infarction) (HCC) 1998   2018,   Mural thrombus of heart    on coumadin until follow up echo with contrast in Sept 13, 2012 which showed no thrombus and coumadin was d/c   Pre-diabetes     Past Surgical History:  Procedure Laterality Date    CORONARY ANGIOPLASTY  06/10/1996   PCI   CORONARY STENT INTERVENTION N/A 09/05/2016   Procedure: Coronary Stent Intervention;  Surgeon: [name], MD;  Location: [facility] INVASIVE CV LAB;  Service: Cardiovascular;  Laterality: N/A;   HERNIA REPAIR  2004   ICD IMPLANT N/A 02/12/2017   Procedure: ICD Implant;  Surgeon: [name], MD;  Location: [facility] INVASIVE CV LAB;  Service: Cardiovascular;  Laterality: N/A;   INTRAVASCULAR ULTRASOUND/IVUS N/A 09/05/2016   Procedure: Intravascular Ultrasound/IVUS;  Surgeon: [name], MD;  Location: [facility] INVASIVE CV LAB;  Service: Cardiovascular;  Laterality: N/A;   LEFT HEART CATH AND CORONARY ANGIOGRAPHY N/A 09/05/2016   Procedure: Left Heart Cath and Coronary Angiography;  Surgeon: [name], MD;  Location: [facility] INVASIVE CV LAB;  Service: Cardiovascular;  Laterality: N/A;   TOTAL KNEE ARTHROPLASTY Left 01/29/2021   Procedure: TOTAL KNEE ARTHROPLASTY;  Surgeon: Ollen Gross, MD;  Location: [facility] ORS;  Service: Orthopedics;  Laterality: Left;    Current Medications: Current Meds  Medication Sig   amLODipine (NORVASC) 5 MG tablet Take 1 tablet by mouth once daily   ascorbic acid (VITAMIN C) 1000 MG tablet Take 1,000 mg by mouth daily.   aspirin EC 81 MG tablet Take 81 mg by mouth daily. Swallow whole.   atorvastatin (LIPITOR) 80 MG tablet Take 1 tablet by mouth once daily   carvedilol (COREG) 25 MG tablet TAKE 1  TABLET BY MOUTH TWICE DAILY WITH A MEAL   Cholecalciferol 25 MCG (1000 UT) tablet Take 1,000 Units by mouth daily.   co-enzyme Q-10 30 MG capsule Take 30 mg by mouth 3 (three) times daily.   Multiple Vitamins-Minerals (MENS 50+ MULTIVITAMIN PO) Take 1 capsule by mouth daily in the afternoon.   sacubitril-valsartan (ENTRESTO) 49-51 MG Take 1 tablet by mouth 2 (two) times daily.     Allergies:   Patient has no known allergies.   Social History   Socioeconomic History   Marital status: Married    Spouse name: Not  on file   Number of children: Not on file   Years of education: Not on file   Highest education level: Not on file  Occupational History   Not on file  Tobacco Use   Smoking status: Former    Packs/day: 1.00    Years: 20.00    Pack years: 20.00    Types: Cigarettes    Quit date: 02/12/1993    Years since quitting: 28.4   Smokeless tobacco: Never  Vaping Use   Vaping Use: Never used  Substance and Sexual Activity   Alcohol use: No   Drug use: No   Sexual activity: Not on file  Other Topics Concern   Not on file  Social History Narrative   Not on file   Social Determinants of Health   Financial Resource Strain: Not on file  Food Insecurity: Not on file  Transportation Needs: Not on file  Physical Activity: Not on file  Stress: Not on file  Social Connections: Not on file     Family History: The patient's family history includes Emphysema in his father. ROS:   Please see the history of present illness.    All other systems are reviewed and are negative.  EKGs/Labs/Other Studies Reviewed:    The following studies were reviewed today: Last echocardiogram and coronary angiograms Comprehensive check of his ICD today.  EKG: Is ordered today and personally reviewed.  It shows sinus brady 55 bpm, old anteroseptal and lateral MI, left axis deviation/LAFB. QTc 417 ms.  Recent Labs: 12/06/2020: BNP 469.1; Magnesium 2.0 01/18/2021: ALT 19 01/31/2021: BUN 30; Creatinine, Ser 1.22; Hemoglobin 10.0; Platelets 139; Potassium 4.4; Sodium 137   06/21/2021 Creat 1.08, K 4.3, Hgb 14.3,  normal LFTs  Recent Lipid Panel    Component Value Date/Time   CHOL 119 07/22/2018 1104   TRIG 92 01/26/2020 2028   HDL 40 07/22/2018 1104   CHOLHDL 3.0 07/22/2018 1104   CHOLHDL 2.8 09/16/2016 0949   VLDL 22 09/16/2016 0949   LDLCALC 55 07/22/2018 1104   06/21/2021 Total cholesterol 119, triglycerides 133, HDL 45, LDL 60, A1c   Physical Exam:    VS:  BP 132/78 (BP Location: Left Arm,  Patient Position: Sitting, Cuff Size: Large)    Pulse (!) 55    Ht 6\' 4"  (1.93 m)    Wt 266 lb 6.4 oz (120.8 kg)    SpO2 98%    BMI 32.43 kg/m     Wt Readings from Last 3 Encounters:  07/16/21 266 lb 6.4 oz (120.8 kg)  01/29/21 262 lb 3.2 oz (118.9 kg)  01/22/21 262 lb 3.2 oz (118.9 kg)      General: Alert, oriented x3, no distress, normal left subclavian ICD site Head: no evidence of trauma, PERRL, EOMI, no exophtalmos or lid lag, no myxedema, no xanthelasma; normal ears, nose and oropharynx Neck: normal jugular venous pulsations and no hepatojugular reflux;  brisk carotid pulses without delay and no carotid bruits Chest: clear to auscultation, no signs of consolidation by percussion or palpation, normal fremitus, symmetrical and full respiratory excursions Cardiovascular: normal position and quality of the apical impulse, regular rhythm, normal first and second heart sounds, no murmurs, rubs or gallops Abdomen: no tenderness or distention, no masses by palpation, no abnormal pulsatility or arterial bruits, normal bowel sounds, no hepatosplenomegaly Extremities: no clubbing, cyanosis or edema; 2+ radial, ulnar and brachial pulses bilaterally; 2+ right femoral, posterior tibial and dorsalis pedis pulses; 2+ left femoral, posterior tibial and dorsalis pedis pulses; no subclavian or femoral bruits Neurological: grossly nonfocal Psych: Normal mood and affect   ASSESSMENT:    1. Chronic systolic heart failure (HCC)   2. Ventricular flutter (HCC)   3. Coronary artery disease involving native coronary artery of native heart without angina pectoris   4. Essential hypertension   5. Diabetes mellitus type 2 in obese (HCC)   6. ICD (implantable cardioverter-defibrillator) in place   7. Mild obesity       PLAN:    In order of problems listed above:  Ventricular flutter:   a single event occurred in June 2022 and was immediately and appropriately treated by his defibrillator.  No recurrent  events and we never identified a clear cause.  History of follow-up nuclear stress testing showed low risk findings and labs were normal.   Carvedilol dose was increased.  Sherryll Burger was added instead of his previous RAAS inhibitor.  Stressed the importance of compliance with beta-blocker without interruption.   CHF: Euvolemic, NYHA class I. Asymptomatic, so benefit of SGLT2 less clear and expense is an issue. Managed to get assistance for [facility].  CAD: No angina. Extensive scar, but no active ischemia on recent nuclear stress test.  S/p anterior MI 1998, s/p LAD-DES 09/05/2016.  On aspirin and beta-blocker.  HTN: controlled. DM: Good control, A1c 6.5%. HLP: on statin, lipids at target ICD: normal device function by comprehensive office check today. Occasional issues with remote downloads, will have to trouble shoot (next download Feb 22). Obese: needs to work on weight loss. S/p L TKR: completed rehab.   Medication Adjustments/Labs and Tests Ordered: Current medicines are reviewed at length with the patient today.  Concerns regarding medicines are outlined above.  Orders Placed This Encounter  Procedures   EKG 12-Lead   Patient Instructions  Medication Instructions:  No changes *If you need a refill on your cardiac medications before your next appointment, please call your pharmacy*   Lab Work: None ordered If you have labs (blood work) drawn today and your tests are completely normal, you will receive your results only by: MyChart Message (if you have MyChart) OR A paper copy in the mail If you have any lab test that is abnormal or we need to change your treatment, we will call you to review the results.   Testing/Procedures: None ordered   Follow-Up: At [facility], you and your health needs are our priority.  As part of our continuing mission to provide you with exceptional heart care, we have created designated Provider Care Teams.  These Care Teams include your primary  Cardiologist (physician) and Advanced Practice Providers (APPs -  Physician Assistants and Nurse Practitioners) who all work together to provide you with the care you need, when you need it.  We recommend signing up for the patient portal called "MyChart".  Sign up information is provided on this After Visit Summary.  MyChart is used to connect  with patients for Virtual Visits (Telemedicine).  Patients are able to view lab/test results, encounter notes, upcoming appointments, etc.  Non-urgent messages can be sent to your provider as well.   To learn more about what you can do with MyChart, go to [email or web address].    Your next appointment:   12 month(s)  The format for your next appointment:   In Person  Provider:   [name], MD   Signed, [name], MD  07/16/2021 10:58 AM    [facility]

## 2021-07-16 NOTE — Patient Instructions (Signed)

## 2021-07-31 LAB — CUP PACEART REMOTE DEVICE CHECK
Battery Remaining Longevity: 91 mo
Battery Voltage: 2.99 V
Brady Statistic RV Percent Paced: 0.01 %
Date Time Interrogation Session: 20230221031803
HighPow Impedance: 62 Ohm
Implantable Lead Implant Date: 20180905
Implantable Lead Location: 753860
Implantable Pulse Generator Implant Date: 20180905
Lead Channel Impedance Value: 361 Ohm
Lead Channel Impedance Value: 399 Ohm
Lead Channel Pacing Threshold Amplitude: 0.5 V
Lead Channel Pacing Threshold Pulse Width: 0.4 ms
Lead Channel Sensing Intrinsic Amplitude: 10 mV
Lead Channel Sensing Intrinsic Amplitude: 10 mV
Lead Channel Setting Pacing Amplitude: 2.5 V
Lead Channel Setting Pacing Pulse Width: 0.4 ms
Lead Channel Setting Sensing Sensitivity: 0.3 mV

## 2021-08-01 ENCOUNTER — Ambulatory Visit (INDEPENDENT_AMBULATORY_CARE_PROVIDER_SITE_OTHER): Payer: Medicare Other

## 2021-08-01 DIAGNOSIS — I255 Ischemic cardiomyopathy: Secondary | ICD-10-CM

## 2021-08-03 ENCOUNTER — Ambulatory Visit (INDEPENDENT_AMBULATORY_CARE_PROVIDER_SITE_OTHER): Payer: Medicare Other

## 2021-08-03 DIAGNOSIS — I5022 Chronic systolic (congestive) heart failure: Secondary | ICD-10-CM | POA: Diagnosis not present

## 2021-08-03 DIAGNOSIS — Z95810 Presence of automatic (implantable) cardiac defibrillator: Secondary | ICD-10-CM

## 2021-08-03 NOTE — Progress Notes (Signed)
EPIC Encounter for ICM Monitoring  Patient Name: Kevin Patel is a 76 y.o. male Date: 08/03/2021 Primary Care Physican: [name]., MD Primary Cardiologist: Croitoru Electrophysiologist: Croitoru 08/03/2021 Weight: 260 lbs                                                            Spoke with patient and heart failure questions reviewed.  Pt asymptomatic for fluid accumulation and feeling well.  Explained Dr Royann Shivers had requested ICM monthly follow up for 2-3 months and since he fluid levels have been stable will return to every 3 months unless Dr Royann Shivers advises differently.     Optivol thoracic impedance normal but was suggesting possible fluid accumulation from 2/14-2/18.   Prescribed: No diuretic   Labs: 01/31/2021 Creatinine 1.22, BUN 30, Potassium 4.4, Sodium 137, GFR >60 01/30/2021 Creatinine 1.14, BUN 21, Potassium 4.3, Sodium 137, GFR >60  01/18/2021 Creatinine 1.09, BUN 16, Potassium 4.4, Sodium 140, GFR >60  12/06/2020 Creatinine 1.63, BUN 25, Potassium 3.9, Sodium 142, GFR 44 A complete set of results can be found in Results Review.   Recommendations: No changes and encouraged to call if experiencing any fluid symptoms.   Follow-up plan: Explained remote monitoring will return to every 3 months unless Dr Royann Shivers advised differently.    91 day device clinic remote transmission 10/31/2021.     EP/Cardiology Office Visits:  Recall 07/11/2022 with Dr. Royann Shivers.     Copy of ICM check sent to Dr. Royann Shivers.  3 month ICM trend: 08/03/2021.    12-14 Month ICM trend:     Karie Soda, RN 08/03/2021 12:56 PM

## 2021-08-05 ENCOUNTER — Other Ambulatory Visit: Payer: Self-pay | Admitting: Cardiovascular Disease

## 2021-08-06 NOTE — Progress Notes (Signed)
That is great. Let's go back to every 3 months, please.

## 2021-08-08 NOTE — Progress Notes (Signed)
Remote ICD transmission.   

## 2021-09-07 ENCOUNTER — Other Ambulatory Visit: Payer: Self-pay | Admitting: Cardiovascular Disease

## 2021-10-31 ENCOUNTER — Ambulatory Visit (INDEPENDENT_AMBULATORY_CARE_PROVIDER_SITE_OTHER): Payer: Medicare Other

## 2021-10-31 DIAGNOSIS — I255 Ischemic cardiomyopathy: Secondary | ICD-10-CM

## 2021-10-31 LAB — CUP PACEART REMOTE DEVICE CHECK
Battery Remaining Longevity: 90 mo
Battery Voltage: 3 V
Brady Statistic RV Percent Paced: 0.01 %
Date Time Interrogation Session: 20230523043823
HighPow Impedance: 65 Ohm
Implantable Lead Implant Date: 20180905
Implantable Lead Location: 753860
Implantable Pulse Generator Implant Date: 20180905
Lead Channel Impedance Value: 342 Ohm
Lead Channel Impedance Value: 399 Ohm
Lead Channel Pacing Threshold Amplitude: 0.5 V
Lead Channel Pacing Threshold Pulse Width: 0.4 ms
Lead Channel Sensing Intrinsic Amplitude: 10.125 mV
Lead Channel Sensing Intrinsic Amplitude: 10.125 mV
Lead Channel Setting Pacing Amplitude: 2.5 V
Lead Channel Setting Pacing Pulse Width: 0.4 ms
Lead Channel Setting Sensing Sensitivity: 0.3 mV

## 2021-11-13 NOTE — Progress Notes (Signed)
Remote ICD transmission.   

## 2021-12-08 ENCOUNTER — Other Ambulatory Visit: Payer: Self-pay | Admitting: Cardiovascular Disease

## 2022-01-30 ENCOUNTER — Ambulatory Visit (INDEPENDENT_AMBULATORY_CARE_PROVIDER_SITE_OTHER): Payer: Medicare Other

## 2022-01-30 DIAGNOSIS — I255 Ischemic cardiomyopathy: Secondary | ICD-10-CM | POA: Diagnosis not present

## 2022-01-31 LAB — CUP PACEART REMOTE DEVICE CHECK
Battery Remaining Longevity: 84 mo
Battery Voltage: 2.99 V
Brady Statistic RV Percent Paced: 0.01 %
Date Time Interrogation Session: 20230822033525
HighPow Impedance: 70 Ohm
Implantable Lead Implant Date: 20180905
Implantable Lead Location: 753860
Implantable Pulse Generator Implant Date: 20180905
Lead Channel Impedance Value: 342 Ohm
Lead Channel Impedance Value: 399 Ohm
Lead Channel Pacing Threshold Amplitude: 0.5 V
Lead Channel Pacing Threshold Pulse Width: 0.4 ms
Lead Channel Sensing Intrinsic Amplitude: 10.875 mV
Lead Channel Sensing Intrinsic Amplitude: 10.875 mV
Lead Channel Setting Pacing Amplitude: 2.5 V
Lead Channel Setting Pacing Pulse Width: 0.4 ms
Lead Channel Setting Sensing Sensitivity: 0.3 mV

## 2022-02-13 ENCOUNTER — Telehealth: Payer: Self-pay | Admitting: Cardiovascular Disease

## 2022-02-13 NOTE — Telephone Encounter (Signed)
Pt returning a call from Roderick Pee, RN for device results

## 2022-02-13 NOTE — Telephone Encounter (Signed)
Follow Up:      Patient saya he was retuning a call from yesterday from Dr's office, but did not know who called.

## 2022-02-13 NOTE — Telephone Encounter (Signed)
Called patient, advised that Misty Stanley, RN had contacted regarding remote transmission.   Advised patient to call back.   Left call back number.

## 2022-02-13 NOTE — Telephone Encounter (Signed)
Called patient, made aware of results.  Patient was scheduled in November-   Lisa, just check to make sure it was scheduled correctly on a device day.   Thanks!

## 2022-02-26 NOTE — Progress Notes (Signed)
Remote ICD transmission.   

## 2022-03-04 ENCOUNTER — Other Ambulatory Visit: Payer: Self-pay | Admitting: Cardiovascular Disease

## 2022-04-15 ENCOUNTER — Ambulatory Visit: Payer: Medicare Other | Attending: Cardiovascular Disease | Admitting: Cardiovascular Disease

## 2022-04-15 ENCOUNTER — Encounter: Payer: Self-pay | Admitting: Cardiovascular Disease

## 2022-04-15 VITALS — BP 126/80 | HR 52 | Ht 76.0 in | Wt 262.8 lb

## 2022-04-15 DIAGNOSIS — I5022 Chronic systolic (congestive) heart failure: Secondary | ICD-10-CM | POA: Diagnosis not present

## 2022-04-15 DIAGNOSIS — I4902 Ventricular flutter: Secondary | ICD-10-CM | POA: Insufficient documentation

## 2022-04-15 DIAGNOSIS — I480 Paroxysmal atrial fibrillation: Secondary | ICD-10-CM | POA: Diagnosis not present

## 2022-04-15 DIAGNOSIS — E669 Obesity, unspecified: Secondary | ICD-10-CM | POA: Insufficient documentation

## 2022-04-15 DIAGNOSIS — Z95810 Presence of automatic (implantable) cardiac defibrillator: Secondary | ICD-10-CM | POA: Diagnosis present

## 2022-04-15 DIAGNOSIS — E7800 Pure hypercholesterolemia, unspecified: Secondary | ICD-10-CM | POA: Diagnosis present

## 2022-04-15 DIAGNOSIS — I255 Ischemic cardiomyopathy: Secondary | ICD-10-CM | POA: Diagnosis not present

## 2022-04-15 DIAGNOSIS — I10 Essential (primary) hypertension: Secondary | ICD-10-CM | POA: Diagnosis present

## 2022-04-15 DIAGNOSIS — I2510 Atherosclerotic heart disease of native coronary artery without angina pectoris: Secondary | ICD-10-CM | POA: Insufficient documentation

## 2022-04-15 DIAGNOSIS — E1169 Type 2 diabetes mellitus with other specified complication: Secondary | ICD-10-CM | POA: Insufficient documentation

## 2022-04-15 MED ORDER — APIXABAN 5 MG PO TABS: 5.0000 mg | ORAL_TABLET | Freq: Two times a day (BID) | ORAL | 3 refills | Status: DC

## 2022-04-15 NOTE — Patient Instructions (Signed)
Medication Instructions:  Your physician has recommended you make the following change in your medication:   -Stop aspirin.  -Start taking apixaban (eliquis) 5mg  twice daily.   *If you need a refill on your cardiac medications before your next appointment, please call your pharmacy*   Follow-Up: At [facility], you and your health needs are our priority.  As part of our continuing mission to provide you with exceptional heart care, we have created designated Provider Care Teams.  These Care Teams include your primary Cardiologist (physician) and Advanced Practice Providers (APPs -  Physician Assistants and Nurse Practitioners) who all work together to provide you with the care you need, when you need it.  We recommend signing up for the patient portal called "MyChart".  Sign up information is provided on this After Visit Summary.  MyChart is used to connect with patients for Virtual Visits (Telemedicine).  Patients are able to view lab/test results, encounter notes, upcoming appointments, etc.  Non-urgent messages can be sent to your provider as well.   To learn more about what you can do with MyChart, go to [email or web address].    Your next appointment:   3 month(s)  The format for your next appointment:   In Person  Provider:   Sanda Klein, MD

## 2022-04-15 NOTE — Progress Notes (Signed)
Cardiology Office Note:    Date:  04/15/2022   ID:  SYLAR VOONG, DOB 03/16/1946, MRN [id number]  PCP:  Raina Mina., MD  Cardiologist:  Sanda Klein, MD   Referring MD: Raina Mina., MD   Chief Complaint  Patient presents with   Congestive Heart Failure    History of Present Illness:    Kevin Patel is a 76 y.o. male with a hx of longstanding CAD (Synergy DES 4x16 to ostial LAD 09/05/2016) and severe ischemic cardiomyopathy (25-30% by last echo 12/29/2020, 26% by QGS), CHF (NYHA class 1-2), one episode of sustained VFlutter w appropriate ICD shock in June 2022, HTN, HLP, DM . Previously reported LV apical thrombus has not been seen on his recent echos since 2012 or his recent LV angiogram. He has a single lead Medtronic VISIA AF device, MRI conditional.  The lead is a Medtronic [id number].    From a cardiac symptom point of view that he has had a good year.  Although he is not as active as he has been in the past he remains fairly active for 76 year old.  He has NYHA functional class II exertional dyspnea.  He does not have lower extremity edema, PND orthopnea.  He denies chest pain at rest or with activity.  He has not been aware of any palpitations.  He denies dizziness or syncope.  He has not had any defibrillator shocks since June 2022.  He has not had stroke/TIA or bleeding problems.  In the past he did have some issues with rectal bleeding from what sounds like an anal fissure, but none in the last couple of years.  He had a knee replacement about a year ago from which he's recovered well.  Pacemaker interrogation shows normal device function.  Estimated generator longevity 6.5 years.  He only has 0.4% ventricular pacing.  He has not had any episodes of sustained or nonsustained VT since his last download.  On the other hand his device shows 3 separate episodes of paroxysmal atrial fibrillation occurring in July and August of this year lasting between 1 and 2 hours each.  There was  mild RVR with these episodes, with rates typically in the 80s-120s.  The overall burden of atrial fibrillation was low, less than 0.1%.    Baseline ECG shows sinus bradycardia 52 bpm with an incomplete left bundle branch block.  QTc is normal at 407 ms  Most recent LVEF 25-30% by echo in July 2022.  Extensive scar but no ischemia on nuclear stress test in July 2022.  He is on high-dose carvedilol.  He is on moderate dose Entresto but is still taking some amlodipine.  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   Arthritis    knees   Chronic systolic HF (heart failure) ([place])    Coronary artery disease    Diabetes mellitus with coincident hypertension ([place]) 09/09/2016   History of kidney stones    HTN (hypertension)    Hyperlipidemia    Ischemic cardiomyopathy    MI (myocardial infarction) ([place]) 1998   2018,   Mural thrombus of heart    on coumadin until follow up echo with contrast in Sept 13, 2012 which showed no thrombus and coumadin was d/c   Pre-diabetes     Past Surgical History:  Procedure Laterality Date   CORONARY ANGIOPLASTY  06/10/1996   PCI   CORONARY STENT INTERVENTION N/A 09/05/2016   Procedure: Coronary Stent Intervention;  Surgeon:  [name], MD;  [facility] INVASIVE CV LAB;  Service: Cardiovascular;  Laterality: N/A;   HERNIA REPAIR  2004   ICD IMPLANT N/A 02/12/2017   Procedure: ICD Implant;  Surgeon: [name], MD;  Location: [facility] INVASIVE CV LAB;  Service: Cardiovascular;  Laterality: N/A;   INTRAVASCULAR ULTRASOUND/IVUS N/A 09/05/2016   Procedure: Intravascular Ultrasound/IVUS;  Surgeon: [name], MD;  Location: [facility] INVASIVE CV LAB;  Service: Cardiovascular;  Laterality: N/A;   LEFT HEART CATH AND CORONARY ANGIOGRAPHY N/A 09/05/2016   Procedure: Left Heart Cath and Coronary Angiography;  Surgeon: [name], MD;  Location: [facility] INVASIVE CV LAB;  Service:  Cardiovascular;  Laterality: N/A;   TOTAL KNEE ARTHROPLASTY Left 01/29/2021   Procedure: TOTAL KNEE ARTHROPLASTY;  Surgeon: Ollen Gross, MD;  Location: [facility] ORS;  Service: Orthopedics;  Laterality: Left;    Current Medications: Current Meds  Medication Sig   amLODipine (NORVASC) 5 MG tablet Take 1 tablet by mouth once daily   apixaban (ELIQUIS) 5 MG TABS tablet Take 1 tablet (5 mg total) by mouth 2 (two) times daily.   ascorbic acid (VITAMIN C) 1000 MG tablet Take 1,000 mg by mouth daily.   atorvastatin (LIPITOR) 80 MG tablet Take 1 tablet by mouth once daily   carvedilol (COREG) 25 MG tablet TAKE 1 TABLET BY MOUTH TWICE DAILY WITH A MEAL   Cholecalciferol 25 MCG (1000 UT) tablet Take 1,000 Units by mouth daily.   Multiple Vitamins-Minerals (MENS 50+ MULTIVITAMIN PO) Take 1 capsule by mouth daily in the afternoon.   sacubitril-valsartan (ENTRESTO) 49-51 MG Take 1 tablet by mouth 2 (two) times daily.   [DISCONTINUED] aspirin EC 81 MG tablet Take 81 mg by mouth daily. Swallow whole.     Allergies:   Patient has no known allergies.   Social History   Socioeconomic History   Marital status: Married    Spouse name: Not on file   Number of children: Not on file   Years of education: Not on file   Highest education level: Not on file  Occupational History   Not on file  Tobacco Use   Smoking status: Former    Packs/day: 1.00    Years: 20.00    Total pack years: 20.00    Types: Cigarettes    Quit date: 02/12/1993    Years since quitting: 29.1   Smokeless tobacco: Never  Vaping Use   Vaping Use: Never used  Substance and Sexual Activity   Alcohol use: No   Drug use: No   Sexual activity: Not on file  Other Topics Concern   Not on file  Social History Narrative   Not on file   Social Determinants of Health   Financial Resource Strain: Not on file  Food Insecurity: Not on file  Transportation Needs: Not on file  Physical Activity: Not on file  Stress: Not on file  Social  Connections: Not on file     Family History: The patient's family history includes Emphysema in his father. ROS:   Please see the history of present illness.    All other systems are reviewed and are negative.  EKGs/Labs/Other Studies Reviewed:    The following studies were reviewed today: Echocardiogram 12/29/2020: d apical akinesis consistent with  previous anterior MI      No mural apical thrombus by definity      Previous echo not online for comparision But described as EF 30-35% .  Left ventricular ejection fraction, by estimation, is 25  to 30%. The left  ventricle has severely decreased function. The left ventricle has no  regional wall motion abnormalities.  The left ventricular internal cavity size was severely dilated. Left  ventricular diastolic parameters were normal.   2. Device catheter in RA/RV. Right ventricular systolic function is  normal. The right ventricular size is normal.   3. Left atrial size was moderately dilated.   4. Right atrial size was mildly dilated.   5. The mitral valve is abnormal. Mild mitral valve regurgitation. No  evidence of mitral stenosis.   6. The aortic valve is tricuspid. There is mild calcification of the  aortic valve. Aortic valve regurgitation is trivial. Mild aortic valve  sclerosis is present, with no evidence of aortic valve stenosis.   7. The inferior vena cava is normal in size with greater than 50%  respiratory variability, suggesting right atrial pressure of 3 mmHg.   Nuclear stress test 01/03/2021:  The left ventricular ejection fraction is severely decreased (<30%). Nuclear stress EF: 26%. There was no ST segment deviation noted during stress. There is a large, severe, non-reversible defect in all anterior, inferior, anteroseptal, apical LV segments and apex. There is relative sparing of the lateral LV wall (except apical portion) and mid-inferoseptal LV wall. 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 This is a high risk study.  Comprehensive check of his ICD today.  3 episodes of brief paroxysmal atrial fibrillation with moderate RVR are seen, lasting 1-2 hours each.  EKG: Is ordered today and personally reviewed.  It shows sinus bradycardia 52 bpm, left ventricular hypertrophy/incomplete left bundle branch block with sequela of old anterolateral infarction, left axis deviation, ST segment depression and T wave inversion leads I and aVL.  QTc is normal at 407 ms.  Recent Labs: No results found for requested labs within last 365 days.   06/21/2021 Creat 1.08, K 4.3, Hgb 14.3,  normal LFTs  12/20/2021 Potassium 4.4, glucose 128, creatinine 1.2, normal liver function tests, normal iron studies, TSH 4.69, hemoglobin 14.1, platelets 160 K Hemoglobin A1c 6.6%  Recent Lipid Panel    Component Value Date/Time   CHOL 119 07/22/2018 1104   TRIG 92 01/26/2020 2028   HDL 40 07/22/2018 1104   CHOLHDL 3.0 07/22/2018 1104   CHOLHDL 2.8 09/16/2016 0949   VLDL 22 09/16/2016 0949   LDLCALC 55 07/22/2018 1104   06/21/2021 Total cholesterol 119, triglycerides 133, HDL 45, LDL 60, A1c   12/20/2021 Direct LDL 64, cholesterol 127, HDL 49, triglycerides 86 Physical Exam:    VS:  BP 126/80 (BP Location: Left Arm, Patient Position: Sitting, Cuff Size: Large)   Pulse (!) 52   Ht 6\' 4"  (1.93 m)   Wt 119.2 kg   SpO2 95%   BMI 31.99 kg/m     Wt Readings from Last 3 Encounters:  04/15/22 119.2 kg  07/16/21 120.8 kg  01/29/21 118.9 kg      General: Alert, oriented x3, no distress, mildly obese.  Healthy left subclavian pacemaker site. Head: no evidence of trauma, PERRL, EOMI, no exophtalmos or lid lag, no myxedema, no xanthelasma; normal ears, nose and oropharynx Neck: normal jugular venous pulsations and no hepatojugular reflux; brisk carotid pulses without delay and no carotid bruits Chest: clear to auscultation, no signs of consolidation by percussion or palpation, normal  fremitus, symmetrical and full respiratory excursions Cardiovascular: normal position and quality of the apical impulse, regular rhythm, normal first and second heart sounds, no murmurs, rubs or gallops Abdomen: no tenderness  or distention, no masses by palpation, no abnormal pulsatility or arterial bruits, normal bowel sounds, no hepatosplenomegaly Extremities: no clubbing, cyanosis or edema; 2+ radial, ulnar and brachial pulses bilaterally; 2+ right femoral, posterior tibial and dorsalis pedis pulses; 2+ left femoral, posterior tibial and dorsalis pedis pulses; no subclavian or femoral bruits Neurological: grossly nonfocal Psych: Normal mood and affect    ASSESSMENT:    1. Paroxysmal atrial fibrillation (HCC)   2. Ventricular flutter (HCC)   3. Chronic systolic heart failure (HCC)   4. Coronary artery disease involving native coronary artery of native heart without angina pectoris   5. Essential hypertension   6. Diabetes mellitus type 2 in obese (HCC)   7. Hypercholesterolemia   8. ICD (implantable cardioverter-defibrillator) in place        PLAN:    In order of problems listed above:  Paroxysmal atrial fibrillation: CHA2DS2-VASc 6 (age 2, CHF, CAD, DM, HTN) new diagnosis.  Asymptomatic.  Ventricular rate control is fair if not perfect.  Overall burden of arrhythmia is quite low and antiarrhythmics are not justified.  Start Eliquis and stop aspirin.  Discussed the risk/benefit ratio of anticoagulation.  In the past he took Coumadin for many years without serious bleeding complications other than occasional rectal bleeding.  Briefly reviewed option for watchman if bleeding recurs and is severe.   Ventricular flutter:   No new events in the last 12 months.  A single event occurred in June 2022 and was immediately and appropriately treated by his defibrillator.  No recurrent events and we never identified a clear cause.  History of follow-up nuclear stress testing showed low risk  findings and labs were normal.   Carvedilol dose was increased.  Sherryll Burger was added instead of his previous RAAS inhibitor.  Stressed the importance of compliance with beta-blocker without interruption.   CHF: Euvolemic, NYHA functional class I-2.  Have previously considered starting an SGLT2 inhibitor but he was worried about the cost.  Now we have to start an anticoagulant which is expensive, so we will delay discussing this.  Would like to increase the dose of Entresto add spironolactone and stop the amlodipine at his next appointment.  CAD: He does not have angina pectoris and did not have active ischemia on his nuclear stress test a year ago.   S/p anterior MI 1998, s/p LAD-DES 09/05/2016.  On aspirin and beta-blocker.  HTN: controlled.  Would prefer to stop the amlodipine and maximize the dose of Entresto, add spironolactone DM: Good control, A1c 6.6% HLP: LDL less than 70 in target range.  Continue statin. ICD: Normal device function.  Newly diagnosed atrial fibrillation (single lead device with A-fib detection algorithm). Obese: Recommended weight loss again.    Medication Adjustments/Labs and Tests Ordered: Current medicines are reviewed at length with the patient today.  Concerns regarding medicines are outlined above.  Orders Placed This Encounter  Procedures   EKG 12-Lead   Patient Instructions  Medication Instructions:  Your physician has recommended you make the following change in your medication:   -Stop aspirin.  -Start taking apixaban (eliquis) 5mg  twice daily.   *If you need a refill on your cardiac medications before your next appointment, please call your pharmacy*   Follow-Up: At [facility], you and your health needs are our priority.  As part of our continuing mission to provide you with exceptional heart care, we have created designated Provider Care Teams.  These Care Teams include your primary Cardiologist (physician) and Advanced Practice Providers  (  APPs -  Physician Assistants and Nurse Practitioners) who all work together to provide you with the care you need, when you need it.  We recommend signing up for the patient portal called "MyChart".  Sign up information is provided on this After Visit Summary.  MyChart is used to connect with patients for Virtual Visits (Telemedicine).  Patients are able to view lab/test results, encounter notes, upcoming appointments, etc.  Non-urgent messages can be sent to your provider as well.   To learn more about what you can do with MyChart, go to [email or web address].    Your next appointment:   3 month(s)  The format for your next appointment:   In Person  Provider:   [name], MD      Signed, [name], MD  04/15/2022 6:04 PM    [facility]

## 2022-05-01 ENCOUNTER — Ambulatory Visit (INDEPENDENT_AMBULATORY_CARE_PROVIDER_SITE_OTHER): Payer: Medicare Other

## 2022-05-01 DIAGNOSIS — I255 Ischemic cardiomyopathy: Secondary | ICD-10-CM

## 2022-05-01 DIAGNOSIS — Z95810 Presence of automatic (implantable) cardiac defibrillator: Secondary | ICD-10-CM

## 2022-05-01 LAB — CUP PACEART REMOTE DEVICE CHECK
Battery Remaining Longevity: 70 mo
Battery Voltage: 2.99 V
Brady Statistic RV Percent Paced: 0.01 %
Date Time Interrogation Session: 20231122022604
HighPow Impedance: 69 Ohm
Implantable Lead Connection Status: 753985
Implantable Lead Implant Date: 20180905
Implantable Lead Location: 753860
Implantable Pulse Generator Implant Date: 20180905
Lead Channel Impedance Value: 418 Ohm
Lead Channel Impedance Value: 418 Ohm
Lead Channel Pacing Threshold Amplitude: 0.5 V
Lead Channel Pacing Threshold Pulse Width: 0.4 ms
Lead Channel Sensing Intrinsic Amplitude: 11 mV
Lead Channel Sensing Intrinsic Amplitude: 11 mV
Lead Channel Setting Pacing Amplitude: 2.5 V
Lead Channel Setting Pacing Pulse Width: 0.4 ms
Lead Channel Setting Sensing Sensitivity: 0.3 mV
Zone Setting Status: 755011
Zone Setting Status: 755011

## 2022-05-06 ENCOUNTER — Other Ambulatory Visit: Payer: Self-pay | Admitting: Cardiovascular Disease

## 2022-05-06 ENCOUNTER — Telehealth: Payer: Self-pay | Admitting: Cardiovascular Disease

## 2022-05-06 NOTE — Telephone Encounter (Signed)
No answer

## 2022-05-06 NOTE — Telephone Encounter (Signed)
Pt c/o medication issue:  1. Name of Medication:    sacubitril-valsartan (ENTRESTO) 49-51 MG    2. How are you currently taking this medication (dosage and times per day)?   3. Are you having a reaction (difficulty breathing--STAT)?   4. What is your medication issue? Pt states he will be dropping off [employer] paperwork for Dr. Salena Saner to sign on Wednesday and wanted to make RN aware

## 2022-05-09 NOTE — Telephone Encounter (Signed)
Received assistance paperwork and faxed to foundation.

## 2022-05-23 NOTE — Progress Notes (Signed)
Remote ICD transmission.   

## 2022-06-04 ENCOUNTER — Other Ambulatory Visit: Payer: Self-pay | Admitting: Cardiovascular Disease

## 2022-06-18 ENCOUNTER — Other Ambulatory Visit: Payer: Self-pay

## 2022-06-18 MED ORDER — SACUBITRIL-VALSARTAN 49-51 MG PO TABS: 1.0000 | ORAL_TABLET | Freq: Two times a day (BID) | ORAL | 3 refills | Status: DC

## 2022-07-08 NOTE — Telephone Encounter (Signed)
Received fax from [employer]-application missing proof of income ([phone]), patient signature, and household size.  Patient aware to bring missing information to office.

## 2022-07-11 NOTE — Telephone Encounter (Signed)
Received proof on income and patient signature.   Refaxed to [employer]

## 2022-07-28 ENCOUNTER — Other Ambulatory Visit: Payer: Self-pay | Admitting: Cardiovascular Disease

## 2022-07-31 ENCOUNTER — Ambulatory Visit: Payer: Medicare Other | Attending: Cardiovascular Disease

## 2022-07-31 DIAGNOSIS — I255 Ischemic cardiomyopathy: Secondary | ICD-10-CM

## 2022-07-31 LAB — CUP PACEART REMOTE DEVICE CHECK
Battery Remaining Longevity: 75 mo
Battery Voltage: 2.99 V
Brady Statistic RV Percent Paced: 0.01 %
Date Time Interrogation Session: 20240221022822
HighPow Impedance: 69 Ohm
Implantable Lead Connection Status: 753985
Implantable Lead Implant Date: 20180905
Implantable Lead Location: 753860
Implantable Pulse Generator Implant Date: 20180905
Lead Channel Impedance Value: 399 Ohm
Lead Channel Impedance Value: 418 Ohm
Lead Channel Pacing Threshold Amplitude: 0.5 V
Lead Channel Pacing Threshold Pulse Width: 0.4 ms
Lead Channel Sensing Intrinsic Amplitude: 10.5 mV
Lead Channel Sensing Intrinsic Amplitude: 10.5 mV
Lead Channel Setting Pacing Amplitude: 2.5 V
Lead Channel Setting Pacing Pulse Width: 0.4 ms
Lead Channel Setting Sensing Sensitivity: 0.3 mV
Zone Setting Status: 755011
Zone Setting Status: 755011

## 2022-08-05 ENCOUNTER — Encounter: Payer: Self-pay | Admitting: Cardiovascular Disease

## 2022-08-05 ENCOUNTER — Ambulatory Visit: Payer: Medicare Other | Attending: Cardiovascular Disease | Admitting: Cardiovascular Disease

## 2022-08-05 VITALS — BP 132/74 | HR 55 | Ht 76.0 in | Wt 263.0 lb

## 2022-08-05 DIAGNOSIS — E669 Obesity, unspecified: Secondary | ICD-10-CM | POA: Diagnosis present

## 2022-08-05 DIAGNOSIS — I480 Paroxysmal atrial fibrillation: Secondary | ICD-10-CM | POA: Insufficient documentation

## 2022-08-05 DIAGNOSIS — E6609 Other obesity due to excess calories: Secondary | ICD-10-CM | POA: Diagnosis present

## 2022-08-05 DIAGNOSIS — I2510 Atherosclerotic heart disease of native coronary artery without angina pectoris: Secondary | ICD-10-CM | POA: Insufficient documentation

## 2022-08-05 DIAGNOSIS — E7800 Pure hypercholesterolemia, unspecified: Secondary | ICD-10-CM | POA: Insufficient documentation

## 2022-08-05 DIAGNOSIS — I5022 Chronic systolic (congestive) heart failure: Secondary | ICD-10-CM | POA: Diagnosis not present

## 2022-08-05 DIAGNOSIS — E1169 Type 2 diabetes mellitus with other specified complication: Secondary | ICD-10-CM | POA: Insufficient documentation

## 2022-08-05 DIAGNOSIS — I4902 Ventricular flutter: Secondary | ICD-10-CM | POA: Diagnosis not present

## 2022-08-05 DIAGNOSIS — Z6831 Body mass index (BMI) 31.0-31.9, adult: Secondary | ICD-10-CM | POA: Diagnosis present

## 2022-08-05 DIAGNOSIS — Z95810 Presence of automatic (implantable) cardiac defibrillator: Secondary | ICD-10-CM | POA: Insufficient documentation

## 2022-08-05 DIAGNOSIS — I10 Essential (primary) hypertension: Secondary | ICD-10-CM | POA: Diagnosis present

## 2022-08-05 MED ORDER — ENTRESTO 97-103 MG PO TABS: 1.0000 | ORAL_TABLET | Freq: Two times a day (BID) | ORAL | 3 refills | Status: DC

## 2022-08-05 NOTE — Progress Notes (Unsigned)
Cardiology Office Note:    Date:  08/08/2022   ID:  Kevin Patel, Kevin Patel 1945-06-29, MRN [id number]  PCP:  Raina Mina., MD  Cardiologist:  Sanda Klein, MD   Referring MD: Raina Mina., MD   No chief complaint on file.   History of Present Illness:    Kevin Patel is a 77 y.o. 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 Previously reported LV apical thrombus has not been seen on his recent echos since 2012 or his recent LV angiogram. He has a single lead Medtronic VISIA AF device, MRI conditional.  The lead is a Medtronic [id number].    He has been doing well and has no cardiovascular complaints.  He has trouble from him for the [facility] (we received information from the manufacturer that they are still waiting for information about them, but the members of the household before they approve his patient assistance).  The patient specifically denies any chest pain at rest or with usual exertion, dyspnea at rest or with exertion, orthopnea, paroxysmal nocturnal dyspnea, syncope, palpitations, focal neurological deficits, intermittent claudication, lower extremity edema, unexplained weight gain, cough, hemoptysis or wheezing.  He has not had any falls or bleeding problems (in the past he had rectal bleeding from anal fissure but none in about 3 years).  He reports labs with his primary care provider Dr. Wonda Olds in December and the lipid profile was good.  I do not have those results for review at this time.  Defibrillator interrogation shows normal device function.  Estimated generator longevity 6.3 years.  He virtually never requires ventricular pacing.  He has not had any episodes of sustained or  nonsustained VT since his last download.  His device demonstrated evidence of atrial fibrillation about 6 months ago, none since August.  The overall burden of atrial fibrillation is very low, less than 0.1%.  OptiVol suggest normal volume status.  Baseline ECG (from November) shows sinus bradycardia 52 bpm with an incomplete left bundle branch block.  QTc is normal at 407 ms  Most recent LVEF 25-30% by echo in July 2022.  Extensive scar but no ischemia on nuclear stress test in July 2022.  He is on high-dose carvedilol.  He is on moderate dose Entresto but is still taking some amlodipine.  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   Arthritis    knees   Chronic systolic HF (heart failure) (HCC)    Coronary artery disease    Diabetes mellitus with coincident hypertension ([place]) 09/09/2016   History of kidney stones    HTN (hypertension)    Hyperlipidemia    Ischemic cardiomyopathy    MI (myocardial infarction) ([place]) 1998   2018,   Mural thrombus of heart    on coumadin until follow up echo with contrast in Sept 13, 2012 which showed no thrombus and coumadin was d/c   Pre-diabetes     Past Surgical History:  Procedure Laterality Date   CORONARY ANGIOPLASTY  06/10/1996   PCI   CORONARY STENT INTERVENTION N/A 09/05/2016   Procedure: Coronary Stent Intervention;  Surgeon: Burnell Blanks, MD;  Location: [facility];  Service: Cardiovascular;  Laterality: N/A;  HERNIA REPAIR  2004   ICD IMPLANT N/A 02/12/2017   Procedure: ICD Implant;  Surgeon: Sanda Klein, MD;  Location: [facility];  Service: Cardiovascular;  Laterality: N/A;   INTRAVASCULAR ULTRASOUND/IVUS N/A 09/05/2016   Procedure: Intravascular Ultrasound/IVUS;  Surgeon: Burnell Blanks, MD;  Location: [facility];  Service: Cardiovascular;  Laterality: N/A;   LEFT HEART CATH AND CORONARY ANGIOGRAPHY N/A 09/05/2016   Procedure:  Left Heart Cath and Coronary Angiography;  Surgeon: Burnell Blanks, MD;  Location: [facility];  Service: Cardiovascular;  Laterality: N/A;   TOTAL KNEE ARTHROPLASTY Left 01/29/2021   Procedure: TOTAL KNEE ARTHROPLASTY;  Surgeon: Gaynelle Arabian, MD;  Location: [facility];  Service: Orthopedics;  Laterality: Left;    Current Medications: Current Meds  Medication Sig   acetaminophen (TYLENOL) 325 MG tablet Take 325 mg by mouth every 4 (four) hours as needed for mild pain or moderate pain.   apixaban (ELIQUIS) 5 MG TABS tablet Take 1 tablet (5 mg total) by mouth 2 (two) times daily.   ascorbic acid (VITAMIN C) 1000 MG tablet Take 1,000 mg by mouth daily.   atorvastatin (LIPITOR) 80 MG tablet Take 1 tablet by mouth once daily   carvedilol (COREG) 25 MG tablet TAKE 1 TABLET BY MOUTH TWICE DAILY WITH A MEAL   Cholecalciferol 25 MCG (1000 UT) tablet Take 1,000 Units by mouth daily.   Multiple Vitamins-Minerals (MENS 50+ MULTIVITAMIN PO) Take 1 capsule by mouth daily in the afternoon.   sacubitril-valsartan (ENTRESTO) 97-103 MG Take 1 tablet by mouth 2 (two) times daily.   [DISCONTINUED] amLODipine (NORVASC) 5 MG tablet Take 1 tablet by mouth once daily     Allergies:   Patient has no known allergies.   Social History   Socioeconomic History   Marital status: Married    Spouse name: Not on file   Number of children: Not on file   Years of education: Not on file   Highest education level: Not on file  Occupational History   Not on file  Tobacco Use   Smoking status: Former    Packs/day: 1.00    Years: 20.00    Total pack years: 20.00    Types: Cigarettes    Quit date: 02/12/1993    Years since quitting: 29.5   Smokeless tobacco: Never  Vaping Use   Vaping Use: Never used  Substance and Sexual Activity   Alcohol use: No   Drug use: No   Sexual activity: Not on file  Other Topics Concern   Not on file  Social History Narrative   Not on file   Social Determinants of  Health   Financial Resource Strain: Not on file  Food Insecurity: Not on file  Transportation Needs: Not on file  Physical Activity: Not on file  Stress: Not on file  Social Connections: Not on file     Family History: The patient's family history includes Emphysema in his father. ROS:   Please see the history of present illness.    All other systems are reviewed and are negative.  EKGs/Labs/Other Studies Reviewed:    The following studies were reviewed today: Echocardiogram 12/29/2020: d apical akinesis consistent with  previous anterior MI      No mural apical thrombus by definity      Previous echo not online for comparision But described as EF 30-35% .  Left ventricular ejection fraction, by estimation, is 25 to 30%. The left  ventricle has severely decreased  function. The left ventricle has no  regional wall motion abnormalities.  The left ventricular internal cavity size was severely dilated. Left  ventricular diastolic parameters were normal.   2. Device catheter in RA/RV. Right ventricular systolic function is  normal. The right ventricular size is normal.   3. Left atrial size was moderately dilated.   4. Right atrial size was mildly dilated.   5. The mitral valve is abnormal. Mild mitral valve regurgitation. No  evidence of mitral stenosis.   6. The aortic valve is tricuspid. There is mild calcification of the  aortic valve. Aortic valve regurgitation is trivial. Mild aortic valve  sclerosis is present, with no evidence of aortic valve stenosis.   7. The inferior vena cava is normal in size with greater than 50%  respiratory variability, suggesting right atrial pressure of 3 mmHg.   Nuclear stress test 01/03/2021:  The left ventricular ejection fraction is severely decreased (<30%). Nuclear stress EF: 26%. There was no ST segment deviation noted during stress. There is a large, severe, non-reversible defect in all anterior, inferior, anteroseptal, apical LV  segments and apex. There is relative sparing of the lateral LV wall (except apical portion) and mid-inferoseptal LV wall. 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 This is a high risk study.  Comprehensive check of his ICD today.  3 episodes of brief paroxysmal atrial fibrillation with moderate RVR are seen, lasting 1-2 hours each.  EKG: Is ordered today and personally reviewed.  It shows sinus bradycardia 52 bpm, left ventricular hypertrophy/incomplete left bundle branch block with sequela of old anterolateral infarction, left axis deviation, ST segment depression and T wave inversion leads I and aVL.  QTc is normal at 407 ms.  Recent Labs: No results found for requested labs within last 365 days.   06/21/2021 Creat 1.08, K 4.3, Hgb 14.3,  normal LFTs  12/20/2021 Potassium 4.4, glucose 128, creatinine 1.2, normal liver function tests, normal iron studies, TSH 4.69, hemoglobin 14.1, platelets 160 K Hemoglobin A1c 6.6%  Recent Lipid Panel    Component Value Date/Time   CHOL 119 07/22/2018 1104   TRIG 92 01/26/2020 2028   HDL 40 07/22/2018 1104   CHOLHDL 3.0 07/22/2018 1104   CHOLHDL 2.8 09/16/2016 0949   VLDL 22 09/16/2016 0949   LDLCALC 55 07/22/2018 1104   06/21/2021 Total cholesterol 119, triglycerides 133, HDL 45, LDL 60, A1c   12/20/2021 Direct LDL 64, cholesterol 127, HDL 49, triglycerides 86 Physical Exam:    VS:  BP 132/74 (BP Location: Left Arm, Patient Position: Sitting, Cuff Size: Large)   Pulse (!) 55   Ht '6\' 4"'$  (1.93 m)   Wt 263 lb (119.3 kg)   SpO2 92%   BMI 32.01 kg/m     Wt Readings from Last 3 Encounters:  08/05/22 263 lb (119.3 kg)  04/15/22 262 lb 12.8 oz (119.2 kg)  07/16/21 266 lb 6.4 oz (120.8 kg)     General: Alert, oriented x3, no distress, healthy left subclavian defibrillator site Head: no evidence of trauma, PERRL, EOMI, no exophtalmos or lid lag, no myxedema, no xanthelasma; normal ears, nose and oropharynx Neck:  normal jugular venous pulsations and no hepatojugular reflux; brisk carotid pulses without delay and no carotid bruits Chest: clear to auscultation, no signs of consolidation by percussion or palpation, normal fremitus, symmetrical and full respiratory excursions Cardiovascular: normal position and quality of the apical impulse, regular rhythm, normal first and second heart sounds, no murmurs, rubs or gallops Abdomen: no tenderness or  distention, no masses by palpation, no abnormal pulsatility or arterial bruits, normal bowel sounds, no hepatosplenomegaly Extremities: no clubbing, cyanosis or edema; 2+ radial, ulnar and brachial pulses bilaterally; 2+ right femoral, posterior tibial and dorsalis pedis pulses; 2+ left femoral, posterior tibial and dorsalis pedis pulses; no subclavian or femoral bruits Neurological: grossly nonfocal Psych: Normal mood and affect    ASSESSMENT:    1. Paroxysmal atrial fibrillation (HCC)   2. Ventricular flutter ([place])   3. Chronic systolic heart failure ([place])   4. Coronary artery disease involving native coronary artery of native heart without angina pectoris   5. Essential hypertension   6. Diabetes mellitus type 2 in obese ([place])   7. Hypercholesterolemia   8. ICD (implantable cardioverter-defibrillator) in place   9. Class 1 obesity due to excess calories with serious comorbidity and body mass index (BMI) of 31.0 to 31.9 in adult         PLAN:    In order of problems listed above:  Paroxysmal atrial fibrillation: Detected by his device but no recurrence since August 2023.  CHA2DS2-VASc 6 (age 61, CHF, CAD, DM, HTN) new diagnosis.  Asymptomatic.  Ventricular rate control is fair if not perfect.  Overall burden of arrhythmia is quite low and antiarrhythmics are not justified.  On Eliquis.   Ventricular flutter:     A single event occurred in June 2022 and was immediately and appropriately treated by his defibrillator.  No recurrence since then.  The  trigger was never identified.  We have intensified his heart failure therapy since then. CHF:  appears euvolemic clinically and based on his OptiVol, NYHA functional class I-2.  Will increase the Entresto to maximum dose and stop the amlodipine.  He is on a good dose of carvedilol.  Ne step would be to add spironolactone and SGLT2 inhibitor, but he is very concerned about the cost.  We are trying to clarify the financial support for his Entresto.  We changed the dose of Entresto via a voice call with the manufacturer supported pharmacy today. CAD: Nuclear perfusion study in 07/30/2020 showed extensive scar but no ischemia and he does not have angina pectoris.   S/p anterior MI 1998, s/p LAD-DES 09/05/2016.  On beta-blocker.  Aspirin stopped restarted for anticoagulation.  On statin. HTN: Will maximize the dose of Entresto today and stop his amlodipine.  Start spironolactone at next appointment. DM: Excellent Control, A1c 6.56% HLP: On him.  Labs monitored by PCP.  Target LDL less than 70. ICD: Normal device function.  Continue remote downloads every 3 months. Obese: Should keep trying to lose weight.    Medication Adjustments/Labs and Tests Ordered: Current medicines are reviewed at length with the patient today.  Concerns regarding medicines are outlined above.  No orders of the defined types were placed in this encounter.  Patient Instructions  Medication Instructions:  Stop taking the Amlodipine Entresto increased to 97/103 twice a day *If you need a refill on your cardiac medications before your next appointment, please call your pharmacy*  At [facility], you and your health needs are our priority.  As part of our continuing mission to provide you with exceptional heart care, we have created designated Provider Care Teams.  These Care Teams include your primary Cardiologist (physician) and Advanced Practice Providers (APPs -  Physician Assistants and Nurse Practitioners) who all  work together to provide you with the care you need, when you need it.  We recommend signing up for the patient portal  called "MyChart".  Sign up information is provided on this After Visit Summary.  MyChart is used to connect with patients for Virtual Visits (Telemedicine).  Patients are able to view lab/test results, encounter notes, upcoming appointments, etc.  Non-urgent messages can be sent to your provider as well.   To learn more about what you can do with MyChart, go to [email or web address].    Your next appointment:   3 month(s)  Provider:   Sanda Klein, MD     Call us if you are about to run out of your [facility] before you are "approved" for assistance and we can send in a different medication= Valsartan to bridge between the two medications.     Signed, Sanda Klein, MD  08/08/2022 6:28 PM    [place]

## 2022-08-05 NOTE — Patient Instructions (Addendum)
Medication Instructions:  Stop taking the Amlodipine Entresto increased to 97/103 twice a day *If you need a refill on your cardiac medications before your next appointment, please call your pharmacy*  At [facility], you and your health needs are our priority.  As part of our continuing mission to provide you with exceptional heart care, we have created designated Provider Care Teams.  These Care Teams include your primary Cardiologist (physician) and Advanced Practice Providers (APPs -  Physician Assistants and Nurse Practitioners) who all work together to provide you with the care you need, when you need it.  We recommend signing up for the patient portal called "MyChart".  Sign up information is provided on this After Visit Summary.  MyChart is used to connect with patients for Virtual Visits (Telemedicine).  Patients are able to view lab/test results, encounter notes, upcoming appointments, etc.  Non-urgent messages can be sent to your provider as well.   To learn more about what you can do with MyChart, go to [email or web address].    Your next appointment:   3 month(s)  Provider:   Sanda Klein, MD     Call us if you are about to run out of your [facility] before you are "approved" for assistance and we can send in a different medication= Valsartan to bridge between the two medications.

## 2022-08-26 ENCOUNTER — Other Ambulatory Visit: Payer: Self-pay | Admitting: Cardiovascular Disease

## 2022-08-28 ENCOUNTER — Other Ambulatory Visit: Payer: Self-pay

## 2022-08-28 NOTE — Progress Notes (Signed)
Remote ICD transmission.   

## 2022-08-29 ENCOUNTER — Telehealth: Payer: Self-pay | Admitting: Cardiovascular Disease

## 2022-08-29 NOTE — Telephone Encounter (Signed)
*  STAT* If patient is at the pharmacy, call can be transferred to refill team.   1. Which medications need to be refilled? (please list name of each medication and dose if known) carvedilol (COREG) 25 MG tablet   atorvastatin (LIPITOR) 80 MG tablet    2. Which pharmacy/location (including street and city if local pharmacy) is medication to be sent to?  [place] [place]    3. Do they need a 30 day or 90 day supply? 90 day

## 2022-08-30 MED ORDER — CARVEDILOL 25 MG PO TABS: 25.0000 mg | ORAL_TABLET | Freq: Two times a day (BID) | ORAL | 3 refills | Status: DC

## 2022-08-30 MED ORDER — ATORVASTATIN CALCIUM 80 MG PO TABS: 80.0000 mg | ORAL_TABLET | Freq: Every day | ORAL | 3 refills | Status: DC

## 2022-08-30 NOTE — Telephone Encounter (Signed)
Refill for Carvedilol and Atorvastatin been sent to [facility].

## 2022-10-23 ENCOUNTER — Emergency Department (HOSPITAL_COMMUNITY): Payer: Medicare Other

## 2022-10-23 ENCOUNTER — Encounter (HOSPITAL_COMMUNITY): Payer: Self-pay

## 2022-10-23 ENCOUNTER — Emergency Department (HOSPITAL_COMMUNITY)
Admission: EM | Admit: 2022-10-23 | Discharge: 2022-10-23 | Disposition: A | Discharge status: Home / Self Care | Payer: Medicare Other | Attending: Emergency Medicine | Admitting: Emergency Medicine

## 2022-10-23 ENCOUNTER — Other Ambulatory Visit: Payer: Self-pay

## 2022-10-23 DIAGNOSIS — Z7901 Long term (current) use of anticoagulants: Secondary | ICD-10-CM | POA: Insufficient documentation

## 2022-10-23 DIAGNOSIS — E119 Type 2 diabetes mellitus without complications: Secondary | ICD-10-CM | POA: Diagnosis not present

## 2022-10-23 DIAGNOSIS — E039 Hypothyroidism, unspecified: Secondary | ICD-10-CM | POA: Insufficient documentation

## 2022-10-23 DIAGNOSIS — Z7984 Long term (current) use of oral hypoglycemic drugs: Secondary | ICD-10-CM | POA: Insufficient documentation

## 2022-10-23 DIAGNOSIS — I110 Hypertensive heart disease with heart failure: Secondary | ICD-10-CM | POA: Insufficient documentation

## 2022-10-23 DIAGNOSIS — I5023 Acute on chronic systolic (congestive) heart failure: Secondary | ICD-10-CM | POA: Diagnosis not present

## 2022-10-23 DIAGNOSIS — Z79899 Other long term (current) drug therapy: Secondary | ICD-10-CM | POA: Diagnosis not present

## 2022-10-23 DIAGNOSIS — R079 Chest pain, unspecified: Secondary | ICD-10-CM | POA: Diagnosis present

## 2022-10-23 LAB — COMPREHENSIVE METABOLIC PANEL
ALT: 16 U/L (ref 0–44)
AST: 20 U/L (ref 15–41)
Albumin: 3.8 g/dL (ref 3.5–5.0)
Alkaline Phosphatase: 57 U/L (ref 38–126)
Anion gap: 11 (ref 5–15)
BUN: 12 mg/dL (ref 8–23)
CO2: 24 mmol/L (ref 22–32)
Calcium: 9 mg/dL (ref 8.9–10.3)
Chloride: 107 mmol/L (ref 98–111)
Creatinine, Ser: 1.17 mg/dL (ref 0.61–1.24)
GFR, Estimated: >60 mL/min (ref >60)
Glucose, Bld: 152 mg/dL — ABNORMAL HIGH (ref 70–99)
Potassium: 3.5 mmol/L (ref 3.5–5.1)
Sodium: 142 mmol/L (ref 135–145)
Total Bilirubin: 1.4 mg/dL — ABNORMAL HIGH (ref 0.3–1.2)
Total Protein: 6.2 g/dL — ABNORMAL LOW (ref 6.5–8.1)

## 2022-10-23 LAB — CBC
HCT: 41.5 % (ref 39.0–52.0)
Hemoglobin: 13.8 g/dL (ref 13.0–17.0)
MCH: 33.3 pg (ref 26.0–34.0)
MCHC: 33.3 g/dL (ref 30.0–36.0)
MCV: 100.2 fL — ABNORMAL HIGH (ref 80.0–100.0)
Platelets: 164 10*3/uL (ref 150–400)
RBC: 4.14 MIL/uL — ABNORMAL LOW (ref 4.22–5.81)
RDW: 12.8 % (ref 11.5–15.5)
WBC: 8.3 10*3/uL (ref 4.0–10.5)
nRBC: 0 % (ref 0.0–0.2)

## 2022-10-23 LAB — TROPONIN I (HIGH SENSITIVITY)
Troponin I (High Sensitivity): 24 ng/L — ABNORMAL HIGH (ref <18)
Troponin I (High Sensitivity): 24 ng/L — ABNORMAL HIGH (ref <18)

## 2022-10-23 MED ORDER — FUROSEMIDE 20 MG PO TABS: 20.0000 mg | ORAL_TABLET | Freq: Every day | ORAL | 0 refills | Status: DC

## 2022-10-23 MED ORDER — FUROSEMIDE 10 MG/ML IJ SOLN
40.0000 mg | Freq: Once | INTRAMUSCULAR | Status: AC
  Administered 2022-10-23: 40 mg via INTRAVENOUS
  Filled 2022-10-23: qty 4

## 2022-10-23 MED ORDER — IOHEXOL 350 MG/ML SOLN
75.0000 mL | Freq: Once | INTRAVENOUS | Status: AC | PRN
  Administered 2022-10-23: 75 mL via INTRAVENOUS

## 2022-10-23 NOTE — ED Provider Notes (Signed)
[place] EMERGENCY DEPARTMENT AT [facility] Provider Note   CSN: [id number] Arrival date & time: 10/23/22  1032     History  Chief Complaint  Patient presents with   Shortness of Breath   Back Pain    Kevin Patel is a 77 y.o. male with HTN, ICM's with NSTEMI/unstable angina, history of ventricular flutter s/p ICD, T2DM, HLD, hypothyroidism, HFrEF (EF 25-30%), presents with CP/SOB.   Patient presents with several weeks of dyspnea and pleuritic anterior right-sided chest pain.  States it is not going away and that is what brought him in today.  Also today he experienced a sharp pain in his left middle back that he has never had before. +orthopnea, DOE. Has been coughing with chest congestion, coughing up clear phlegm. No significant increase in LEE. Takes entresto for HF but no diuretic at home.   Per cards note from 07/2022: Defibrillator interrogation shows normal device function.  Estimated generator longevity 6.3 years.  He virtually never requires ventricular pacing.  He has not had any episodes of sustained or nonsustained VT since his last download.  His device demonstrated evidence of atrial fibrillation about 6 months ago, none since August.  The overall burden of atrial fibrillation is very low, less than 0.1%.  OptiVol suggest normal volume status.  Most recent LVEF 25-30% by echo in July 2022. Extensive scar but no ischemia on nuclear stress test in July 2022. He is on high-dose carvedilol. He is on moderate dose Entresto but is still taking some amlodipine.   HPI     Home Medications Prior to Admission medications   Medication Sig Start Date End Date Taking? Authorizing Provider  acetaminophen (TYLENOL) 325 MG tablet Take 325 mg by mouth every 4 (four) hours as needed for mild pain or moderate pain. 09/09/16   [provider]  apixaban (ELIQUIS) 5 MG TABS tablet Take 1 tablet (5 mg total) by mouth 2 (two) times daily. 04/15/22   Croitoru, Mihai, MD   ascorbic acid (VITAMIN C) 1000 MG tablet Take 1,000 mg by mouth daily.    [provider]  atorvastatin (LIPITOR) 80 MG tablet Take 1 tablet (80 mg total) by mouth daily. 08/30/22   Croitoru, Mihai, MD  carvedilol (COREG) 25 MG tablet Take 1 tablet (25 mg total) by mouth 2 (two) times daily with a meal. 08/30/22   Croitoru, Mihai, MD  Cholecalciferol 25 MCG (1000 UT) tablet Take 1,000 Units by mouth daily.    [provider]  empagliflozin (JARDIANCE) 10 MG TABS tablet Take 1 tablet (10 mg total) by mouth daily before breakfast. 10/28/22   Croitoru, Rachelle Hora, MD  furosemide (LASIX) 20 MG tablet Take 1 tablet (20 mg total) by mouth daily as needed for up to 3 days for fluid or edema (Take 20 mg= 1 tablet daily as needed for weight gain of 3 pounds in a day or 5 pounds in a week). 10/28/22 10/31/22  Croitoru, Mihai, MD  Multiple Vitamins-Minerals (MENS 50+ MULTIVITAMIN PO) Take 1 capsule by mouth daily in the afternoon.    [provider]  nitroGLYCERIN (NITROSTAT) 0.4 MG SL tablet Place 0.4 mg under the tongue every 5 (five) minutes as needed for chest pain.  Patient not taking: Reported on 10/28/2022 11/24/18   [provider]  sacubitril-valsartan (ENTRESTO) 97-103 MG Take 1 tablet by mouth 2 (two) times daily. 08/05/22   Croitoru, Rachelle Hora, MD      Allergies    Patient has no known allergies.  Review of Systems   Review of Systems Review of systems Negative for f/c.  A 10 point review of systems was performed and is negative unless otherwise reported in HPI.  Physical Exam Updated Vital Signs BP (!) 139/91   Pulse 74   Temp 97.6 F (36.4 C)   Resp (!) 21   Ht 6\' 4"  (1.93 m)   Wt 117.9 kg   SpO2 92%   BMI 31.65 kg/m  Physical Exam General: Normal appearing male, lying in bed.  HEENT: PERRLA, Sclera anicteric, MMM, trachea midline.  Cardiology: RRR, no murmurs/rubs/gallops. BL radial and DP pulses equal bilaterally.  No abnormalities to anterior chest, no  chest wall tenderness palpation. Resp: Normal respiratory rate and effort.  Bibasilar crackles. Abd: Soft, non-tender, non-distended. No rebound tenderness or guarding.  GU: Deferred. MSK: No peripheral edema or signs of trauma. Extremities without deformity or TTP. No cyanosis or clubbing. Skin: warm, dry. Neuro: A&Ox4, CNs II-XII grossly intact. MAEs. Sensation grossly intact.  Psych: Normal mood and affect.   ED Results / Procedures / Treatments   Labs (all labs ordered are listed, but only abnormal results are displayed) Labs Reviewed  CBC - Abnormal; Notable for the following components:      Result Value   RBC 4.14 (*)    MCV 100.2 (*)    All other components within normal limits  COMPREHENSIVE METABOLIC PANEL - Abnormal; Notable for the following components:   Glucose, Bld 152 (*)    Total Protein 6.2 (*)    Total Bilirubin 1.4 (*)    All other components within normal limits  TROPONIN I (HIGH SENSITIVITY) - Abnormal; Notable for the following components:   Troponin I (High Sensitivity) 24 (*)    All other components within normal limits  TROPONIN I (HIGH SENSITIVITY) - Abnormal; Notable for the following components:   Troponin I (High Sensitivity) 24 (*)    All other components within normal limits    EKG EKG Interpretation  Date/Time:  Wednesday Oct 23 2022 11:04:11 EDT Ventricular Rate:  73 PR Interval:  164 QRS Duration: 118 QT Interval:  438 QTC Calculation: 483 R Axis:   -15 Text Interpretation: Sinus rhythm Ventricular premature complex Incomplete left bundle branch block LVH with secondary repolarization abnormality Confirmed by Vivi Barrack [phone]) on 10/23/2022 11:06:31 AM  Radiology No results found.  Procedures Procedures    Medications Ordered in ED Medications  iohexol (OMNIPAQUE) 350 MG/ML injection 75 mL (75 mLs Intravenous Contrast Given 10/23/22 1259)  furosemide (LASIX) injection 40 mg (40 mg Intravenous Given 10/23/22 1424)    ED  Course/ Medical Decision Making/ A&P                          Medical Decision Making Amount and/or Complexity of Data Reviewed Labs: ordered. Decision-making details documented in ED Course. Radiology: ordered. Decision-making details documented in ED Course.  Risk Prescription drug management.    This patient presents to the ED for concern of DOE/orthopnea, CP; this involves an extensive number of treatment options, and is a complaint that carries with it a high risk of complications and morbidity.  I considered the following differential and admission for this acute, potentially life threatening condition.   MDM:    DDX for dyspnea includes but is not limited to:  Consider causes of chest pain and shortness of breath including pneumonia especially with patient coughing up clear phlegm and congestion, heart failure exacerbation with pulmonary edema/pleural  effusion given patient's history of patient's known severe HFrEF and report of chest congestion with orthopnea/DOE, ACS/arrhythmia given patient's extensive cardiac history, pulmonary embolus given anterior pleuritic chest pain and shortness of breath, though no signs of DVT, or aortic dissection given report of chest pain radiating to the back though he has equal pulses in all extremities, or pneumothorax though he has equal breath sounds bilaterally.  Lower concern for valvular heart disease to severe aortic stenosis as no murmur is heard on exam.  Also consider non-life-threatening causes such as upper respiratory viral process, bronchitis, pleurisy.   EKG without any signs of ischemia or acute arrhythmia.    Clinical Course as of 11/08/22 0732  Wed Oct 23, 2022  1211 Troponin I (High Sensitivity)(!): 24 [HN]  1211 WBC: 8.3 [HN]  1211 Hemoglobin: 13.8 [HN]  1211 Comprehensive metabolic panel(!) Unremarkable in the context of this patient's presentation  [HN]  1212 DG Chest Port 1 View FINDINGS: Left-sided implanted cardiac  device remains in place. Cardiomegaly. Pulmonary vascular congestion. Diffuse bilateral interstitial opacities. Band-like atelectasis at the left lung base. No pleural effusion or pneumothorax.  IMPRESSION: Cardiomegaly with pulmonary vascular congestion and interstitial edema.   [HN]  1339 CT Angio Chest PE W and/or Wo Contrast 1. No evidence of pulmonary embolism. 2. Findings of congestive heart failure with pulmonary edema and small layering bilateral pleural effusions. 3. Mild superior endplate compression fracture of T12 with associated Schmorl's node, presumably chronic, although technically age indeterminate. 4. Rounded 1.8 cm lesion arising from the upper pole of the right kidney measuring slightly greater than simple fluid density. This may represent a hemorrhagic or proteinaceous cyst. Further evaluation with nonemergent renal ultrasound is recommended to exclude the possibility of a solid mass. 5. Aortic and coronary artery atherosclerosis (ICD10-I70.0).   [HN]  1339 Patient will be given lasix 40 mg IV and awaiting repeat trop. Will check pulse ox while ambulating. Pt doesn't take lasix at home. Will give a few days of lasix PO if stable for DC and instruct to f/u with cardiologist.  [HN]    Clinical Course User Index [HN] Loetta Rough, MD    Labs: I Ordered, and personally interpreted labs.  The pertinent results include: Those listed above  Imaging Studies ordered: I ordered imaging studies including CXR, CTPE I independently visualized and interpreted imaging. I agree with the radiologist interpretation  Additional history obtained from chart review.    Cardiac Monitoring: The patient was maintained on a cardiac monitor.  I personally viewed and interpreted the cardiac monitored which showed an underlying rhythm of: NSR  Reevaluation: After the interventions noted above, I reevaluated the patient and found that they have :improved  Social Determinants  of Health: Patient lives independently   Disposition:  Patient with likely heart failure exacerbation.  Is signed out to the oncoming ED physician Dr. Rubin Payor who is made aware of his history, presentation, exam, workup, and plan.  Plan is to await repeat troponin and assess response to 40 mg IV Lasix, pulse ox when ambulating.  Will DC with a few days of Lasix if stable for discharge and instructed follow-up with cardiologist.   Co morbidities that complicate the patient evaluation  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   Arthritis    knees   Chronic systolic HF (heart failure) (HCC)    Coronary artery disease    Diabetes mellitus with coincident hypertension (HCC) 09/09/2016   History of kidney stones  HTN (hypertension)    Hyperlipidemia    Ischemic cardiomyopathy    MI (myocardial infarction) (HCC) 1998   2018,   Mural thrombus of heart    on coumadin until follow up echo with contrast in Sept 13, 2012 which showed no thrombus and coumadin was d/c   Pre-diabetes      Medicines Meds ordered this encounter  Medications   iohexol (OMNIPAQUE) 350 MG/ML injection 75 mL   furosemide (LASIX) injection 40 mg   DISCONTD: furosemide (LASIX) 20 MG tablet    Sig: Take 1 tablet (20 mg total) by mouth daily for 3 days.    Dispense:  3 tablet    Refill:  0    I have reviewed the patients home medicines and have made adjustments as needed  Problem List / ED Course: Problem List Items Addressed This Visit   None Visit Diagnoses     Acute on chronic systolic congestive heart failure (HCC)    -  Primary   Relevant Medications   furosemide (LASIX) injection 40 mg (Completed)                   This note was created using dictation software, which may contain spelling or grammatical errors.    Loetta Rough, MD 11/08/22 [phone]

## 2022-10-23 NOTE — ED Provider Notes (Signed)
  Physical Exam  BP (!) 139/91   Pulse 74   Temp 97.6 F (36.4 C)   Resp (!) 21   Ht 6\' 4"  (1.93 m)   Wt 117.9 kg   SpO2 92%   BMI 31.65 kg/m   Physical Exam  Procedures  Procedures  ED Course / MDM   Clinical Course as of 10/23/22 2248  Wed Oct 23, 2022  1211 Troponin I (High Sensitivity)(!): 24 [HN]  1211 WBC: 8.3 [HN]  1211 Hemoglobin: 13.8 [HN]  1211 Comprehensive metabolic panel(!) Unremarkable in the context of this patient's presentation  [HN]  1212 DG Chest Port 1 View FINDINGS: Left-sided implanted cardiac device remains in place. Cardiomegaly. Pulmonary vascular congestion. Diffuse bilateral interstitial opacities. Band-like atelectasis at the left lung base. No pleural effusion or pneumothorax.  IMPRESSION: Cardiomegaly with pulmonary vascular congestion and interstitial edema.   [HN]  1339 CT Angio Chest PE W and/or Wo Contrast 1. No evidence of pulmonary embolism. 2. Findings of congestive heart failure with pulmonary edema and small layering bilateral pleural effusions. 3. Mild superior endplate compression fracture of T12 with associated Schmorl's node, presumably chronic, although technically age indeterminate. 4. Rounded 1.8 cm lesion arising from the upper pole of the right kidney measuring slightly greater than simple fluid density. This may represent a hemorrhagic or proteinaceous cyst. Further evaluation with nonemergent renal ultrasound is recommended to exclude the possibility of a solid mass. 5. Aortic and coronary artery atherosclerosis (ICD10-I70.0).   [HN]  1339 Patient will be given lasix 40 mg IV and awaiting repeat trop. Will check pulse ox while ambulating. Pt doesn't take lasix at home. Will give a few days of lasix PO if stable for DC and instruct to f/u with cardiologist.  [HN]    Clinical Course User Index [HN] Loetta Rough, MD   Medical Decision Making Amount and/or Complexity of Data Reviewed Labs: ordered.  Decision-making details documented in ED Course. Radiology: ordered. Decision-making details documented in ED Course.  Risk Prescription drug management.   Patient is shortness of breath.  History of CHF.  Appears to be volume overloaded.  Troponin stable.  Eager to go home.  Will discharge.      Benjiman Core, MD 10/23/22 2248

## 2022-10-23 NOTE — ED Triage Notes (Signed)
Pt arrived POV for c/o SOB and left shoulder pain starting at 0600 this morning, dyspneic in triage. Hx CHF, afib, did not take meds this morning. Takes eliquis. A&Ox4.

## 2022-10-23 NOTE — ED Notes (Signed)
Pt O2 stat were 95% while ambulating. Pt stated that it feel so much better to walk around than lying in the bed.

## 2022-10-23 NOTE — Discharge Instructions (Addendum)
Thank you for coming to [facility] Emergency Department. You were seen for shortness of breath. We did an exam, labs, and imaging, and these showed fluid in your lungs that demonstrates a heart failure exacerbation.  We gave you Lasix here in the emergency department we will discharge you with 20 mg of Lasix to take once per day for 3 days.  Please call your cardiologist to make a follow-up appointment for the next 1 to 2 weeks.   Do not hesitate to return to the ED or call 911 if you experience: -Worsening symptoms -Chest pain, shortness of breath -Lightheadedness, passing out -Fevers/chills -Anything else that concerns you

## 2022-10-28 ENCOUNTER — Encounter: Payer: Self-pay | Admitting: Cardiovascular Disease

## 2022-10-28 ENCOUNTER — Ambulatory Visit: Payer: Medicare Other | Attending: Cardiovascular Disease | Admitting: Cardiovascular Disease

## 2022-10-28 VITALS — BP 110/74 | HR 58 | Ht 76.0 in | Wt 262.2 lb

## 2022-10-28 DIAGNOSIS — I5043 Acute on chronic combined systolic (congestive) and diastolic (congestive) heart failure: Secondary | ICD-10-CM | POA: Diagnosis present

## 2022-10-28 DIAGNOSIS — I10 Essential (primary) hypertension: Secondary | ICD-10-CM | POA: Diagnosis present

## 2022-10-28 DIAGNOSIS — R079 Chest pain, unspecified: Secondary | ICD-10-CM | POA: Diagnosis present

## 2022-10-28 DIAGNOSIS — I25118 Atherosclerotic heart disease of native coronary artery with other forms of angina pectoris: Secondary | ICD-10-CM

## 2022-10-28 DIAGNOSIS — E669 Obesity, unspecified: Secondary | ICD-10-CM | POA: Insufficient documentation

## 2022-10-28 DIAGNOSIS — I480 Paroxysmal atrial fibrillation: Secondary | ICD-10-CM | POA: Insufficient documentation

## 2022-10-28 DIAGNOSIS — E1169 Type 2 diabetes mellitus with other specified complication: Secondary | ICD-10-CM

## 2022-10-28 DIAGNOSIS — I4902 Ventricular flutter: Secondary | ICD-10-CM | POA: Insufficient documentation

## 2022-10-28 MED ORDER — FUROSEMIDE 20 MG PO TABS: 20.0000 mg | ORAL_TABLET | Freq: Every day | ORAL | 3 refills | Status: DC | PRN

## 2022-10-28 MED ORDER — EMPAGLIFLOZIN 10 MG PO TABS: 10.0000 mg | ORAL_TABLET | Freq: Every day | ORAL | 3 refills | Status: DC

## 2022-10-28 NOTE — Progress Notes (Signed)
Cardiology Office Note:    Date:  10/30/2022   ID:  Kevin, Patel 1945-07-21, MRN [id number]  PCP:  [name]., MD  Cardiologist:  [name], MD   Referring MD: [name]., MD   Chief Complaint  Patient presents with   Chest Pain   Shortness of Breath    History of Present Illness:    Kevin Patel is a 77 y.o. 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 Previously reported LV apical thrombus has not been seen on his recent echos since 2012 or his recent LV angiogram. He has a single lead Medtronic VISIA AF device, MRI conditional.  The lead is a Medtronic [id number].    He was generally doing well with NYHA functional class I-2 symptoms and no noticeable edema (has not been monitoring weight), but woke up abruptly at 3:00 in the morning on 10/23/2022 with shortness of breath and chest discomfort.  The chest tightness was like a band across his anterior chest.    He was seen in the emergency room and had findings consistent with acute heart failure and improved rapidly after dose of furosemide 40 mg.  His cardiac enzymes were normal.  His ECG was unchanged from baseline, but is of limited diagnostic value since he has an incomplete left bundle branch block with a QRS duration of 118 ms, almost a complete LBBB the CT angiogram of the chest did not show evidence of pulmonary embolism but did show pulmonary edema and small bilateral pleural effusions.  (Incidentally noted was an mild superior endplate compression fraction of T12 with associated Schmorl's node, age indeterminate and a rounded 1.8 cm lesion in the upper pole of the right kidney with fluid density slightly higher than expected  for simple cyst, further evaluation with ultrasound recommended).  Expected aortic.  And coronary artery atherosclerosis was found.   He was not admitted to the hospital.  He was only prescribed 3 more days of oral furosemide and slowly improved.  The chest pain is gone but he still has mild shortness of breath.  He can sleep supine in bed without difficulty.  Interrogation of his defibrillator shows evidence of fluid accumulation (thoracic impedance declined) that began 2 or 3 weeks earlier).  After the administration of diuretics his OptiVol is almost back to baseline.  Otherwise the device interrogation shows normal findings.  He does not require pacing.  Historically his burden of atrial fibrillation has been 0.1%, but none has been detected recently.  He has not had any recent episodes of ventricular tachycardia.  Lead parameters and generator longevity are in expected range.  He has not had any palpitations or syncope and his device has not reported ventricular tachycardia or delivered any therapy.  There is also no evidence of atrial fibrillation in the last several weeks.  He is on chronic anticoagulation with Eliquis for paroxysmal atrial fibrillation and has not had any falls or bleeding events or neurological complaints suggestive of embolic phenomena.  He is on chronic statin therapy with lipid profile followed by his primary care provider.  Baseline ECG (from November) shows sinus bradycardia 52 bpm with an incomplete left bundle branch block.  QTc is normal at 407 ms  Most recent LVEF 25-30% by echo in July 2022.  Extensive scar but no ischemia on nuclear stress test in July 2022.  He is on high-dose carvedilol.  He is on maximum dose Entresto and maximum dose carvedilol.  He is not on an SGLT2 inhibitor or aldosterone antagonist yet.  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   Arthritis    knees   Chronic systolic HF  (heart failure) (HCC)    Coronary artery disease    Diabetes mellitus with coincident hypertension (HCC) 09/09/2016   History of kidney stones    HTN (hypertension)    Hyperlipidemia    Ischemic cardiomyopathy    MI (myocardial infarction) (HCC) 1998   2018,   Mural thrombus of heart    on coumadin until follow up echo with contrast in Sept 13, 2012 which showed no thrombus and coumadin was d/c   Pre-diabetes     Past Surgical History:  Procedure Laterality Date   CORONARY ANGIOPLASTY  06/10/1996   PCI   CORONARY STENT INTERVENTION N/A 09/05/2016   Procedure: Coronary Stent Intervention;  Surgeon: [name], MD;  Location: [facility] INVASIVE CV [facility];  Service: Cardiovascular;  Laterality: N/A;   CORONARY ULTRASOUND/IVUS N/A 09/05/2016   Procedure: Intravascular Ultrasound/IVUS;  Surgeon: [name], MD;  Location: [facility] INVASIVE CV LAB;  Service: Cardiovascular;  Laterality: N/A;   HERNIA REPAIR  2004   ICD IMPLANT N/A 02/12/2017   Procedure: ICD Implant;  Surgeon: [name], MD;  Location: [facility] INVASIVE CV [facility];  Service: Cardiovascular;  Laterality: N/A;   LEFT HEART CATH AND CORONARY ANGIOGRAPHY N/A 09/05/2016   Procedure: Left Heart Cath and Coronary Angiography;  Surgeon: [name], MD;  Location: [facility] INVASIVE CV LAB;  Service: Cardiovascular;  Laterality: N/A;   TOTAL KNEE ARTHROPLASTY Left 01/29/2021   Procedure: TOTAL KNEE ARTHROPLASTY;  Surgeon: Ollen Gross, MD;  Location: [facility];  Service: Orthopedics;  Laterality: Left;    Current Medications: Current Meds  Medication Sig   acetaminophen (TYLENOL) 325 MG tablet Take 325 mg by mouth every 4 (four) hours as needed for mild pain or moderate pain.   apixaban (ELIQUIS) 5 MG TABS tablet Take 1 tablet (5 mg total) by mouth 2 (two) times daily.   ascorbic acid (VITAMIN C) 1000 MG tablet Take 1,000 mg by mouth daily.   atorvastatin (LIPITOR) 80 MG tablet Take 1 tablet (80 mg total) by mouth daily.    carvedilol (COREG) 25 MG tablet Take 1 tablet (25 mg total) by mouth 2 (two) times daily with a meal.   Cholecalciferol 25 MCG (1000 UT) tablet Take 1,000 Units by mouth daily.   empagliflozin (JARDIANCE) 10 MG TABS tablet Take 1 tablet (10 mg total) by mouth daily before breakfast.   Multiple Vitamins-Minerals (MENS 50+ MULTIVITAMIN PO) Take 1 capsule by mouth daily in the afternoon.   sacubitril-valsartan (ENTRESTO) 97-103 MG Take 1 tablet by mouth 2 (two) times daily.     Allergies:   Patient has no known allergies.   Social History   Socioeconomic History   Marital status: Married    Spouse name: Not on file   Number of children: Not on file   Years of education: Not on file   Highest education level: Not on file  Occupational History   Not on file  Tobacco Use   Smoking status: Former    Packs/day: 1.00    Years: 20.00    Additional pack  years: 0.00    Total pack years: 20.00    Types: Cigarettes    Quit date: 02/12/1993    Years since quitting: 29.7   Smokeless tobacco: Never  Vaping Use   Vaping Use: Never used  Substance and Sexual Activity   Alcohol use: No   Drug use: No   Sexual activity: Not on file  Other Topics Concern   Not on file  Social History Narrative   Not on file   Social Determinants of Health   Financial Resource Strain: Not on file  Food Insecurity: Not on file  Transportation Needs: Not on file  Physical Activity: Not on file  Stress: Not on file  Social Connections: Not on file     Family History: The patient's family history includes Emphysema in his father. ROS:   Please see the history of present illness.    All other systems are reviewed and are negative.  EKGs/Labs/Other Studies Reviewed:    The following studies were reviewed today: Echocardiogram 12/29/2020: d apical akinesis consistent with  previous anterior MI      No mural apical thrombus by definity      Previous echo not online for comparision But described as EF  30-35% .  Left ventricular ejection fraction, by estimation, is 25 to 30%. The left  ventricle has severely decreased function. The left ventricle has no  regional wall motion abnormalities.  The left ventricular internal cavity size was severely dilated. Left  ventricular diastolic parameters were normal.   2. Device catheter in RA/RV. Right ventricular systolic function is  normal. The right ventricular size is normal.   3. Left atrial size was moderately dilated.   4. Right atrial size was mildly dilated.   5. The mitral valve is abnormal. Mild mitral valve regurgitation. No  evidence of mitral stenosis.   6. The aortic valve is tricuspid. There is mild calcification of the  aortic valve. Aortic valve regurgitation is trivial. Mild aortic valve  sclerosis is present, with no evidence of aortic valve stenosis.   7. The inferior vena cava is normal in size with greater than 50%  respiratory variability, suggesting right atrial pressure of 3 mmHg.   Nuclear stress test 01/03/2021:  The left ventricular ejection fraction is severely decreased (<30%). Nuclear stress EF: 26%. There was no ST segment deviation noted during stress. There is a large, severe, non-reversible defect in all anterior, inferior, anteroseptal, apical LV segments and apex. There is relative sparing of the lateral LV wall (except apical portion) and mid-inferoseptal LV wall. 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 This is a high risk study.  Comprehensive check of his ICD today.  3 episodes of brief paroxysmal atrial fibrillation with moderate RVR are seen, lasting 1-2 hours each.  EKG: Is not ordered today, but personally reviewed the tracing from 10/24/2022.  This shows sinus rhythm with PACs, incomplete left bundle branch block QRS 118 ms and sequelae of an old anterolateral infarction, left axis deviation.  Borderline prolonged QTc.  No meaningful changes from previous tracings.  Recent  Labs: 10/23/2022: ALT 16; BUN 12; Creatinine, Ser 1.17; Hemoglobin 13.8; Platelets 164; Potassium 3.5; Sodium 142   06/21/2021 Creat 1.08, K 4.3, Hgb 14.3,  normal LFTs  12/20/2021 Potassium 4.4, glucose 128, creatinine 1.2, normal liver function tests, normal iron studies, TSH 4.69, hemoglobin 14.1, platelets 160 K Hemoglobin A1c 6.6%  Recent Lipid Panel    Component Value Date/Time   CHOL 119 07/22/2018 1104   TRIG  92 01/26/2020 2028   HDL 40 07/22/2018 1104   CHOLHDL 3.0 07/22/2018 1104   CHOLHDL 2.8 09/16/2016 0949   VLDL 22 09/16/2016 0949   LDLCALC 55 07/22/2018 1104   06/21/2021 Total cholesterol 119, triglycerides 133, HDL 45, LDL 60, A1c 6.5%  78/29/5621 Direct LDL 64, cholesterol 127, HDL 49, triglycerides 86 Physical Exam:    VS:  BP 110/74 (BP Location: Left Arm, Patient Position: Sitting, Cuff Size: Large)   Pulse (!) 58   Ht 6\' 4"  (1.93 m)   Wt 262 lb 3.2 oz (118.9 kg)   SpO2 98%   BMI 31.92 kg/m     Wt Readings from Last 3 Encounters:  10/28/22 262 lb 3.2 oz (118.9 kg)  10/23/22 260 lb (117.9 kg)  08/05/22 263 lb (119.3 kg)     General: Alert, oriented x3, no distress, healthy left subclavian pacemaker site Head: no evidence of trauma, PERRL, EOMI, no exophtalmos or lid lag, no myxedema, no xanthelasma; normal ears, nose and oropharynx Neck: normal jugular venous pulsations and no hepatojugular reflux; brisk carotid pulses without delay and no carotid bruits Chest: clear to auscultation, no signs of consolidation by percussion or palpation, normal fremitus, symmetrical and full respiratory excursions Cardiovascular: normal position and quality of the apical impulse, regular rhythm, normal first and second heart sounds, no murmurs, rubs or gallops Abdomen: no tenderness or distention, no masses by palpation, no abnormal pulsatility or arterial bruits, normal bowel sounds, no hepatosplenomegaly Extremities: no clubbing, cyanosis or edema; 2+ radial, ulnar  and brachial pulses bilaterally; 2+ right femoral, posterior tibial and dorsalis pedis pulses; 2+ left femoral, posterior tibial and dorsalis pedis pulses; no subclavian or femoral bruits Neurological: grossly nonfocal Psych: Normal mood and affect     ASSESSMENT:    1. Acute on chronic combined systolic and diastolic CHF (congestive heart failure) (HCC)   2. Chest pain of uncertain etiology          PLAN:    In order of problems listed above:  CHF: Acute exacerbation of heart failure leading to PND and emergency room visit.  There is evidence of volume overload that precedes the acute events by more than 2 weeks suggesting that his chest discomfort was likely due to subendocardial ischemia in the setting of heart failure, rather than the heart failure was caused by a new ischemic event.  Nevertheless need to reevaluate left ventricular function with echocardiography and screen for reversible ischemia with a PET scan.  Will start SGLT2 inhibitor and plan to start spironolactone soon (will first make sure that he does not need another cardiac catheterization).  Will give him a supply of furosemide to use "as needed" for evidence of heart failure worsening either based on his weight or ICD OptiVol downloads. Paroxysmal atrial fibrillation: Detected by his device but no recurrence since August 2023.  CHA2DS2-VASc 6 (age 73, CHF, CAD, DM, HTN) new diagnosis.  Asymptomatic.  Ventricular rate control is fair if not perfect.  Overall burden of arrhythmia is quite low and antiarrhythmics are not justified.  On Eliquis.   Ventricular flutter:     A single event occurred in June 2022 and was immediately and appropriately treated by his defibrillator.  No recurrence since then.  The trigger was never identified.  We have intensified his heart failure therapy since then. CAD: Based on the available data it is most likely that his recent chest discomfort was due to heart failure exacerbation causing  subendocardial ischemia.  SPECT nuclear perfusion study  in 07/30/2020 showed extensive scar but no ischemia and he does not have ongoing angina pectoris.   S/p anterior MI 1998, s/p LAD-DES 09/05/2016.  On beta-blocker.  Aspirin stopped due to to full anticoagulation.  On statin. HTN: Well-controlled DM: Excellent Control, A1c 6.5% HLP: On statin.  Labs monitored by PCP.  Target LDL less than 70. ICD: Normal device function.  Continue remote downloads every 3 months.  Consider enrolling him in the heart failure device clinic. Obese: Should keep trying to lose weight.    Medication Adjustments/Labs and Tests Ordered: Current medicines are reviewed at length with the patient today.  Concerns regarding medicines are outlined above.  Orders Placed This Encounter  Procedures   NM PET CT CARDIAC PERFUSION MULTI W/ABSOLUTE BLOODFLOW   Cardiac Stress Test: Informed Consent Details: Physician/Practitioner Attestation; Transcribe to consent form and obtain patient signature   ECHOCARDIOGRAM COMPLETE   Patient Instructions  Medication Instructions:  Lasix 20 mg= 1 tablet daily AS NEEDED for:  Weight gain of 3 pounds in day or 5 pounds in a week Jardiance 10 mg daily *If you need a refill on your cardiac medications before your next appointment, please call your pharmacy*  Testing/Procedures: Your physician has requested that you have an echocardiogram. Echocardiography is a painless test that uses sound waves to create images of your heart. It provides your doctor with information about the size and shape of your heart and how well your heart's chambers and valves are working. This procedure takes approximately one hour. There are no restrictions for this procedure. Please do NOT wear cologne, perfume, aftershave, or lotions (deodorant is allowed). Please arrive 15 minutes prior to your appointment time.   How to Prepare for Your Cardiac PET/CT Stress Test:  1. Please do not take these medications  before your test:   Medications that may interfere with the cardiac pharmacological stress agent (ex. nitrates - including erectile dysfunction medications, isosorbide mononitrate, tamulosin or beta-blockers) the day of the exam. (Erectile dysfunction medication should be held for at least 72 hrs prior to test) Theophylline containing medications for 12 hours. Dipyridamole 48 hours prior to the test. Your remaining medications may be taken with water.  2. Nothing to eat or drink, except water, 3 hours prior to arrival time.   NO caffeine/decaffeinated products, or chocolate 12 hours prior to arrival.  3. NO perfume, cologne or lotion  4. Total time is 1 to 2 hours; you may want to bring reading material for the waiting time.  5. Please report to Radiology at the [facility] Main Entrance 30 minutes early for your test.  [address] [place], [place] [address]  Diabetic Preparation:  Hold oral medications. You may take NPH and Lantus insulin. Do not take Humalog or Humulin R (Regular Insulin) the day of your test. Check blood sugars prior to leaving the house. If able to eat breakfast prior to 3 hour fasting, you may take all medications, including your insulin, Do not worry if you miss your breakfast dose of insulin - start at your next meal.  IF YOU THINK YOU MAY BE PREGNANT, OR ARE NURSING PLEASE INFORM THE TECHNOLOGIST.  In preparation for your appointment, medication and supplies will be purchased.  Appointment availability is limited, so if you need to cancel or reschedule, please call the Radiology Department at [phone]  24 hours in advance to avoid a cancellation fee of $100.00  What to Expect After you Arrive:  Once you arrive and check  in for your appointment, you will be taken to a preparation room within the Radiology Department.  A technologist or Nurse will obtain your medical history, verify that you are correctly prepped for the exam, and explain the  procedure.  Afterwards,  an IV will be started in your arm and electrodes will be placed on your skin for EKG monitoring during the stress portion of the exam. Then you will be escorted to the PET/CT scanner.  There, staff will get you positioned on the scanner and obtain a blood pressure and EKG.  During the exam, you will continue to be connected to the EKG and blood pressure machines.  A small, safe amount of a radioactive tracer will be injected in your IV to obtain a series of pictures of your heart along with an injection of a stress agent.    After your Exam:  It is recommended that you eat a meal and drink a caffeinated beverage to counter act any effects of the stress agent.  Drink plenty of fluids for the remainder of the day and urinate frequently for the first couple of hours after the exam.  Your doctor will inform you of your test results within 7-10 business days.  For questions about your test or how to prepare for your test, please call: Rockwell Alexandria, Cardiac Imaging Nurse Navigator  Larey Brick, Cardiac Imaging Nurse Navigator Office: [phone]    Follow-Up: At [facility], you and your health needs are our priority.  As part of our continuing mission to provide you with exceptional heart care, we have created designated Provider Care Teams.  These Care Teams include your primary Cardiologist (physician) and Advanced Practice Providers (APPs -  Physician Assistants and Nurse Practitioners) who all work together to provide you with the care you need, when you need it.  We recommend signing up for the patient portal called "MyChart".  Sign up information is provided on this After Visit Summary.  MyChart is used to connect with patients for Virtual Visits (Telemedicine).  Patients are able to view lab/test results, encounter notes, upcoming appointments, etc.  Non-urgent messages can be sent to your provider as well.   To learn more about what you can do with MyChart,  go to [email or web address].    Your next appointment:   4 week(s) with an APP  Other Instructions Keep a log of daily weights and bring it to your next appointment.     Signed, [name], MD  10/30/2022 4:42 PM    [facility]

## 2022-10-28 NOTE — Patient Instructions (Signed)
Medication Instructions:  Lasix 20 mg= 1 tablet daily AS NEEDED for:  Weight gain of 3 pounds in day or 5 pounds in a week Jardiance 10 mg daily *If you need a refill on your cardiac medications before your next appointment, please call your pharmacy*  Testing/Procedures: Your physician has requested that you have an echocardiogram. Echocardiography is a painless test that uses sound waves to create images of your heart. It provides your doctor with information about the size and shape of your heart and how well your heart's chambers and valves are working. This procedure takes approximately one hour. There are no restrictions for this procedure. Please do NOT wear cologne, perfume, aftershave, or lotions (deodorant is allowed). Please arrive 15 minutes prior to your appointment time.   How to Prepare for Your Cardiac PET/CT Stress Test:  1. Please do not take these medications before your test:   Medications that may interfere with the cardiac pharmacological stress agent (ex. nitrates - including erectile dysfunction medications, isosorbide mononitrate, tamulosin or beta-blockers) the day of the exam. (Erectile dysfunction medication should be held for at least 72 hrs prior to test) Theophylline containing medications for 12 hours. Dipyridamole 48 hours prior to the test. Your remaining medications may be taken with water.  2. Nothing to eat or drink, except water, 3 hours prior to arrival time.   NO caffeine/decaffeinated products, or chocolate 12 hours prior to arrival.  3. NO perfume, cologne or lotion  4. Total time is 1 to 2 hours; you may want to bring reading material for the waiting time.  5. Please report to Radiology at the [facility] Main Entrance 30 minutes early for your test.  [address] [place], [place] [address]  Diabetic Preparation:  Hold oral medications. You may take NPH and Lantus insulin. Do not take Humalog or Humulin R (Regular Insulin) the  day of your test. Check blood sugars prior to leaving the house. If able to eat breakfast prior to 3 hour fasting, you may take all medications, including your insulin, Do not worry if you miss your breakfast dose of insulin - start at your next meal.  IF YOU THINK YOU MAY BE PREGNANT, OR ARE NURSING PLEASE INFORM THE TECHNOLOGIST.  In preparation for your appointment, medication and supplies will be purchased.  Appointment availability is limited, so if you need to cancel or reschedule, please call the Radiology Department at [phone]  24 hours in advance to avoid a cancellation fee of $100.00  What to Expect After you Arrive:  Once you arrive and check in for your appointment, you will be taken to a preparation room within the Radiology Department.  A technologist or Nurse will obtain your medical history, verify that you are correctly prepped for the exam, and explain the procedure.  Afterwards,  an IV will be started in your arm and electrodes will be placed on your skin for EKG monitoring during the stress portion of the exam. Then you will be escorted to the PET/CT scanner.  There, staff will get you positioned on the scanner and obtain a blood pressure and EKG.  During the exam, you will continue to be connected to the EKG and blood pressure machines.  A small, safe amount of a radioactive tracer will be injected in your IV to obtain a series of pictures of your heart along with an injection of a stress agent.    After your Exam:  It is recommended that you eat a  meal and drink a caffeinated beverage to counter act any effects of the stress agent.  Drink plenty of fluids for the remainder of the day and urinate frequently for the first couple of hours after the exam.  Your doctor will inform you of your test results within 7-10 business days.  For questions about your test or how to prepare for your test, please call: Rockwell Alexandria, Cardiac Imaging Nurse Navigator  Larey Brick,  Cardiac Imaging Nurse Navigator Office: [phone]    Follow-Up: At [facility], you and your health needs are our priority.  As part of our continuing mission to provide you with exceptional heart care, we have created designated Provider Care Teams.  These Care Teams include your primary Cardiologist (physician) and Advanced Practice Providers (APPs -  Physician Assistants and Nurse Practitioners) who all work together to provide you with the care you need, when you need it.  We recommend signing up for the patient portal called "MyChart".  Sign up information is provided on this After Visit Summary.  MyChart is used to connect with patients for Virtual Visits (Telemedicine).  Patients are able to view lab/test results, encounter notes, upcoming appointments, etc.  Non-urgent messages can be sent to your provider as well.   To learn more about what you can do with MyChart, go to [email or web address].    Your next appointment:   4 week(s) with an APP  Other Instructions Keep a log of daily weights and bring it to your next appointment.

## 2022-10-30 ENCOUNTER — Encounter: Payer: Self-pay | Admitting: Cardiovascular Disease

## 2022-10-30 ENCOUNTER — Ambulatory Visit (INDEPENDENT_AMBULATORY_CARE_PROVIDER_SITE_OTHER): Payer: Medicare Other

## 2022-10-30 DIAGNOSIS — I255 Ischemic cardiomyopathy: Secondary | ICD-10-CM

## 2022-10-30 DIAGNOSIS — I5022 Chronic systolic (congestive) heart failure: Secondary | ICD-10-CM

## 2022-10-30 LAB — CUP PACEART REMOTE DEVICE CHECK
Battery Remaining Longevity: 67 mo
Battery Voltage: 2.98 V
Brady Statistic RV Percent Paced: 0 %
Date Time Interrogation Session: 20240522001704
HighPow Impedance: 64 Ohm
Implantable Lead Connection Status: 753985
Implantable Lead Implant Date: 20180905
Implantable Lead Location: 753860
Implantable Pulse Generator Implant Date: 20180905
Lead Channel Impedance Value: 361 Ohm
Lead Channel Impedance Value: 399 Ohm
Lead Channel Pacing Threshold Amplitude: 0.625 V
Lead Channel Pacing Threshold Pulse Width: 0.4 ms
Lead Channel Sensing Intrinsic Amplitude: 10.875 mV
Lead Channel Sensing Intrinsic Amplitude: 10.875 mV
Lead Channel Setting Pacing Amplitude: 2.5 V
Lead Channel Setting Pacing Pulse Width: 0.4 ms
Lead Channel Setting Sensing Sensitivity: 0.3 mV
Zone Setting Status: 755011
Zone Setting Status: 755011

## 2022-11-06 ENCOUNTER — Telehealth: Payer: Self-pay | Admitting: Cardiovascular Disease

## 2022-11-06 NOTE — Telephone Encounter (Signed)
Patient stated he would like to apply for patient assistance to help with the cost of entresto and jardiance. He would like for the paper to be mailed to his home

## 2022-11-06 NOTE — Telephone Encounter (Signed)
Pt c/o medication issue:  1. Name of Medication: sacubitril-valsartan (ENTRESTO) 97-103 MG  empagliflozin (JARDIANCE) 10 MG TABS tablet   2. How are you currently taking this medication (dosage and times per day)? As prescribed  3. Are you having a reaction (difficulty breathing--STAT)? No   4. What is your medication issue? Patient is requesting a callback to renew patient assistance for medications.   Please advise.

## 2022-11-06 NOTE — Telephone Encounter (Signed)
Could apply for [employer] cardiomyopathy grant instead. Called pt to discuss. LVM for him to call back.

## 2022-11-11 ENCOUNTER — Ambulatory Visit: Payer: Medicare Other | Attending: Cardiovascular Disease

## 2022-11-11 DIAGNOSIS — Z95810 Presence of automatic (implantable) cardiac defibrillator: Secondary | ICD-10-CM

## 2022-11-11 DIAGNOSIS — I5022 Chronic systolic (congestive) heart failure: Secondary | ICD-10-CM

## 2022-11-12 ENCOUNTER — Telehealth: Payer: Self-pay

## 2022-11-12 NOTE — Telephone Encounter (Signed)
ICM call to patient. During conversation he reports he has not been contacted about scheduling PET scan as discussed at OV with Dr Royann Shivers.  He would like a call back on his cell number [phone] regarding when the test will be scheduled.    Sent to Dr Croitoru's nurse, Orpah Cobb, RN for follow up.

## 2022-11-12 NOTE — Telephone Encounter (Signed)
Spoke with spouse (dpr) explained that I sent a message to the scheduling department about the PET. It can take a while for the insurance precertification; then they will receive a call to get the PET scheduled. She verbalized understanding.

## 2022-11-12 NOTE — Progress Notes (Signed)
EPIC Encounter for ICM Monitoring  Patient Name: Kevin Patel is a 77 y.o. male Date: 11/12/2022 Primary Care Physican: Kevin Patel., MD Primary Cardiologist: Kevin Patel Electrophysiologist: Kevin Patel 11/12/2022 Weight: 249 lbs                                                            1st ICM call and ICM intro given.  Followed patient for a few months in 2023 and he remembers and agreed to monthly monitor as Dr Kevin Patel suggested.   Spoke with patient and heart failure questions reviewed.  Pt asymptomatic for fluid accumulation and feeling well.   He has been taking PRN Furosemide a couple of times a week for weight gain of 1-2 lbs.   He reports he has not been contacted for PET scan and would for Dr Croitorus office to call him.     Optivol thoracic impedance suggesting normal fluid levels since 5/13.   Prescribed:  Furosemide 20 mg take 1 tablet by mouth as needed for weight gain of 3 pounds in a day or 5 pounds in a week.    Labs: 10/23/2022 Creatinine 1.17, BUN 12, Potassium 3.5, Sodium 142, GFR >60 A complete set of results can be found in Results Review.   Recommendations:  No changes and encouraged to call if experiencing any fluid symptoms.   Follow-up plan: Next ICM remote phone appointment 12/16/2022.    91 day device clinic remote transmission 01/29/2023.     EP/Cardiology Office Visits:  12/09/2022 with Kevin Levering, NP.    Copy of ICM check sent to Dr. Royann Patel.  3 month ICM trend: 11/11/2022.    12-14 Month ICM trend:     Kevin Soda, RN 11/12/2022 4:15 PM

## 2022-11-20 NOTE — Progress Notes (Signed)
Remote ICD transmission.   

## 2022-11-27 ENCOUNTER — Ambulatory Visit (HOSPITAL_COMMUNITY): Payer: Medicare Other | Attending: Cardiovascular Disease

## 2022-11-27 DIAGNOSIS — I5043 Acute on chronic combined systolic (congestive) and diastolic (congestive) heart failure: Secondary | ICD-10-CM | POA: Diagnosis present

## 2022-11-27 LAB — ECHOCARDIOGRAM COMPLETE
Area-P 1/2: 1.57 cm2
S' Lateral: 5.7 cm

## 2022-11-27 MED ORDER — PERFLUTREN LIPID MICROSPHERE
1.0000 mL | INTRAVENOUS | Status: AC | PRN
Start: 2022-11-27 — End: 2022-11-27
  Administered 2022-11-27: 2 mL via INTRAVENOUS

## 2022-12-07 NOTE — Progress Notes (Deleted)
Cardiology Clinic Note   Date: 12/07/2022 ID: Kevin Patel 04-Jul-1945, MRN [id number]  Primary Cardiologist:  [name], MD  Patient Profile    [name] is a 77 y.o. male who presents to the clinic today for ***    Past medical history significant for: CAD.  LHC 1998 (MI): PCI with stenting to proximal LAD. LHC 09/05/2016 (NSTEMI): Proximal RCA 30%.  Mid RCA 30%.  Ostial OM 2 to OM 220%.  Ostial to proximal CX 20%.  Proximal LAD 20%.  Ostial LAD 99%.  Distal LAD 90%.  Severe LV dysfunction.  Moderately elevated LVEDP.  Severe single-vessel CAD with ulcerated stenosis ostial LAD.  PCI with DES to ostial LAD overlapping previously placed proximal LAD stent.  Recommendation for LifeVest prior to discharge given LV dysfunction. 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 Remote device check 10/30/2022: Normal device function.  Not pacemaker dependent.  Battery status good, lead measurements stable.  No clinically significant episodes of high ventricular rate or atrial mode switch.  Thoracic impedance suggests volume overload for the last 3 to 4 weeks that is close to returning to normal. Echo 11/27/2022: EF 25 to 30%.  Mid to apical anterior, mid to apical septal, apical lateral, apical inferior and LV apex are akinetic.  No LV thrombus.  Grade II DD.  Normal RV function.  Mild LAE.  Aortic valve sclerosis/calcification without stenosis.  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  No significant change from prior study July 2022. Hypertension. Hyperlipidemia. Lipid panel 06/26/2022: LDL 65, HDL 40, TG 82, total 121. CKD stage III. T2DM. Hypothyroidism.     History of Present Illness    Kevin Patel is a longtime patient of cardiology.  He was followed by Dr. Allyson Sabal for his history of ischemic cardiomyopathy status post MI in 1998 treated with PCI stenting by Dr. Alanda Amass.  His EF was 35-45 with a questionable mural thrombus for which  she had been on Coumadin for anticoagulation.  A follow-up echo performed in September 2013 showed EF 35 to 40% with no evidence of mural thrombus therefore Coumadin was discontinued.  In March 2018 he suffered an NSTEMI and underwent PCI with DES to ostial LAD.  His LV function was severely reduced with EF 20 to 25%.  LV function improved slightly to 30 to 35% per repeat echo July 2018.  He was referred to Dr. Royann Shivers on 01/30/2017 for consideration of ICD implantation for primary prevention.  ICD was implanted September 2018.  Continues to be followed by Dr. Robyne Askew for the above outlined history.  She was last seen in the office by Dr. Royann Shivers on 10/28/2022 ***  Today, patient ***   ROS: All other systems reviewed and are otherwise negative except as noted in History of Present Illness.  Studies Reviewed       ***  Risk Assessment/Calculations    {Does this patient have ATRIAL FIBRILLATION?:[id number]} No BP recorded.  {Refresh Note OR Click here to enter BP  :1}***        Physical Exam    VS:  There were no vitals taken for this visit. , BMI There is no height or weight on file to calculate BMI.  GEN: Well nourished, well developed, in no acute distress. Neck: No JVD or carotid bruits. Cardiac: *** RRR. No murmurs. No rubs or gallops.   Respiratory:  Respirations regular and unlabored. Clear to auscultation without rales, wheezing or rhonchi. GI: Soft, nontender, nondistended. Extremities: Radials/DP/PT 2+  and equal bilaterally. No clubbing or cyanosis. No edema ***  Skin: Warm and dry, no rash. Neuro: Strength intact.  Assessment & Plan   ***  Disposition: ***     {Are you ordering a CV Procedure (e.g. stress test, cath, DCCV, TEE, etc)?   Press F2        :[id number]}   Signed, Etta Grandchild. Sylina Henion, DNP, NP-C

## 2022-12-09 ENCOUNTER — Ambulatory Visit: Payer: Medicare Other | Admitting: Student

## 2022-12-15 NOTE — Progress Notes (Unsigned)
Cardiology Office Note:  .   Date:  12/16/2022  ID:  DAIWIK BEARDSLEE, DOB 12/16/1945, MRN [id number] PCP: [name]., MD  [facility] Providers Cardiologist:  [name], MD History of Present Illness: .   Kevin Patel is a 77 y.o. male with a history of CAD, severe ischemic cardiomyopathy, ICD in place, paroxysmal atrial fibrillation on eliquis, HTN, HLD, DM. Patient previously had an LV apical thrombus, but this has not been seen on recent echocardiograms since 2012. Patient had a left heart catheterization in 07/2016 that showed severe single vessel CAD with severe stenosis in the ostial LAD. Patient was treated with successful PTCA/DESx1 to the ostial LAD. Echocardiogram 08/2016 showed EF 20-25%, moderate LVH, grade I DD, no apical thrombus present. EF remained reduced on echo in 12/2016, and patient had an ICD placed in 02/2017. In 11/2020, patient had an episode of sustained vflutter and received an appropriate ICD shock. Echocardiogram 12/2020 showed EF 25-30% with regional wall motion abnormalities, normal RV systolic function, mild mitral regurgitation. Nuclear stress test 12/2020 was consistent with prior multivessel myocardial infarctions. ICD interrogation in 01/2022 detected atrial fibrillation with RVR. He was started on eliquis.   Patient was last seen by Dr. Royann Shivers on 10/28/22. At that time, patient had recently been seen in the ED and was found to have CHF exacerbation. He was treated with 3 days of oral lasix with improvement in symptoms. Patient was started on SGLT2i and was given a prescription for PRN lasix. Remained on eliquis, entresto, carvedilol. Follow up echocardiogram 11/2022 showed EF 25-30%, no LV thrombus, regional wall motion abnormalities, grade II DD, normal RV systolic function, mild MR, borderline dilation of the aortic root measuring 39 mm.   Today, patient reports that he has been doing well from a cardiac standpoint. He denies chest pain, palpitations,  syncope, near syncope, dizziness, lightheadedness. He denies any recent ICD shocks. He occasionally gets shortness of breath and a congested cough, but his symptoms improve with lasix. He checks his weights regularly, and knows to take lasix if he has weight gain. Takes lasix about 1-2 days a week. He was not contacted to schedule PET scan.   ROS: Denies chest pain, palpitations, dizziness, near syncope, syncope. Occasionally has shortness of breath and cough that resolves with lasix   Studies Reviewed: .    Cardiac Studies & Procedures   CARDIAC CATHETERIZATION  CARDIAC CATHETERIZATION 09/05/2016  Narrative  Prox RCA lesion, 30 %stenosed.  Mid RCA lesion, 30 %stenosed.  Ost 2nd Mrg to 2nd Mrg lesion, 20 %stenosed.  Ost Cx to Prox Cx lesion, 20 %stenosed.  Prox LAD lesion, 20 %stenosed.  A STENT SYNERGY DES 4X16 drug eluting stent was successfully placed, and overlaps previously placed stent.  Ost LAD lesion, 99 %stenosed.  Post intervention, there is a 0% residual stenosis.  Dist LAD lesion, 90 %stenosed.  The left ventricular ejection fraction is less than 25% by visual estimate.  There is severe left ventricular systolic dysfunction.  LV end diastolic pressure is moderately elevated.  There is no mitral valve regurgitation.  1. Severe single vessel CAD. Severe ulcerated stenosis ostial LAD 2. Successful PTCA/DES x 1 ostial LAD 3. Severe segmental LV systolic dysfunction  Recommendations: DAPT for at least one year with ASA and Brilinta. Continue Ace-inh and beta blocker. Consider Lifevest before discharge given LV systolic dysfunction. He may benefit from mild diuresis as well. I will give one dose of IV Lasix today.  Findings Coronary Findings Diagnostic  Dominance: Right  Left Anterior Descending Vessel is large. The lesion is ulcerative. The lesion was not previously treated. The lesion was previously treated using a bare metal stent over 2 years ago.  Previously placed stent displays restenosis.  First Diagonal Branch Vessel is moderate in size.  Second Diagonal Branch Vessel is moderate in size.  Second Septal Branch Vessel is small in size.  Third Diagonal Branch Vessel is small in size.  Third Septal Branch Vessel is small in size.  Ramus Intermedius Vessel is small.  Lateral Ramus Intermedius Vessel is small in size.  Left Circumflex Vessel is large.  First Obtuse Marginal Branch Vessel is small in size.  Second Obtuse Marginal Branch Vessel is large in size.  Third Obtuse Marginal Branch Vessel is small in size.  Right Coronary Artery Vessel is large.  Right Posterior Descending Artery Vessel is moderate in size.  Intervention  Ost LAD lesion Angioplasty Lesion crossed with guidewire. Pre-stent angioplasty was performed using a BALLOON EUPHORA [id number]. A STENT SYNERGY DES 4X16 drug eluting stent was successfully placed, and overlaps previously placed stent. Stent strut is well apposed. Post-stent angioplasty was performed using a BALLOON [place] EUPHORA [id number]. The pre-interventional distal flow is decreased (TIMI 1).  The post-interventional distal flow is normal (TIMI 3). The intervention was successful . No complications occurred at this lesion. Pressure wire/FFR was not performed on the lesion . IVUS was performed on the lesion post PCI using a CATH OPTICROSS. There is a 0% residual stenosis post intervention.   STRESS TESTS  MYOCARDIAL PERFUSION IMAGING 01/03/2021  Narrative  The left ventricular ejection fraction is severely decreased (<30%).  Nuclear stress EF: 26%.  There was no ST segment deviation noted during stress.  There is a large, severe, non-reversible defect in all anterior, inferior, anteroseptal, apical LV segments and apex. There is relative sparing of the lateral LV wall (except apical portion) and mid-inferoseptal LV wall.  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  This is a high risk study.  Laurance Flatten, MD   ECHOCARDIOGRAM  ECHOCARDIOGRAM COMPLETE 11/27/2022  Narrative ECHOCARDIOGRAM REPORT    Patient Name:   Kevin Patel Date of Exam: 11/27/2022 Medical Rec #:  [id number]     Height:       76.0 in Accession #:    [id number]    Weight:       262.2 lb Date of Birth:  1945-07-27    BSA:          2.485 m Patient Age:    76 years      BP:           110/74 mmHg Patient Gender: M             HR:           57 bpm. Exam Location:  [address]  Procedure: 2D Echo, Cardiac Doppler, Color Doppler and Intracardiac Opacification Agent  Indications:    I50.43 Acute on chronic combined systolic (congestive) and diastolic (congestive) heart failure  History:        Patient has prior history of Echocardiogram examinations, most recent 12/29/2020. CAD and Previous Myocardial Infarction, Defibrillator, Arrythmias:Atrial Fibrillation; Risk Factors:Hypertension, Former Smoker, Dyslipidemia and Diabetes. Ischemic cardiomyopathy. History of LV apical thrombus.  Sonographer:    Cathie Beams RCS Referring Phys: [facility] CROITORU  IMPRESSIONS   1. The mid-to-apical anterior, mid-to-apical septal, apical lateral, apical inferior and LV apex are akinetic. No LV thrombus visualized on definity contrast imaging. Findings consistent  with prior LAD infarct.. Left ventricular ejection fraction, by estimation, is 25 to 30%. The left ventricle has severely decreased function. The left ventricle demonstrates regional wall motion abnormalities (see scoring diagram/findings for description). The left ventricular internal cavity size was severely dilated. Left ventricular diastolic parameters are consistent with Grade II diastolic dysfunction (pseudonormalization). 2. Right ventricular systolic function is normal. The right ventricular size is normal. Tricuspid regurgitation signal is inadequate for assessing PA pressure. 3. Left atrial size was  mildly dilated. 4. The mitral valve is grossly normal. Mild mitral valve regurgitation. 5. The aortic valve is tricuspid. There is mild calcification of the aortic valve. There is mild thickening of the aortic valve. Aortic valve regurgitation is not visualized. Aortic valve sclerosis/calcification is present, without any evidence of aortic stenosis. 6. Aortic dilatation noted. There is borderline dilatation of the aortic root, measuring 39 mm.  Comparison(s): No significant change from prior study.  FINDINGS Left Ventricle: The mid-to-apical anterior, mid-to-apical septal, apical lateral, apical inferior and LV apex are akinetic. No LV thrombus visualized on definity contrast imaging. Findings consistent with prior LAD infarct. Left ventricular ejection fraction, by estimation, is 25 to 30%. The left ventricle has severely decreased function. The left ventricle demonstrates regional wall motion abnormalities. The left ventricular internal cavity size was severely dilated. There is no left ventricular hypertrophy. Left ventricular diastolic parameters are consistent with Grade II diastolic dysfunction (pseudonormalization).  Right Ventricle: The right ventricular size is normal. No increase in right ventricular wall thickness. Right ventricular systolic function is normal. Tricuspid regurgitation signal is inadequate for assessing PA pressure.  Left Atrium: Left atrial size was mildly dilated.  Right Atrium: Right atrial size was normal in size.  Pericardium: There is no evidence of pericardial effusion.  Mitral Valve: The mitral valve is grossly normal. There is mild thickening of the mitral valve leaflet(s). There is mild calcification of the mitral valve leaflet(s). Mild mitral valve regurgitation.  Tricuspid Valve: The tricuspid valve is normal in structure. Tricuspid valve regurgitation is mild.  Aortic Valve: The aortic valve is tricuspid. There is mild calcification of the aortic  valve. There is mild thickening of the aortic valve. Aortic valve regurgitation is not visualized. Aortic valve sclerosis/calcification is present, without any evidence of aortic stenosis.  Pulmonic Valve: The pulmonic valve was grossly normal. Pulmonic valve regurgitation is trivial.  Aorta: Aortic dilatation noted. There is borderline dilatation of the aortic root, measuring 39 mm.  IAS/Shunts: The atrial septum is grossly normal.  Additional Comments: A device lead is visualized.   LEFT VENTRICLE PLAX 2D LVIDd:         7.20 cm   Diastology LVIDs:         5.70 cm   LV e' medial:    4.73 cm/s LV PW:         1.00 cm   LV E/e' medial:  12.5 LV IVS:        0.80 cm   LV e' lateral:   11.00 cm/s LVOT diam:     2.30 cm   LV E/e' lateral: 5.4 LV SV:         73 LV SV Index:   29 LVOT Area:     4.15 cm   RIGHT VENTRICLE RV Basal diam:  2.60 cm RV S prime:     11.40 cm/s TAPSE (M-mode): 2.1 cm  LEFT ATRIUM           Index        RIGHT ATRIUM  Index LA diam:      4.50 cm 1.81 cm/m   RA Area:     16.50 cm LA Vol (A2C): 87.3 ml 35.12 ml/m  RA Volume:   34.30 ml  13.80 ml/m LA Vol (A4C): 78.9 ml 31.75 ml/m AORTIC VALVE LVOT Vmax:   87.40 cm/s LVOT Vmean:  54.100 cm/s LVOT VTI:    0.175 m  AORTA Ao Root diam: 4.00 cm Ao Asc diam:  3.50 cm  MITRAL VALVE MV Area (PHT): 1.57 cm    SHUNTS MV Decel Time: 484 msec    Systemic VTI:  0.18 m MV E velocity: 59.20 cm/s  Systemic Diam: 2.30 cm MV A velocity: 34.60 cm/s MV E/A ratio:  1.71  Laurance Flatten MD Electronically signed by Laurance Flatten MD Signature Date/Time: 11/27/2022/12:38:39 PM    Final              Risk Assessment/Calculations:    CHA2DS2-VASc Score = 6   This indicates a 9.7% annual risk of stroke. The patient's score is based upon: CHF History: 1 HTN History: 1 Diabetes History: 1 Stroke History: 0 Vascular Disease History: 1 Age Score: 2 Gender Score: 0    Physical Exam:   VS:   BP 112/78   Pulse 61   Ht 6\' 4"  (1.93 m)   Wt 253 lb 12.8 oz (115.1 kg)   SpO2 98%   BMI 30.89 kg/m    Wt Readings from Last 3 Encounters:  12/16/22 253 lb 12.8 oz (115.1 kg)  10/28/22 262 lb 3.2 oz (118.9 kg)  10/23/22 260 lb (117.9 kg)    GEN: Well nourished, well developed in no acute distress. Sitting comfortably on the exam table  NECK: No JVD CARDIAC: RRR, no murmurs, rubs, gallops. Radial pulses 2+ bilaterally  RESPIRATORY:  Clear to auscultation without rales, wheezing or rhonchi. Normal work of breathing on room air  ABDOMEN: Soft, non-tender, non-distended EXTREMITIES:  Trace edema in BLE; No deformity   ASSESSMENT AND PLAN: .    Chronic combined systolic and diastolic heart failure  - Most recent echocardiogram from 11/2022 showed EF 25-30%, regional wall motion abnormalities, grade II DD - When last seen by Dr. Royann Shivers in 10/2022, he had ordered a PET scan to screen for reversible ischemia. Study has been ordered, but has not yet been scheduled  - Scheduled PET scan  - Patient has PRN lasix -- takes about 1-2 days per week with resolution of his symptoms. He is euvolemic on exam today.  - Continue carvedilol 25 mg BID, jardiance, entresto 97-103 mg BID. Note, patient is concerned about the cost of jardiance and entresto. Provided patient with applications for patient assistance  - Check BMP today to assess renal function and electrolytes  - BP 112/78- hold off on adding spironolactone for now   CAD  - Patient had a LHC in 08/2016 with successful PTCA/DES to the ostial LAD  - Most recent ischemic evaluation was a nuclear stress test in 12/2020 and findings were consistent with consistent with prior multivessel myocardial infarctions  - Patient denies chest pain/DOE - PET scan ordered as above- arranged study  - Continue lipitor 80 mg daily, carvedilol 25 mg BID - Not on ASA due to eliquis use   Paroxysmal Atrial Fibrillation  - PAF was detected on device check in  01/2022. 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 Had an episode of vflutter in 2022 that was appropriately treated by defibrillator - No recurrence of vflutter since 2022. Patient denies recent ICD shocks  - ICD is followed by Dr. Royann Shivers- continue remote downloads every 3 months   HLD  - Managed by PCP. Continue lipitor 80 mg daily   HTN - BP well controlled on current medications   Dispo: 2-3 months   Signed, Jonita Albee, PA-C

## 2022-12-16 ENCOUNTER — Encounter: Payer: Self-pay | Admitting: Cardiology

## 2022-12-16 ENCOUNTER — Ambulatory Visit: Payer: Medicare Other

## 2022-12-16 ENCOUNTER — Ambulatory Visit (INDEPENDENT_AMBULATORY_CARE_PROVIDER_SITE_OTHER): Payer: Medicare Other | Admitting: Cardiology

## 2022-12-16 VITALS — BP 112/78 | HR 61 | Ht 76.0 in | Wt 253.8 lb

## 2022-12-16 DIAGNOSIS — I5022 Chronic systolic (congestive) heart failure: Secondary | ICD-10-CM

## 2022-12-16 DIAGNOSIS — I5043 Acute on chronic combined systolic (congestive) and diastolic (congestive) heart failure: Secondary | ICD-10-CM | POA: Diagnosis present

## 2022-12-16 DIAGNOSIS — E7800 Pure hypercholesterolemia, unspecified: Secondary | ICD-10-CM | POA: Diagnosis present

## 2022-12-16 DIAGNOSIS — I480 Paroxysmal atrial fibrillation: Secondary | ICD-10-CM | POA: Insufficient documentation

## 2022-12-16 DIAGNOSIS — I4902 Ventricular flutter: Secondary | ICD-10-CM

## 2022-12-16 DIAGNOSIS — I255 Ischemic cardiomyopathy: Secondary | ICD-10-CM | POA: Diagnosis present

## 2022-12-16 DIAGNOSIS — I10 Essential (primary) hypertension: Secondary | ICD-10-CM

## 2022-12-16 DIAGNOSIS — I2510 Atherosclerotic heart disease of native coronary artery without angina pectoris: Secondary | ICD-10-CM | POA: Insufficient documentation

## 2022-12-16 DIAGNOSIS — I5042 Chronic combined systolic (congestive) and diastolic (congestive) heart failure: Secondary | ICD-10-CM | POA: Diagnosis not present

## 2022-12-16 DIAGNOSIS — Z95810 Presence of automatic (implantable) cardiac defibrillator: Secondary | ICD-10-CM | POA: Diagnosis present

## 2022-12-16 MED ORDER — EMPAGLIFLOZIN 10 MG PO TABS
10.0000 mg | ORAL_TABLET | Freq: Every day | ORAL | 0 refills | Status: DC
Start: 2022-12-16 — End: 2023-08-26

## 2022-12-16 MED ORDER — APIXABAN 5 MG PO TABS
5.0000 mg | ORAL_TABLET | Freq: Two times a day (BID) | ORAL | 0 refills | Status: DC
Start: 2022-12-16 — End: 2024-01-12

## 2022-12-16 NOTE — Patient Instructions (Signed)
Medication Instructions:  Your physician recommends that you continue on your current medications as directed. Please refer to the Current Medication list given to you today.  *If you need a refill on your cardiac medications before your next appointment, please call your pharmacy*   Lab Work: Your physician recommends that you have labs drawn today: BMET  If you have labs (blood work) drawn today and your tests are completely normal, you will receive your results only by: MyChart Message (if you have MyChart) OR A paper copy in the mail If you have any lab test that is abnormal or we need to change your treatment, we will call you to review the results.   Follow-Up: At [facility], you and your health needs are our priority.  As part of our continuing mission to provide you with exceptional heart care, we have created designated Provider Care Teams.  These Care Teams include your primary Cardiologist (physician) and Advanced Practice Providers (APPs -  Physician Assistants and Nurse Practitioners) who all work together to provide you with the care you need, when you need it.  We recommend signing up for the patient portal called "MyChart".  Sign up information is provided on this After Visit Summary.  MyChart is used to connect with patients for Virtual Visits (Telemedicine).  Patients are able to view lab/test results, encounter notes, upcoming appointments, etc.  Non-urgent messages can be sent to your provider as well.   To learn more about what you can do with MyChart, go to [email or web address].    Your next appointment:   2-3 month(s)  Provider:   [name], MD

## 2022-12-17 LAB — BASIC METABOLIC PANEL
BUN/Creatinine Ratio: 9 — ABNORMAL LOW (ref 10–24)
BUN: 10 mg/dL (ref 8–27)
CO2: 26 mmol/L (ref 20–29)
Calcium: 9.1 mg/dL (ref 8.6–10.2)
Chloride: 105 mmol/L (ref 96–106)
Creatinine, Ser: 1.08 mg/dL (ref 0.76–1.27)
Glucose: 119 mg/dL — ABNORMAL HIGH (ref 70–99)
Potassium: 4.2 mmol/L (ref 3.5–5.2)
Sodium: 144 mmol/L (ref 134–144)
eGFR: 71 mL/min/{1.73_m2} (ref >59)

## 2022-12-17 LAB — SPECIMEN STATUS REPORT

## 2022-12-18 ENCOUNTER — Telehealth: Payer: Self-pay

## 2022-12-18 NOTE — Progress Notes (Signed)
Spoke with patient and heart failure questions reviewed.  Transmission results reviewed.  Pt asymptomatic for fluid accumulation.  Reports feeling well at this time and voices no complaints.  No changes and encouraged to call if experiencing any fluid symptoms. 

## 2022-12-18 NOTE — Progress Notes (Signed)
EPIC Encounter for ICM Monitoring  Patient Name: LEMONTE AL is a 77 y.o. male Date: 12/18/2022 Primary Care Physican: [name]., MD Primary Cardiologist: Croitoru Electrophysiologist: Croitoru 11/12/2022 Weight: 249 lbs                                                            Attempted call to patient and unable to reach.  Left detailed message per DPR regarding transmission. Transmission reviewed.   Optivol thoracic impedance suggesting normal fluid levels within the last month.   Prescribed:  Furosemide 20 mg take 1 tablet by mouth as needed for weight gain of 3 pounds in a day or 5 pounds in a week.  Usually takes 1-2 times a week    Labs: 10/23/2022 Creatinine 1.17, BUN 12, Potassium 3.5, Sodium 142, GFR >60 A complete set of results can be found in Results Review.   Recommendations:  Unable to reach.     Follow-up plan: Next ICM remote phone appointment 01/20/2023.    91 day device clinic remote transmission 01/29/2023.     EP/Cardiology Office Visits:  02/24/2023 with Dr Royann Shivers.    Copy of ICM check sent to Dr. Royann Shivers.   3 month ICM trend: 12/16/2022.    12-14 Month ICM trend:     Karie Soda, RN 12/18/2022 2:13 PM

## 2022-12-18 NOTE — Telephone Encounter (Signed)
Remote ICM transmission received.  Attempted call to patient regarding ICM remote transmission and left message to return call   

## 2022-12-23 ENCOUNTER — Telehealth: Payer: Self-pay | Admitting: Cardiovascular Disease

## 2022-12-23 NOTE — Telephone Encounter (Signed)
Pt's daughter calling to ask for paperwork for assistance to be sent to the pharmacy [facility] in [place]), a list of medications and how much they cost out of pocket. Please advise.

## 2022-12-23 NOTE — Telephone Encounter (Signed)
Left voicemail message for the pt to call the clinic. 

## 2022-12-25 NOTE — Telephone Encounter (Signed)
Left message for pt daughter to call  ?

## 2022-12-30 ENCOUNTER — Telehealth: Payer: Self-pay | Admitting: Cardiovascular Disease

## 2022-12-30 NOTE — Telephone Encounter (Signed)
Paper Work Dropped Off: Medication assistance application  Date: 07.22.24   Location of paper: Dr [employer]

## 2022-12-31 NOTE — Telephone Encounter (Signed)
Patient ask for patient assistance paperwork for the following meds: Entresto, Eliquis and Jardiance.  Patient states he brought paperwork to office.  Please advise once completed. Advised he will be contacted if any information needed and/or once approved.

## 2022-12-31 NOTE — Telephone Encounter (Signed)
Lvm that we have made several attempts to contact with no response.  If patient still has questions, please call our office

## 2023-01-03 NOTE — Telephone Encounter (Signed)
Spoke with the patient and gave the information from previous note. He will bring his insurance cards next week so we can make a copy to add to application.  He said that his daughter told him that the pharmacy faxed the cost of prescriptions/information to our office. I told her that I would look further into this, as I do not see this information.   Spoke with him again and said I did not see the prescription cost information. He said he will contact his pharmacy and if needed, he will go there and get a copy and bring with him when he brings his cards to be copied.   He plans to come here on Wednesday to make copies of insurance cards.

## 2023-01-03 NOTE — Telephone Encounter (Signed)
Pt dropped off pt assistance paperwork and it had a note saying that he had some questions. Called the patient and left message with call back number.     Some things that are missing: copies of insurance cards and a print out of the cost of his prescriptions- him and his spouse. He needs to go to his pharmacy and ask for a print out of how much he has spent on prescriptions so far this year- him and his wife.

## 2023-01-03 NOTE — Telephone Encounter (Signed)
Faxed Pt Assistance applications for [employer].  Need a few more items for Eliquis PA application; Some things that are missing: copies of insurance cards and a print out of the cost of his prescriptions- him and his spouse. He needs to go to his pharmacy and ask for a print out of how much he has spent on prescriptions so far this year- him and his wife.   We can make copies of insurance cards here; bring items and I will fax in the Eliquis application.

## 2023-01-07 ENCOUNTER — Telehealth (HOSPITAL_COMMUNITY): Payer: Self-pay | Admitting: *Deleted

## 2023-01-07 NOTE — Telephone Encounter (Signed)
Reaching out to patient to offer assistance regarding upcoming cardiac imaging study; pt verbalizes understanding of appt date/time, parking situation and where to check in, pre-test NPO status, and verified current allergies; name and call back number provided for further questions should they arise  Merle Prescott RN Navigator Cardiac Imaging  Heart and Vascular [phone] office [phone] cell  Patient aware to avoid caffeine 12 hours prior to his cardiac PET scan.   

## 2023-01-08 ENCOUNTER — Ambulatory Visit (HOSPITAL_COMMUNITY)
Admission: RE | Admit: 2023-01-08 | Discharge: 2023-01-08 | Disposition: A | Discharge status: Home / Self Care | Payer: Medicare Other | Source: Ambulatory Visit | Attending: Cardiovascular Disease | Admitting: Cardiovascular Disease

## 2023-01-08 DIAGNOSIS — R079 Chest pain, unspecified: Secondary | ICD-10-CM | POA: Insufficient documentation

## 2023-01-08 LAB — NM PET CT CARDIAC PERFUSION MULTI W/ABSOLUTE BLOODFLOW
MBFR: 1.23
Nuc Rest EF: 23 %
Nuc Stress EF: 21 %
Rest MBF: 0.66 ml/g/min
Rest Nuclear Isotope Dose: 30.1 mCi
ST Depression (mm): 0 mm
Stress MBF: 0.81 ml/g/min
Stress Nuclear Isotope Dose: 30 mCi

## 2023-01-08 MED ORDER — RUBIDIUM RB82 GENERATOR (RUBYFILL)
30.0500 | PACK | Freq: Once | INTRAVENOUS | Status: AC
  Administered 2023-01-08: 30.05 via INTRAVENOUS

## 2023-01-08 MED ORDER — RUBIDIUM RB82 GENERATOR (RUBYFILL)
29.9500 | PACK | Freq: Once | INTRAVENOUS | Status: AC
  Administered 2023-01-08: 29.95 via INTRAVENOUS

## 2023-01-08 MED ORDER — REGADENOSON 0.4 MG/5ML IV SOLN
0.4000 mg | Freq: Once | INTRAVENOUS | Status: AC
  Administered 2023-01-08: 0.4 mg via INTRAVENOUS

## 2023-01-08 MED ORDER — REGADENOSON 0.4 MG/5ML IV SOLN
INTRAVENOUS | Status: AC
  Filled 2023-01-08: qty 5

## 2023-01-09 NOTE — Telephone Encounter (Signed)
Faxed pt assistance application for Eliquis

## 2023-01-13 ENCOUNTER — Telehealth: Payer: Self-pay | Admitting: Cardiovascular Disease

## 2023-01-13 NOTE — Telephone Encounter (Signed)
Patient received letter needing more information.  Needed proof of income but this was dated as of the 29th of July Advised if receives anynew information dated after the 1st of August, then let us know.  He states understanding

## 2023-01-13 NOTE — Telephone Encounter (Signed)
Patient is requesting to speak with RN Natalia Leatherwood in regards to the patient assistance for Eliquis. Please advise.

## 2023-01-16 ENCOUNTER — Telehealth: Payer: Self-pay | Admitting: Cardiovascular Disease

## 2023-01-16 NOTE — Telephone Encounter (Signed)
Received correspondence that patient has been approved for Eliquis from 01/14/23 - 06/10/23  LM for patient with this update

## 2023-01-23 ENCOUNTER — Telehealth: Payer: Self-pay | Admitting: Emergency Medicine

## 2023-01-23 NOTE — Telephone Encounter (Signed)
Left message: Informed pt that the patient assistance for Jardiance was DENIED due to the patient having Prescription coverage. I called [employer] to ask about this and they said that if a patient has "any" prescription coverage, then they are denied. There is an appeals process- if you spend 10% of your income or more on this ONE drug- then you can be approved.   Left call back number.

## 2023-01-27 NOTE — Progress Notes (Signed)
No ICM remote transmission received for 01/20/2023 and next ICM transmission scheduled for 01/28/2023.

## 2023-01-28 ENCOUNTER — Ambulatory Visit: Payer: Medicare Other

## 2023-01-28 DIAGNOSIS — Z95810 Presence of automatic (implantable) cardiac defibrillator: Secondary | ICD-10-CM | POA: Diagnosis not present

## 2023-01-28 DIAGNOSIS — I5022 Chronic systolic (congestive) heart failure: Secondary | ICD-10-CM

## 2023-01-28 NOTE — Progress Notes (Unsigned)
EPIC Encounter for ICM Monitoring  Patient Name: Kevin Patel is a 77 y.o. male Date: 01/28/2023 Primary Care Physican: [name]., MD Primary Cardiologist: Croitoru Electrophysiologist: Croitoru 11/12/2022 Weight: 249 lbs                                                            Transmission reviewed.    Optivol thoracic impedance suggesting normal fluid levels within the last month.   Prescribed:  Furosemide 20 mg take 1 tablet by mouth as needed for weight gain of 3 pounds in a day or 5 pounds in a week.  Usually takes 1-2 times a week    Labs: 10/23/2022 Creatinine 1.17, BUN 12, Potassium 3.5, Sodium 142, GFR >60 A complete set of results can be found in Results Review.   Recommendations:  No changes.    Follow-up plan: Next ICM remote phone appointment 03/03/2023.    91 day device clinic remote transmission 04/30/2023.     EP/Cardiology Office Visits:  02/24/2023 with Dr Royann Shivers.    Copy of ICM check sent to Dr. Royann Shivers.   3 month ICM trend: 01/29/2023.    12-14 Month ICM trend:     Karie Soda, RN 01/28/2023 7:25 AM

## 2023-01-29 ENCOUNTER — Ambulatory Visit (INDEPENDENT_AMBULATORY_CARE_PROVIDER_SITE_OTHER): Payer: Medicare Other

## 2023-01-29 DIAGNOSIS — I255 Ischemic cardiomyopathy: Secondary | ICD-10-CM

## 2023-01-29 LAB — CUP PACEART REMOTE DEVICE CHECK
Battery Remaining Longevity: 60 mo
Battery Voltage: 2.98 V
Brady Statistic RV Percent Paced: 0 %
Date Time Interrogation Session: 20240821054345
HighPow Impedance: 61 Ohm
Implantable Lead Connection Status: 753985
Implantable Lead Implant Date: 20180905
Implantable Lead Location: 753860
Implantable Pulse Generator Implant Date: 20180905
Lead Channel Impedance Value: 342 Ohm
Lead Channel Impedance Value: 361 Ohm
Lead Channel Pacing Threshold Amplitude: 0.5 V
Lead Channel Pacing Threshold Pulse Width: 0.4 ms
Lead Channel Sensing Intrinsic Amplitude: 11.25 mV
Lead Channel Sensing Intrinsic Amplitude: 11.25 mV
Lead Channel Setting Pacing Amplitude: 2.5 V
Lead Channel Setting Pacing Pulse Width: 0.4 ms
Lead Channel Setting Sensing Sensitivity: 0.3 mV
Zone Setting Status: 755011
Zone Setting Status: 755011

## 2023-02-11 ENCOUNTER — Ambulatory Visit (HOSPITAL_BASED_OUTPATIENT_CLINIC_OR_DEPARTMENT_OTHER): Payer: Medicare Other | Admitting: Family

## 2023-02-12 ENCOUNTER — Ambulatory Visit: Payer: Medicare Other | Attending: Family | Admitting: Cardiovascular Disease

## 2023-02-12 ENCOUNTER — Encounter: Payer: Self-pay | Admitting: Cardiovascular Disease

## 2023-02-12 VITALS — BP 136/72 | HR 71 | Ht 76.0 in | Wt 245.6 lb

## 2023-02-12 DIAGNOSIS — E663 Overweight: Secondary | ICD-10-CM | POA: Diagnosis present

## 2023-02-12 DIAGNOSIS — I5042 Chronic combined systolic (congestive) and diastolic (congestive) heart failure: Secondary | ICD-10-CM

## 2023-02-12 DIAGNOSIS — I2510 Atherosclerotic heart disease of native coronary artery without angina pectoris: Secondary | ICD-10-CM | POA: Diagnosis present

## 2023-02-12 DIAGNOSIS — I480 Paroxysmal atrial fibrillation: Secondary | ICD-10-CM

## 2023-02-12 DIAGNOSIS — Z79899 Other long term (current) drug therapy: Secondary | ICD-10-CM | POA: Diagnosis present

## 2023-02-12 DIAGNOSIS — I10 Essential (primary) hypertension: Secondary | ICD-10-CM | POA: Diagnosis present

## 2023-02-12 DIAGNOSIS — E1142 Type 2 diabetes mellitus with diabetic polyneuropathy: Secondary | ICD-10-CM | POA: Diagnosis present

## 2023-02-12 DIAGNOSIS — E7800 Pure hypercholesterolemia, unspecified: Secondary | ICD-10-CM | POA: Diagnosis present

## 2023-02-12 DIAGNOSIS — I4902 Ventricular flutter: Secondary | ICD-10-CM | POA: Diagnosis present

## 2023-02-12 DIAGNOSIS — N1831 Chronic kidney disease, stage 3a: Secondary | ICD-10-CM

## 2023-02-12 DIAGNOSIS — Z5181 Encounter for therapeutic drug level monitoring: Secondary | ICD-10-CM

## 2023-02-12 DIAGNOSIS — Z95810 Presence of automatic (implantable) cardiac defibrillator: Secondary | ICD-10-CM

## 2023-02-12 MED ORDER — NITROGLYCERIN 0.4 MG SL SUBL: 0.4000 mg | SUBLINGUAL_TABLET | SUBLINGUAL | 1 refills | Status: AC | PRN

## 2023-02-12 MED ORDER — SPIRONOLACTONE 25 MG PO TABS: 25.0000 mg | ORAL_TABLET | Freq: Every day | ORAL | 3 refills | Status: DC

## 2023-02-12 NOTE — Progress Notes (Signed)
Remote ICD transmission.   

## 2023-02-12 NOTE — Patient Instructions (Signed)
Medication Instructions:  START Spironolactone 25 mg once a day *If you need a refill on your cardiac medications before your next appointment, please call your pharmacy*   Lab Work: BMP- come back in one week  If you have labs (blood work) drawn today and your tests are completely normal, you will receive your results only by: MyChart Message (if you have MyChart) OR A paper copy in the mail If you have any lab test that is abnormal or we need to change your treatment, we will call you to review the results.   Follow-Up: At [facility], you and your health needs are our priority.  As part of our continuing mission to provide you with exceptional heart care, we have created designated Provider Care Teams.  These Care Teams include your primary Cardiologist (physician) and Advanced Practice Providers (APPs -  Physician Assistants and Nurse Practitioners) who all work together to provide you with the care you need, when you need it.  We recommend signing up for the patient portal called "MyChart".  Sign up information is provided on this After Visit Summary.  MyChart is used to connect with patients for Virtual Visits (Telemedicine).  Patients are able to view lab/test results, encounter notes, upcoming appointments, etc.  Non-urgent messages can be sent to your provider as well.   To learn more about what you can do with MyChart, go to [email or web address].    Your next appointment:   6 month(s)  Provider:   [name], MD

## 2023-02-12 NOTE — Progress Notes (Signed)
Cardiology Office Note:    Date:  02/16/2023   ID:  ABDIRASHID DRENNON, DOB 1945-12-20, MRN [id number]  PCP:  [name]., MD  Cardiologist:  [name], MD   Referring MD: [name]., MD   Chief Complaint  Patient presents with   Congestive Heart Failure   Coronary Artery Disease    History of Present Illness:    Kevin Patel is a 77 y.o. male with a hx of longstanding CAD (Synergy DES 4x16 to ostial LAD 09/05/2016) and severe ischemic cardiomyopathy (25-30% by last echo 11/27/2022, 21% by PET QGS 01/08/2023), CHF (NYHA class 1-2), one episode of sustained VFlutter w appropriate ICD shock in June 2022, paroxysmal atrial fibrillation, chronic anticoagulation with Eliquis, HTN, HLP, DM . Previously reported LV apical thrombus has not been seen on his recent echos since 2012 or his recent LV angiogram. He has a single lead Medtronic VISIA AF device, MRI conditional.  The lead is a Medtronic [id number].    He is feeling better without any recent episodes of orthopnea or PND.  He does not have lower extremity edema and has NYHA functional class I-2 exertional dyspnea.  Overall feels improved.  He has not had palpitations, syncope or defibrillator discharges.  Denies dizziness or lightheadedness with changes in position.  He did have some chest pain during episode of heart failure exacerbation earlier this year.  He has not had falls, injuries or bleeding problems on Eliquis anticoagulation.  Device interrogation today shows normal function, normal parameters of the generator and leads (expected battery longevity 5 years).  He does not require ventricular pacing.  OptiVol shows normal thoracic impedance over the last several months.  He has not had any episodes of sustained or nonsustained ventricular tachycardia.  He has not had any recent atrial fibrillation and historically the burden has been low, under 1%.  PET scan performed 01/08/2023 showed extensive anteroapical scar with only mild  peri-infarct reversible ischemia.  (Incidentally noted on PET-CT 01/08/2023 was an mild superior endplate compression fraction of T12 with associated Schmorl's node, age indeterminate and a rounded 1.8 cm lesion in the upper pole of the right kidney with fluid density slightly higher than expected for simple cyst, further evaluation with ultrasound recommended).  Expected aortic and coronary artery atherosclerosis was found.     His most recent ECG shows almost complete LBBB with QRS duration of 118 ms.  He is on maximum dose Entresto and maximum dose carvedilol at low-dose Jardiance.  Not yet on aldosterone antagonist.  Metabolic control is good with a hemoglobin A1c of 6.4% and LDL cholesterol 48.  Most recent creatinine was 1.13.  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   Arthritis    knees   Chronic systolic HF (heart failure) (HCC)    Coronary artery disease    Diabetes mellitus with coincident hypertension (HCC) 09/09/2016   History of kidney stones    HTN (hypertension)    Hyperlipidemia    Ischemic cardiomyopathy    MI (myocardial infarction) (HCC) 1998   2018,   Mural thrombus of heart    on coumadin until follow up echo with contrast in Sept 13, 2012 which showed no thrombus and coumadin was d/c   Pre-diabetes     Past Surgical History:  Procedure Laterality Date   CORONARY ANGIOPLASTY  06/10/1996   PCI   CORONARY STENT INTERVENTION N/A 09/05/2016   Procedure: Coronary Stent Intervention;  Surgeon: [name],  MD;  Location: [facility] INVASIVE CV LAB;  Service: Cardiovascular;  Laterality: N/A;   CORONARY ULTRASOUND/IVUS N/A 09/05/2016   Procedure: Intravascular Ultrasound/IVUS;  Surgeon: [name], MD;  Location: [facility] INVASIVE CV LAB;  Service: Cardiovascular;  Laterality: N/A;   HERNIA REPAIR  2004   ICD IMPLANT N/A 02/12/2017   Procedure: ICD Implant;  Surgeon: [name], MD;   Location: [facility] INVASIVE CV LAB;  Service: Cardiovascular;  Laterality: N/A;   LEFT HEART CATH AND CORONARY ANGIOGRAPHY N/A 09/05/2016   Procedure: Left Heart Cath and Coronary Angiography;  Surgeon: [name], MD;  Location: [facility] INVASIVE CV LAB;  Service: Cardiovascular;  Laterality: N/A;   TOTAL KNEE ARTHROPLASTY Left 01/29/2021   Procedure: TOTAL KNEE ARTHROPLASTY;  Surgeon: Ollen Gross, MD;  Location: [facility] OR[facility];  Service: Orthopedics;  Laterality: Left;    Current Medications: Current Meds  Medication Sig   acetaminophen (TYLENOL) 325 MG tablet Take 325 mg by mouth every 4 (four) hours as needed for mild pain or moderate pain.   apixaban (ELIQUIS) 5 MG TABS tablet Take 1 tablet (5 mg total) by mouth 2 (two) times daily.   ascorbic acid (VITAMIN C) 1000 MG tablet Take 1,000 mg by mouth daily.   atorvastatin (LIPITOR) 80 MG tablet Take 1 tablet (80 mg total) by mouth daily.   carvedilol (COREG) 25 MG tablet Take 1 tablet (25 mg total) by mouth 2 (two) times daily with a meal.   Cholecalciferol 25 MCG (1000 UT) tablet Take 1,000 Units by mouth daily.   empagliflozin (JARDIANCE) 10 MG TABS tablet Take 1 tablet (10 mg total) by mouth daily before breakfast.   furosemide (LASIX) 20 MG tablet Take 1 tablet (20 mg total) by mouth daily as needed for up to 3 days for fluid or edema (Take 20 mg= 1 tablet daily as needed for weight gain of 3 pounds in a day or 5 pounds in a week).   Multiple Vitamins-Minerals (MENS 50+ MULTIVITAMIN PO) Take 1 capsule by mouth daily in the afternoon.   sacubitril-valsartan (ENTRESTO) 97-103 MG Take 1 tablet by mouth 2 (two) times daily.   spironolactone (ALDACTONE) 25 MG tablet Take 1 tablet (25 mg total) by mouth daily.     Allergies:   Patient has no known allergies.   Social History   Socioeconomic History   Marital status: Married    Spouse name: Not on file   Number of children: Not on file   Years of education: Not on file   Highest education  level: Not on file  Occupational History   Not on file  Tobacco Use   Smoking status: Former    Current packs/day: 0.00    Average packs/day: 1 pack/day for 20.0 years (20.0 ttl pk-yrs)    Types: Cigarettes    Start date: 02/12/1973    Quit date: 02/12/1993    Years since quitting: 30.0   Smokeless tobacco: Never  Vaping Use   Vaping status: Never Used  Substance and Sexual Activity   Alcohol use: No   Drug use: No   Sexual activity: Not on file  Other Topics Concern   Not on file  Social History Narrative   Not on file   Social Determinants of Health   Financial Resource Strain: Not on file  Food Insecurity: Low Risk  (12/24/2022)   Received from [facility]   Hunger Vital Sign    Worried About Running Out of Food in the Last Year: Never true  Ran Out of Food in the Last Year: Never true  Transportation Needs: Not on file (12/24/2022)  Physical Activity: Not on file  Stress: Not on file  Social Connections: Not on file     Family History: The patient's family history includes Emphysema in his father. ROS:   Please see the history of present illness.    All other systems are reviewed and are negative.  EKGs/Labs/Other Studies Reviewed:    The following studies were reviewed today: Echocardiogram 11/27/2022:   1. The mid-to-apical anterior, mid-to-apical septal, apical lateral,  apical inferior and LV apex are akinetic. No LV thrombus visualized on  definity contrast imaging. Findings consistent with prior LAD infarct..  Left ventricular ejection fraction, by  estimation, is 25 to 30%. The left ventricle has severely decreased  function. The left ventricle demonstrates regional wall motion  abnormalities (see scoring diagram/findings for description). The left  ventricular internal cavity size was severely  dilated. Left ventricular diastolic parameters are consistent with Grade  II diastolic dysfunction (pseudonormalization).   2. Right ventricular systolic  function is normal. The right ventricular  size is normal. Tricuspid regurgitation signal is inadequate for assessing  PA pressure.   3. Left atrial size was mildly dilated.   4. The mitral valve is grossly normal. Mild mitral valve regurgitation.   5. The aortic valve is tricuspid. There is mild calcification of the  aortic valve. There is mild thickening of the aortic valve. Aortic valve  regurgitation is not visualized. Aortic valve sclerosis/calcification is  present, without any evidence of  aortic stenosis.   6. Aortic dilatation noted. There is borderline dilatation of the aortic  root, measuring 39 mm.   Comparison(s): No significant change from prior study.   PET scan 01/08/2023:   LV perfusion is abnormal. There is no evidence of ischemia. There is evidence of infarction. Defect 1: There is a large defect with moderate reduction in uptake present in the apical to basal anterior and anteroseptal location(s) that is fixed. There is abnormal wall motion in the defect area. Consistent with infarction. The defect is consistent with abnormal perfusion in the LAD territory. Defect 2: There is a small defect with mild reduction in uptake present in the apical to mid anterolateral and apex location(s) that is reversible. Consistent with peri-infarct ischemia. The defect is consistent with abnormal perfusion in the LAD territory.   Rest left ventricular function is abnormal. Rest global function is severely reduced. There were multiple regional abnormalities. Rest EF: 23%. Stress left ventricular function is abnormal. Stress global function is severely reduced. There were multiple regional abnormalities. Stress EF: 21%. End diastolic cavity size is moderately enlarged. End systolic cavity size is severely enlarged.   Myocardial blood flow was computed to be 0.61ml/g/min at rest and 0.81ml/g/min at stress. Global myocardial blood flow reserve was 1.23 and was abnormal.   Findings are consistent  with infarction with peri-infarct ischemia. The study is high risk.   Large anterior, anteroseptal and anterolateral wall MI from apex to base. Small area of mild peri infarct ischemia involving the inferior apex and anterolateral apex. Abnormal global MBFR which is severely reduced in the infarct LAD territory at 1.15   Calcium not commented on due to prior stent  Comprehensive check of his ICD today.  See above  EKG: Is not ordered today, but personally reviewed the tracing from 10/24/2022.  This shows sinus rhythm with PACs, incomplete left bundle branch block QRS 118 ms and sequelae of an old anterolateral  infarction, left axis deviation.  Borderline prolonged QTc.  No meaningful changes from previous tracings.  The ECGs performed during his PET scan 01/08/2023 were unremarkable.  Recent Labs: 10/23/2022: ALT 16; Hemoglobin 13.8; Platelets 164 12/16/2022: BUN 10; Creatinine, Ser 1.08; Potassium 4.2; Sodium 144   06/21/2021 Creat 1.08, K 4.3, Hgb 14.3,  normal LFTs  12/20/2021 Potassium 4.4, glucose 128, creatinine 1.2, normal liver function tests, normal iron studies, TSH 4.69, hemoglobin 14.1, platelets 160 K Hemoglobin A1c 6.6%  Recent Lipid Panel    Component Value Date/Time   CHOL 119 07/22/2018 1104   TRIG 92 01/26/2020 2028   HDL 40 07/22/2018 1104   CHOLHDL 3.0 07/22/2018 1104   CHOLHDL 2.8 09/16/2016 0949   VLDL 22 09/16/2016 0949   LDLCALC 55 07/22/2018 1104   06/21/2021 Total cholesterol 119, triglycerides 133, HDL 45, LDL 60, A1c 6.5%  16/03/9603 Direct LDL 64, cholesterol 127, HDL 49, triglycerides 86 Physical Exam:    VS:  BP 136/72 (BP Location: Left Arm, Patient Position: Sitting, Cuff Size: Large)   Pulse 71   Ht 6\' 4"  (1.93 m)   Wt 245 lb 9.6 oz (111.4 kg)   SpO2 95%   BMI 29.90 kg/m     Wt Readings from Last 3 Encounters:  02/12/23 245 lb 9.6 oz (111.4 kg)  12/16/22 253 lb 12.8 oz (115.1 kg)  10/28/22 262 lb 3.2 oz (118.9 kg)     General: Alert,  oriented x3, no distress, healthy subclavian ICD site Head: no evidence of trauma, PERRL, EOMI, no exophtalmos or lid lag, no myxedema, no xanthelasma; normal ears, nose and oropharynx Neck: normal jugular venous pulsations and no hepatojugular reflux; brisk carotid pulses without delay and no carotid bruits Chest: clear to auscultation, no signs of consolidation by percussion or palpation, normal fremitus, symmetrical and full respiratory excursions Cardiovascular: Laterally displaced apical impulse, regular rhythm, normal first and second heart sounds, no murmurs, rubs or gallops Abdomen: no tenderness or distention, no masses by palpation, no abnormal pulsatility or arterial bruits, normal bowel sounds, no hepatosplenomegaly Extremities: no clubbing, cyanosis or edema; 2+ radial, ulnar and brachial pulses bilaterally; 2+ right femoral, posterior tibial and dorsalis pedis pulses; 2+ left femoral, posterior tibial and dorsalis pedis pulses; no subclavian or femoral bruits Neurological: grossly nonfocal Psych: Normal mood and affect      ASSESSMENT:    1. Chronic combined systolic and diastolic heart failure (HCC)   2. Encounter for monitoring diuretic therapy   3. Stage 3a chronic kidney disease (HCC)   4. Paroxysmal atrial fibrillation (HCC)   5. Ventricular flutter (HCC)   6. Coronary artery disease involving native coronary artery of native heart without angina pectoris   7. Essential hypertension   8. Controlled type 2 diabetes mellitus with diabetic polyneuropathy, without long-term current use of insulin (HCC)   9. Hypercholesterolemia   10. ICD (implantable cardioverter-defibrillator) in place   11. Overweight           PLAN:    In order of problems listed above:  CHF: Clinically euvolemic, NYHA functional class I-2, doing better since his last appointment.  Unfortunately the echo does not show any measurable improvement in LVEF and the PET scan does not show any areas  of reversible ischemia that would benefit from revascularization.  Already on maximum doses of Entresto and carvedilol as well as Jardiance.  Will start spironolactone today and repeat labs in about 3-4 weeks. Paroxysmal atrial fibrillation: Has not had any ICD detected recurrence since  August 2023.  CHA2DS2-VASc 6 (age 28, CHF, CAD, DM, HTN).  He was never aware of palpitations.  Ventricular rate control was fair on the current medication.  Overall burden of arrhythmia is quite low and antiarrhythmics are not justified.  On Eliquis.   Ventricular flutter:    No meaningful ventricular arrhythmia on current device download.  A single event occurred in June 2022 and was immediately and appropriately treated by his defibrillator.  No recurrence since then.  The trigger was never identified.  We have intensified his heart failure therapy since then. CAD: Based on the available data it is most likely that his recent chest discomfort was due to heart failure exacerbation causing subendocardial ischemia.  No reason to expect benefit from additional revascularization based on his most recent PET scan.   S/p anterior MI 1998, s/p LAD-DES 09/05/2016.  On beta-blocker.  Aspirin stopped due to to full anticoagulation.  On statin. HTN: Well-controlled, should tolerate addition of spironolactone DM: Good control with most recent hemoglobin A1c 6.4% % HLP: On statin, LDL cholesterol well within target less than 55. ICD: Normal device function.  Downloads every 3 months.  If he has additional heart failure exacerbation episodes will enroll him in the heart failure device clinic with monthly downloads. Obese: He has lost a little weight it is now in overweight category.    Medication Adjustments/Labs and Tests Ordered: Current medicines are reviewed at length with the patient today.  Concerns regarding medicines are outlined above.  Orders Placed This Encounter  Procedures   Basic metabolic panel   Patient  Instructions  Medication Instructions:  START Spironolactone 25 mg once a day *If you need a refill on your cardiac medications before your next appointment, please call your pharmacy*   Lab Work: BMP- come back in one week  If you have labs (blood work) drawn today and your tests are completely normal, you will receive your results only by: MyChart Message (if you have MyChart) OR A paper copy in the mail If you have any lab test that is abnormal or we need to change your treatment, we will call you to review the results.   Follow-Up: At [facility], you and your health needs are our priority.  As part of our continuing mission to provide you with exceptional heart care, we have created designated Provider Care Teams.  These Care Teams include your primary Cardiologist (physician) and Advanced Practice Providers (APPs -  Physician Assistants and Nurse Practitioners) who all work together to provide you with the care you need, when you need it.  We recommend signing up for the patient portal called "MyChart".  Sign up information is provided on this After Visit Summary.  MyChart is used to connect with patients for Virtual Visits (Telemedicine).  Patients are able to view lab/test results, encounter notes, upcoming appointments, etc.  Non-urgent messages can be sent to your provider as well.   To learn more about what you can do with MyChart, go to [email or web address].    Your next appointment:   6 month(s)  Provider:   [name], MD       Signed, [name], MD  02/16/2023 9:26 AM    [facility]

## 2023-02-16 ENCOUNTER — Encounter: Payer: Self-pay | Admitting: Cardiovascular Disease

## 2023-02-24 ENCOUNTER — Ambulatory Visit: Payer: Medicare Other | Admitting: Cardiovascular Disease

## 2023-02-26 ENCOUNTER — Encounter: Payer: Self-pay | Admitting: Emergency Medicine

## 2023-02-26 ENCOUNTER — Telehealth: Payer: Self-pay | Admitting: Emergency Medicine

## 2023-02-26 NOTE — Telephone Encounter (Signed)
Left a message with the information below and will also mail results to patient  Croitoru, Rachelle Hora, MD  Labs are okay.  The kidney function tests are slightly higher than previous baseline, not meaningfully so.  This commonly happens when we start spironolactone and does not mean that we need to back off the medication.  The potassium level is normal.  Continue the same meds please.

## 2023-03-03 ENCOUNTER — Ambulatory Visit: Payer: Medicare Other | Attending: Cardiovascular Disease

## 2023-03-03 DIAGNOSIS — I5042 Chronic combined systolic (congestive) and diastolic (congestive) heart failure: Secondary | ICD-10-CM | POA: Diagnosis not present

## 2023-03-03 DIAGNOSIS — Z95810 Presence of automatic (implantable) cardiac defibrillator: Secondary | ICD-10-CM | POA: Diagnosis not present

## 2023-03-05 NOTE — Progress Notes (Signed)
EPIC Encounter for ICM Monitoring  Patient Name: Kevin Patel is a 77 y.o. male Date: 03/05/2023 Primary Care Physican: [name]., MD Primary Cardiologist: Croitoru Electrophysiologist: Croitoru 11/12/2022 Weight: 249 lbs 03/03/2023 Weight: 234 lbs                                                            Spoke with patient and heart failure questions reviewed.  Transmission results reviewed.  Pt asymptomatic for fluid accumulation.  Reports feeling well at this time and voices no complaints.   He takes PRN Lasix for a couple of days when he has a weight gain.  He does not take anymore than twice a week.    Optivol thoracic impedance suggesting normal fluid levels within the last month.   Prescribed:  Furosemide 20 mg take 1 tablet by mouth as needed for weight gain of 3 pounds in a day or 5 pounds in a week.  Usually takes 1-2 times a week    Labs: 10/23/2022 Creatinine 1.17, BUN 12, Potassium 3.5, Sodium 142, GFR >60 A complete set of results can be found in Results Review.   Recommendations:  No changes and encouraged to call if experiencing any fluid symptoms.   Follow-up plan: Next ICM remote phone appointment 04/07/2023.    91 day device clinic remote transmission 04/30/2023.     EP/Cardiology Office Visits:   Recall 08/11/2023 with Dr Royann Shivers.    Copy of ICM check sent to Dr. Royann Shivers.   3 month ICM trend: 03/03/2023.    12-14 Month ICM trend:     Karie Soda, RN 03/05/2023 3:23 PM

## 2023-04-16 NOTE — Progress Notes (Signed)
No ICM remote transmission received for 04/07/2023 and next ICM transmission scheduled for 04/29/2023.

## 2023-04-29 ENCOUNTER — Ambulatory Visit: Payer: Medicare Other | Attending: Cardiovascular Disease

## 2023-04-29 DIAGNOSIS — I5042 Chronic combined systolic (congestive) and diastolic (congestive) heart failure: Secondary | ICD-10-CM

## 2023-04-29 DIAGNOSIS — Z95810 Presence of automatic (implantable) cardiac defibrillator: Secondary | ICD-10-CM | POA: Diagnosis not present

## 2023-04-30 ENCOUNTER — Ambulatory Visit (INDEPENDENT_AMBULATORY_CARE_PROVIDER_SITE_OTHER): Payer: Medicare Other

## 2023-04-30 ENCOUNTER — Telehealth: Payer: Self-pay

## 2023-04-30 DIAGNOSIS — I255 Ischemic cardiomyopathy: Secondary | ICD-10-CM | POA: Diagnosis not present

## 2023-04-30 DIAGNOSIS — I5022 Chronic systolic (congestive) heart failure: Secondary | ICD-10-CM

## 2023-04-30 NOTE — Telephone Encounter (Signed)
Remote ICM transmission received.  Attempted call to patient regarding ICM remote transmission and left message for return call

## 2023-04-30 NOTE — Progress Notes (Signed)
EPIC Encounter for ICM Monitoring  Patient Name: Kevin Patel is a 77 y.o. male Date: 04/30/2023 Primary Care Physican: [name]., MD Primary Cardiologist: Croitoru Electrophysiologist: Croitoru 11/12/2022 Weight: 249 lbs 03/03/2023 Weight: 234 lbs                                                            Attempted call to patient and unable to reach.  Left message to return call. Transmission reviewed.     Optivol thoracic impedance suggesting normal fluid levels within the last month.   Prescribed:  Furosemide 20 mg take 1 tablet by mouth as needed for weight gain of 3 pounds in a day or 5 pounds in a week.  Usually takes 1-2 times a week    Labs: 10/23/2022 Creatinine 1.17, BUN 12, Potassium 3.5, Sodium 142, GFR >60 A complete set of results can be found in Results Review.   Recommendations:  Unable to reach.     Follow-up plan: Next ICM remote phone appointment 06/02/2023.    91 day device clinic remote transmission 07/29/2023.     EP/Cardiology Office Visits:   Recall 08/11/2023 with Dr Royann Shivers.    Copy of ICM check sent to Dr. Royann Shivers.    3 month ICM trend: 04/28/2023.    12-14 Month ICM trend:     Karie Soda, RN 04/30/2023 12:25 PM

## 2023-05-01 LAB — CUP PACEART REMOTE DEVICE CHECK
Battery Remaining Longevity: 55 mo
Battery Voltage: 2.98 V
Brady Statistic RV Percent Paced: 0.01 %
Date Time Interrogation Session: 20241119031603
HighPow Impedance: 65 Ohm
Implantable Lead Connection Status: 753985
Implantable Lead Implant Date: 20180905
Implantable Lead Location: 753860
Implantable Pulse Generator Implant Date: 20180905
Lead Channel Impedance Value: 342 Ohm
Lead Channel Impedance Value: 399 Ohm
Lead Channel Pacing Threshold Amplitude: 0.5 V
Lead Channel Pacing Threshold Pulse Width: 0.4 ms
Lead Channel Sensing Intrinsic Amplitude: 12.25 mV
Lead Channel Sensing Intrinsic Amplitude: 12.25 mV
Lead Channel Setting Pacing Amplitude: 2.5 V
Lead Channel Setting Pacing Pulse Width: 0.4 ms
Lead Channel Setting Sensing Sensitivity: 0.3 mV
Zone Setting Status: 755011
Zone Setting Status: 755011

## 2023-05-02 ENCOUNTER — Telehealth: Payer: Self-pay | Admitting: Cardiovascular Disease

## 2023-05-02 NOTE — Telephone Encounter (Signed)
Paper Work Dropped Off: [place] Patient assistance  Date:05/02/23  Location of paper:  [employer] logistics/support/administrative officer

## 2023-05-05 ENCOUNTER — Telehealth: Payer: Self-pay | Admitting: Emergency Medicine

## 2023-05-05 NOTE — Telephone Encounter (Signed)
Pt assistance for Eliquis faxed

## 2023-05-27 NOTE — Progress Notes (Signed)
Remote ICD transmission.   

## 2023-06-02 ENCOUNTER — Ambulatory Visit: Payer: Medicare Other | Attending: Cardiovascular Disease

## 2023-06-02 DIAGNOSIS — I5042 Chronic combined systolic (congestive) and diastolic (congestive) heart failure: Secondary | ICD-10-CM

## 2023-06-02 DIAGNOSIS — Z95810 Presence of automatic (implantable) cardiac defibrillator: Secondary | ICD-10-CM

## 2023-06-05 NOTE — Progress Notes (Signed)
EPIC Encounter for ICM Monitoring  Patient Name: Kevin Patel is a 77 y.o. male Date: 06/05/2023 Primary Care Physican: [name]., MD Primary Cardiologist: Croitoru Electrophysiologist: Croitoru 11/12/2022 Weight: 249 lbs 03/03/2023 Weight: 234 lbs 06/05/2023 Weight: 230 lbs                                                            Spoke with patient and heart failure questions reviewed.  Transmission results reviewed.  Pt asymptomatic for fluid accumulation.  Reports feeling well at this time and voices no complaints.     Optivol thoracic impedance suggesting normal fluid levels within the last month.   Prescribed:  Furosemide 20 mg take 1 tablet by mouth as needed for weight gain of 3 pounds in a day or 5 pounds in a week.  Usually takes 1-2 times a week    Labs: 02/25/2023 Creatinine 1.32, BUN 8,   Potassium 4.2, Sodium 144, GFR 56  12/16/2022 Creatinine 1.08, BUN 10, Potassium 4.2, Sodium 144, GFR 71  A complete set of results can be found in Results Review.   Recommendations:  No changes and encouraged to call if experiencing any fluid symptoms.   Follow-up plan: Next ICM remote phone appointment 07/07/2023.    91 day device clinic remote transmission 07/29/2023.     EP/Cardiology Office Visits:   Recall 08/11/2023 with Dr Royann Shivers.    Copy of ICM check sent to Dr. Royann Shivers.    3 month ICM trend: 06/02/2023.    12-14 Month ICM trend:     Karie Soda, RN 06/05/2023 1:13 PM

## 2023-07-07 ENCOUNTER — Ambulatory Visit: Payer: Medicare Other | Attending: Cardiovascular Disease

## 2023-07-07 DIAGNOSIS — Z95810 Presence of automatic (implantable) cardiac defibrillator: Secondary | ICD-10-CM

## 2023-07-07 DIAGNOSIS — I5042 Chronic combined systolic (congestive) and diastolic (congestive) heart failure: Secondary | ICD-10-CM

## 2023-07-11 NOTE — Progress Notes (Signed)
EPIC Encounter for ICM Monitoring  Patient Name: Kevin Patel is a 78 y.o. male Date: 07/11/2023 Primary Care Physican: [name]., MD Primary Cardiologist: Croitoru Electrophysiologist: Croitoru 11/12/2022 Weight: 249 lbs 03/03/2023 Weight: 234 lbs 06/05/2023 Weight: 230 lbs                                                           Spoke with patient and heart failure questions reviewed.  Transmission results reviewed.  Pt asymptomatic for fluid accumulation.  Reports feeling well at this time and voices no complaints.     Optivol thoracic impedance suggesting normal fluid levels within the last month.   Prescribed:  Furosemide 20 mg take 1 tablet by mouth as needed for weight gain of 3 pounds in a day or 5 pounds in a week.  Usually takes 1-2 times a week    Labs: 02/25/2023 Creatinine 1.32, BUN 8,   Potassium 4.2, Sodium 144, GFR 56  12/16/2022 Creatinine 1.08, BUN 10, Potassium 4.2, Sodium 144, GFR 71  A complete set of results can be found in Results Review.   Recommendations:  No changes and encouraged to call if experiencing any fluid symptoms.   Follow-up plan: Next ICM remote phone appointment 08/11/2023.    91 day device clinic remote transmission 07/29/2023.     EP/Cardiology Office Visits:   Recall 08/11/2023 with Dr Royann Shivers.    Copy of ICM check sent to Dr. Royann Shivers.    3 month ICM trend: 07/07/2023.    12-14 Month ICM trend:     Karie Soda, RN 07/11/2023 1:21 PM

## 2023-07-29 LAB — CUP PACEART REMOTE DEVICE CHECK
Battery Remaining Longevity: 55 mo
Battery Voltage: 2.98 V
Brady Statistic RV Percent Paced: 0.01 %
Date Time Interrogation Session: 20250218044225
HighPow Impedance: 64 Ohm
Implantable Lead Connection Status: 753985
Implantable Lead Implant Date: 20180905
Implantable Lead Location: 753860
Implantable Pulse Generator Implant Date: 20180905
Lead Channel Impedance Value: 361 Ohm
Lead Channel Impedance Value: 418 Ohm
Lead Channel Pacing Threshold Amplitude: 0.625 V
Lead Channel Pacing Threshold Pulse Width: 0.4 ms
Lead Channel Sensing Intrinsic Amplitude: 16.625 mV
Lead Channel Sensing Intrinsic Amplitude: 16.625 mV
Lead Channel Setting Pacing Amplitude: 2.5 V
Lead Channel Setting Pacing Pulse Width: 0.4 ms
Lead Channel Setting Sensing Sensitivity: 0.3 mV
Zone Setting Status: 755011
Zone Setting Status: 755011

## 2023-07-30 ENCOUNTER — Encounter: Payer: Self-pay | Admitting: Cardiovascular Disease

## 2023-07-30 ENCOUNTER — Ambulatory Visit (INDEPENDENT_AMBULATORY_CARE_PROVIDER_SITE_OTHER): Payer: Medicare Other

## 2023-07-30 DIAGNOSIS — I255 Ischemic cardiomyopathy: Secondary | ICD-10-CM

## 2023-08-11 ENCOUNTER — Ambulatory Visit: Payer: Medicare Other | Attending: Cardiovascular Disease

## 2023-08-11 DIAGNOSIS — Z95810 Presence of automatic (implantable) cardiac defibrillator: Secondary | ICD-10-CM

## 2023-08-11 DIAGNOSIS — I5042 Chronic combined systolic (congestive) and diastolic (congestive) heart failure: Secondary | ICD-10-CM

## 2023-08-15 ENCOUNTER — Telehealth: Payer: Self-pay

## 2023-08-15 NOTE — Telephone Encounter (Signed)
 Remote ICM transmission received.  Attempted call to patient regarding ICM remote transmission and no answer.

## 2023-08-15 NOTE — Progress Notes (Signed)
 EPIC Encounter for ICM Monitoring  Patient Name: AIKAM VINJE is a 78 y.o. male Date: 08/15/2023 Primary Care Physican: [name]., MD Primary Cardiologist: Croitoru Electrophysiologist: Croitoru 11/12/2022 Weight: 249 lbs 03/03/2023 Weight: 234 lbs 06/05/2023 Weight: 230 lbs                                                           Attempted call to patient and unable to reach.    Transmission results reviewed.      Optivol thoracic impedance suggesting normal fluid levels within the last month.   Prescribed:  Furosemide 20 mg take 1 tablet by mouth as needed for weight gain of 3 pounds in a day or 5 pounds in a week.  Usually takes 1-2 times a week    Labs: 02/25/2023 Creatinine 1.32, BUN 8,   Potassium 4.2, Sodium 144, GFR 56  12/16/2022 Creatinine 1.08, BUN 10, Potassium 4.2, Sodium 144, GFR 71  A complete set of results can be found in Results Review.   Recommendations: Unable to reach.     Follow-up plan: Next ICM remote phone appointment 09/15/2023.    91 day device clinic remote transmission 10/29/2023.     EP/Cardiology Office Visits:   [id number] with Dr Royann Shivers.    Copy of ICM check sent to Dr. Royann Shivers.    3 month ICM trend: 08/11/2023.    12-14 Month ICM trend:     Karie Soda, RN 08/15/2023 1:01 PM

## 2023-08-18 ENCOUNTER — Other Ambulatory Visit: Payer: Self-pay | Admitting: Cardiovascular Disease

## 2023-08-24 ENCOUNTER — Other Ambulatory Visit: Payer: Self-pay | Admitting: Cardiovascular Disease

## 2023-08-24 DIAGNOSIS — I5022 Chronic systolic (congestive) heart failure: Secondary | ICD-10-CM

## 2023-08-24 DIAGNOSIS — I255 Ischemic cardiomyopathy: Secondary | ICD-10-CM

## 2023-09-03 NOTE — Progress Notes (Signed)
 Remote ICD transmission.

## 2023-09-15 ENCOUNTER — Encounter: Payer: Self-pay | Admitting: Cardiovascular Disease

## 2023-09-15 ENCOUNTER — Ambulatory Visit: Payer: Medicare Other | Attending: Cardiovascular Disease | Admitting: Cardiovascular Disease

## 2023-09-15 ENCOUNTER — Ambulatory Visit

## 2023-09-15 VITALS — BP 112/70 | HR 56 | Ht 76.0 in | Wt 242.6 lb

## 2023-09-15 DIAGNOSIS — E1142 Type 2 diabetes mellitus with diabetic polyneuropathy: Secondary | ICD-10-CM | POA: Diagnosis present

## 2023-09-15 DIAGNOSIS — I4902 Ventricular flutter: Secondary | ICD-10-CM | POA: Diagnosis not present

## 2023-09-15 DIAGNOSIS — I2510 Atherosclerotic heart disease of native coronary artery without angina pectoris: Secondary | ICD-10-CM | POA: Diagnosis not present

## 2023-09-15 DIAGNOSIS — I5042 Chronic combined systolic (congestive) and diastolic (congestive) heart failure: Secondary | ICD-10-CM | POA: Diagnosis not present

## 2023-09-15 DIAGNOSIS — I480 Paroxysmal atrial fibrillation: Secondary | ICD-10-CM

## 2023-09-15 DIAGNOSIS — I10 Essential (primary) hypertension: Secondary | ICD-10-CM

## 2023-09-15 DIAGNOSIS — Z95810 Presence of automatic (implantable) cardiac defibrillator: Secondary | ICD-10-CM

## 2023-09-15 DIAGNOSIS — E7800 Pure hypercholesterolemia, unspecified: Secondary | ICD-10-CM

## 2023-09-15 DIAGNOSIS — I454 Nonspecific intraventricular block: Secondary | ICD-10-CM | POA: Diagnosis present

## 2023-09-15 NOTE — Progress Notes (Unsigned)
 Cardiology Office Note:    Date:  09/16/2023   ID:  Kevin Patel, DOB 1945-09-22, MRN [id number]  PCP:  Kevin Patel., MD  Cardiologist:  Kevin Fair, MD   Referring MD: Kevin Patel., MD   Chief Complaint  Patient presents with   Congestive Heart Failure    History of Present Illness:    Kevin Patel is a 78 y.o. male with a hx of longstanding CAD (Synergy DES 4x16 to ostial LAD 09/05/2016) and severe ischemic cardiomyopathy (25-30% by last echo 11/27/2022, 21% by PET QGS 01/08/2023), CHF (NYHA class 1-2), one episode of sustained VFlutter w appropriate ICD shock in June 2022, paroxysmal atrial fibrillation, chronic anticoagulation with Eliquis, HTN, HLP, DM . Previously reported LV apical thrombus has not been seen on his recent echos since 2012 or his recent LV angiogram. He has a single lead Medtronic VISIA AF device, MRI conditional.  The lead is a Medtronic [id number].    The patient specifically denies any chest pain at rest or with exertion, dyspnea at rest or with exertion, orthopnea, paroxysmal nocturnal dyspnea, syncope, palpitations, focal neurological deficits, intermittent claudication, lower extremity edema, unexplained weight gain, cough, hemoptysis or wheezing. He has not had falls, injuries or bleeding problems on Eliquis anticoagulation.  Metabolic control was good with a hemoglobin A1c of 6.3% and LDL of 51.  Creatinine is better than at his previous assessment, down to 1.17.  ICD interrogation shows normal device function.  Presenting rhythm is ventricular sensed (single-lead device).  Estimated gentle Ingevity is 4.3 years.  All lead parameters are stable and in normal range.  He has not required any ventricular pacing.  There has been no detected atrial fibrillation he has only had 2 very brief episodes of nonsustained ventricular tachycardia in July 2024 in October 2024, none since then.  Activities stable at roughly 1.2 hours/day.  His OptiVol has been in normal  range over the last 9 months.  His ECG shows worsening ventricular conduction abnormality with a QRS duration that is now 144 ms, but it is not a typical LBBB.  PET scan performed 01/08/2023 showed extensive anteroapical scar with only mild peri-infarct reversible ischemia.  (Incidentally noted on PET-CT 01/08/2023 was an mild superior endplate compression fraction of T12 with associated Schmorl's node, age indeterminate and a rounded 1.8 cm lesion in the upper pole of the right kidney with fluid density slightly higher than expected for simple cyst, further evaluation with ultrasound recommended).  Expected aortic and coronary artery atherosclerosis was found.       He is on maximum dose Entresto and maximum dose carvedilol and low-dose Jardiance.  Not yet on aldosterone antagonist.  Metabolic control is good with a hemoglobin A1c of 6.4% and LDL cholesterol 48.  Most recent creatinine was 1.13.  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   Arthritis    knees   Chronic systolic HF (heart failure) (HCC)    Coronary artery disease    Diabetes mellitus with coincident hypertension (HCC) 09/09/2016   History of kidney stones    HTN (hypertension)    Hyperlipidemia    Ischemic cardiomyopathy    MI (myocardial infarction) (HCC) 1998   2018,   Mural thrombus of heart    on coumadin until follow up echo with contrast in Sept 13, 2012 which showed no thrombus and coumadin was d/c   Pre-diabetes     Past Surgical History:  Procedure Laterality Date  CORONARY ANGIOPLASTY  06/10/1996   PCI   CORONARY STENT INTERVENTION N/A 09/05/2016   Procedure: Coronary Stent Intervention;  Surgeon: Kevin Hazel, MD;  Location: [facility] INVASIVE CV LAB;  Service: Cardiovascular;  Laterality: N/A;   CORONARY ULTRASOUND/IVUS N/A 09/05/2016   Procedure: Intravascular Ultrasound/IVUS;  Surgeon: Kevin Hazel, MD;  Location: [facility] INVASIVE  CV LAB;  Service: Cardiovascular;  Laterality: N/A;   HERNIA REPAIR  2004   ICD IMPLANT N/A 02/12/2017   Procedure: ICD Implant;  Surgeon: Kevin Fair, MD;  Location: [facility] INVASIVE CV LAB;  Service: Cardiovascular;  Laterality: N/A;   LEFT HEART CATH AND CORONARY ANGIOGRAPHY N/A 09/05/2016   Procedure: Left Heart Cath and Coronary Angiography;  Surgeon: Kevin Hazel, MD;  Location: [facility] INVASIVE CV LAB;  Service: Cardiovascular;  Laterality: N/A;   TOTAL KNEE ARTHROPLASTY Left 01/29/2021   Procedure: TOTAL KNEE ARTHROPLASTY;  Surgeon: Kevin Gross, MD;  Location: [facility] ORS;  Service: Orthopedics;  Laterality: Left;    Current Medications: Current Meds  Medication Sig   apixaban (ELIQUIS) 5 MG TABS tablet Take 1 tablet (5 mg total) by mouth 2 (two) times daily.   ascorbic acid (VITAMIN C) 1000 MG tablet Take 1,000 mg by mouth daily.   atorvastatin (LIPITOR) 80 MG tablet Take 1 tablet by mouth once daily   carvedilol (COREG) 25 MG tablet TAKE 1 TABLET BY MOUTH TWICE DAILY WITH A MEAL   Cholecalciferol 25 MCG (1000 UT) tablet Take 1,000 Units by mouth daily.   empagliflozin (JARDIANCE) 10 MG TABS tablet TAKE 1 TABLET BY MOUTH ONCE DAILY BEFORE BREAKFAST   ENTRESTO 97-103 MG Take 1 tablet by mouth twice daily   Multiple Vitamins-Minerals (MENS 50+ MULTIVITAMIN PO) Take 1 capsule by mouth daily in the afternoon.   spironolactone (ALDACTONE) 25 MG tablet Take 1 tablet (25 mg total) by mouth daily.     Allergies:   Patient has no known allergies.    Family History: The patient's family history includes Emphysema in his father. ROS:   Please see the history of present illness.    All other systems are reviewed and are negative.  EKGs/Labs/Other Studies Reviewed:    The following studies were reviewed today: Echocardiogram 11/27/2022:   1. The mid-to-apical anterior, mid-to-apical septal, apical lateral,  apical inferior and LV apex are akinetic. No LV thrombus visualized on   definity contrast imaging. Findings consistent with prior LAD infarct..  Left ventricular ejection fraction, by  estimation, is 25 to 30%. The left ventricle has severely decreased  function. The left ventricle demonstrates regional wall motion  abnormalities (see scoring diagram/findings for description). The left  ventricular internal cavity size was severely  dilated. Left ventricular diastolic parameters are consistent with Grade  II diastolic dysfunction (pseudonormalization).   2. Right ventricular systolic function is normal. The right ventricular  size is normal. Tricuspid regurgitation signal is inadequate for assessing  PA pressure.   3. Left atrial size was mildly dilated.   4. The mitral valve is grossly normal. Mild mitral valve regurgitation.   5. The aortic valve is tricuspid. There is mild calcification of the  aortic valve. There is mild thickening of the aortic valve. Aortic valve  regurgitation is not visualized. Aortic valve sclerosis/calcification is  present, without any evidence of  aortic stenosis.   6. Aortic dilatation noted. There is borderline dilatation of the aortic  root, measuring 39 mm.   Comparison(s): No significant change from prior study.   PET scan 01/08/2023:  LV perfusion is abnormal. There is no evidence of ischemia. There is evidence of infarction. Defect 1: There is a large defect with moderate reduction in uptake present in the apical to basal anterior and anteroseptal location(s) that is fixed. There is abnormal wall motion in the defect area. Consistent with infarction. The defect is consistent with abnormal perfusion in the LAD territory. Defect 2: There is a small defect with mild reduction in uptake present in the apical to mid anterolateral and apex location(s) that is reversible. Consistent with peri-infarct ischemia. The defect is consistent with abnormal perfusion in the LAD territory.   Rest left ventricular function is abnormal. Rest  global function is severely reduced. There were multiple regional abnormalities. Rest EF: 23%. Stress left ventricular function is abnormal. Stress global function is severely reduced. There were multiple regional abnormalities. Stress EF: 21%. End diastolic cavity size is moderately enlarged. End systolic cavity size is severely enlarged.   Myocardial blood flow was computed to be 0.14ml/g/min at rest and 0.61ml/g/min at stress. Global myocardial blood flow reserve was 1.23 and was abnormal.   Findings are consistent with infarction with peri-infarct ischemia. The study is high risk.   Large anterior, anteroseptal and anterolateral wall MI from apex to base. Small area of mild peri infarct ischemia involving the inferior apex and anterolateral apex. Abnormal global MBFR which is severely reduced in the infarct LAD territory at 1.15   Calcium not commented on due to prior stent  Comprehensive check of his ICD today.  See above  EKG:   EKG Interpretation Date/Time:  Monday September 15 2023 13:43:26 EDT Ventricular Rate:  56 PR Interval:  176 QRS Duration:  144 QT Interval:  442 QTC Calculation: 426 R Axis:   -41  Text Interpretation: Sinus bradycardia with frequent Premature ventricular complexes Left axis deviation Left bundle branch block When compared with ECG of 23-Oct-2022 11:04, No significant change since last tracing Confirmed by Wandy Bossler [phone]) on 09/15/2023 3:21:27 PM        Recent Labs: 10/23/2022: ALT 16; Hemoglobin 13.8; Platelets 164 02/25/2023: BUN 8; Creatinine, Ser 1.32; Potassium 4.2; Sodium 144    07/21/2023 Hgb 15.6, creat 1.17, potassium 4.3, A1c 6.3%, TSH 3.608   Recent Lipid Panel    Component Value Date/Time   CHOL 119 07/22/2018 1104   TRIG 92 01/26/2020 2028   HDL 40 07/22/2018 1104   CHOLHDL 3.0 07/22/2018 1104   CHOLHDL 2.8 09/16/2016 0949   VLDL 22 09/16/2016 0949   LDLCALC 55 07/22/2018 1104   07/21/2023 Cholesterol 120, HDL 48, LDL 51,  triglycerides 120  Physical Exam:    VS:  BP 112/70 (BP Location: Left Arm, Patient Position: Sitting, Cuff Size: Normal)   Pulse (!) 56   Ht 6\' 4"  (1.93 m)   Wt 242 lb 9.6 oz (110 kg)   SpO2 96%   BMI 29.53 kg/m     Wt Readings from Last 3 Encounters:  09/15/23 242 lb 9.6 oz (110 kg)  02/12/23 245 lb 9.6 oz (111.4 kg)  12/16/22 253 lb 12.8 oz (115.1 kg)     General: Alert, oriented x3, no distress, healthy subclavian ICD site. Head: no evidence of trauma, PERRL, EOMI, no exophtalmos or lid lag, no myxedema, no xanthelasma; normal ears, nose and oropharynx Neck: normal jugular venous pulsations and no hepatojugular reflux; brisk carotid pulses without delay and no carotid bruits Chest: clear to auscultation, no signs of consolidation by percussion or palpation, normal fremitus, symmetrical and full respiratory excursions  Cardiovascular: normal position and quality of the apical impulse, regular rhythm, normal first and second heart sounds, no murmurs, rubs or gallops Abdomen: no tenderness or distention, no masses by palpation, no abnormal pulsatility or arterial bruits, normal bowel sounds, no hepatosplenomegaly Extremities: no clubbing, cyanosis or edema; 2+ radial, ulnar and brachial pulses bilaterally; 2+ right femoral, posterior tibial and dorsalis pedis pulses; 2+ left femoral, posterior tibial and dorsalis pedis pulses; no subclavian or femoral bruits Neurological: grossly nonfocal Psych: Normal mood and affect   ASSESSMENT:    1. Chronic combined systolic and diastolic heart failure (HCC)   2. Paroxysmal atrial fibrillation (HCC)   3. Ventricular flutter (HCC)   4. Coronary artery disease involving native coronary artery of native heart without angina pectoris   5. Essential hypertension   6. Controlled type 2 diabetes mellitus with diabetic polyneuropathy, without long-term current use of insulin (HCC)   7. Hypercholesterolemia   8. ICD (implantable  cardioverter-defibrillator) in place     PLAN:    In order of problems listed above:  CHF: NYHA functional class I and clinically euvolemic.  Occasionally takes an extra dose of furosemide.  His OptiVol has not exceeded threshold in the last 9 months.  Unfortunately the echo does not show any measurable improvement in LVEF and the PET scan does not show any areas of reversible ischemia that would benefit from revascularization.  On maximum doses of Entresto and carvedilol as well as Jardiance and spironolactone Paroxysmal atrial fibrillation: none detected since August 2023.  CHA2DS2-VASc 6 (age 37, CHF, CAD, DM, HTN).  He was never aware of palpitations.  Ventricular rate control was Patel on the current medication.  Overall burden of arrhythmia is quite low and antiarrhythmics are not justified.  On Eliquis.   Ventricular flutter:    Last episode of ventricular tachycardia was recorded in October 2024.  Has only received treatment for ventricular tachyarrhythmia in June 2022 appropriately treated by his defibrillator.  No recurrence since then.   CAD: No recent angina. No reason to expect benefit from additional revascularization based on his most recent PET scan.   S/p anterior MI 1998, s/p LAD-DES 09/05/2016.  On beta-blocker.  Aspirin stopped due to to full anticoagulation.  On statin. Intraventricular conduction delay: His QRS is much broader now, but it is unlikely he would benefit from CRT (not typical LBBB, ischemic cardiomyopathy, QRS less than 150 ms, male gender). HTN: Well-controlled, should tolerate addition of spironolactone DM: very well controlled 6.3% HLP: LDL is <55, continue statin ICD: Normal device function.  Downloads every 3 months.  If he has additional heart failure exacerbation episodes will enroll him in the heart failure device clinic with monthly downloads. Overweight: no longer obese    Medication Adjustments/Labs and Tests Ordered: Current medicines are reviewed at  length with the patient today.  Concerns regarding medicines are outlined above.  Orders Placed This Encounter  Procedures   EKG 12-Lead   Patient Instructions  Medication Instructions:  No changes *If you need a refill on your cardiac medications before your next appointment, please call your pharmacy*  Follow-Up: At [facility], you and your health needs are our priority.  As part of our continuing mission to provide you with exceptional heart care, our providers are all part of one team.  This team includes your primary Cardiologist (physician) and Advanced Practice Providers or APPs (Physician Assistants and Nurse Practitioners) who all work together to provide you with the care you need, when you need  it.  Your next appointment:   1 year(s)- Pacer check  Provider:   [name], MD     We recommend signing up for the patient portal called "MyChart".  Sign up information is provided on this After Visit Summary.  MyChart is used to connect with patients for Virtual Visits (Telemedicine).  Patients are able to view lab/test results, encounter notes, upcoming appointments, etc.  Non-urgent messages can be sent to your provider as well.   To learn more about what you can do with MyChart, go to [email or web address].        1st Floor: - Lobby - Registration  - Pharmacy  - Lab - Cafe  2nd Floor: - PV Lab - Diagnostic Testing (echo, CT, nuclear med)  3rd Floor: - Vacant  4th Floor: - TCTS (cardiothoracic surgery) - AFib Clinic - Structural Heart Clinic - Vascular Surgery  - Vascular Ultrasound  5th Floor: - HeartCare Cardiology (general and EP) - Clinical Pharmacy for coumadin, hypertension, lipid, weight-loss medications, and med management appointments    Valet parking services will be available as well.      Signed, Kevin Fair, MD  09/16/2023 2:47 PM    [facility]

## 2023-09-15 NOTE — Patient Instructions (Signed)
 Medication Instructions:  No changes *If you need a refill on your cardiac medications before your next appointment, please call your pharmacy*  Follow-Up: At [facility], you and your health needs are our priority.  As part of our continuing mission to provide you with exceptional heart care, our providers are all part of one team.  This team includes your primary Cardiologist (physician) and Advanced Practice Providers or APPs (Physician Assistants and Nurse Practitioners) who all work together to provide you with the care you need, when you need it.  Your next appointment:   1 year(s)- Pacer check  Provider:   [name], MD     We recommend signing up for the patient portal called "MyChart".  Sign up information is provided on this After Visit Summary.  MyChart is used to connect with patients for Virtual Visits (Telemedicine).  Patients are able to view lab/test results, encounter notes, upcoming appointments, etc.  Non-urgent messages can be sent to your provider as well.   To learn more about what you can do with MyChart, go to [email or web address].        1st Floor: - Lobby - Registration  - Pharmacy  - Lab - Cafe  2nd Floor: - PV Lab - Diagnostic Testing (echo, CT, nuclear med)  3rd Floor: - Vacant  4th Floor: - TCTS (cardiothoracic surgery) - AFib Clinic - Structural Heart Clinic - Vascular Surgery  - Vascular Ultrasound  5th Floor: - HeartCare Cardiology (general and EP) - Clinical Pharmacy for coumadin, hypertension, lipid, weight-loss medications, and med management appointments    Valet parking services will be available as well.

## 2023-09-18 NOTE — Progress Notes (Signed)
 EPIC Encounter for ICM Monitoring  Patient Name: [name] is a 78 y.o. male Date: 09/18/2023 Primary Care Physican: [name]., MD Primary Cardiologist: Croitoru Electrophysiologist: Croitoru 11/12/2022 Weight: 249 lbs 03/03/2023 Weight: 234 lbs 06/05/2023 Weight: 230 lbs                                                           Transmission results reviewed.      Optivol thoracic impedance suggesting intermittent days with possible fluid accumulation occurring between 3/17-3/31.   Prescribed:  Furosemide 20 mg take 1 tablet by mouth as needed for up to 3 days for fluid or edema (Take 20 mg= 1 tablet daily as needed for weight gain of 3 pounds in a day or 5 pounds in a week.     Labs: 07/16/2023 Creatinine 1.17, BUN 16, Potassium 4.3, Sodium 140, GFR 64 A complete set of results can be found in Results Review.   Recommendations:  Any recommendations were given at 4/7 OV with Dr Royann Shivers.   Follow-up plan: Next ICM remote phone appointment 10/20/2023.    91 day device clinic remote transmission 10/29/2023.     EP/Cardiology Office Visits:     Recall 09/14/2024 with Dr Royann Shivers.   Copy of ICM check sent to Dr. Royann Shivers.    3 month ICM trend:      12-14 Month ICM trend:     Karie Soda, RN 09/18/2023 1:31 PM

## 2023-10-20 ENCOUNTER — Ambulatory Visit: Attending: Cardiovascular Disease

## 2023-10-20 DIAGNOSIS — Z95810 Presence of automatic (implantable) cardiac defibrillator: Secondary | ICD-10-CM | POA: Diagnosis not present

## 2023-10-20 DIAGNOSIS — I5042 Chronic combined systolic (congestive) and diastolic (congestive) heart failure: Secondary | ICD-10-CM

## 2023-10-20 NOTE — Progress Notes (Unsigned)
 EPIC Encounter for ICM Monitoring  Patient Name: Kevin Patel is a 78 y.o. male Date: 10/20/2023 Primary Care Physican: Abbe Hoard., MD Primary Cardiologist: Croitoru Electrophysiologist: Croitoru 11/12/2022 Weight: 249 lbs 03/03/2023 Weight: 234 lbs 06/05/2023 Weight: 230 lbs 09/15/2023 Office Weight: 242 lbs                                                           Spoke with patient and heart failure questions reviewed.  Transmission results reviewed.  Pt asymptomatic for fluid accumulation.  Reports feeling well at this time and voices no complaints.     Optivol thoracic impedance trending slightly below baseline normal suggesting possible fluid accumulation occurring between 4/19 and 5/12.   Prescribed:  Furosemide  20 mg take 1 tablet by mouth as needed for up to 3 days for fluid or edema (Take 20 mg= 1 tablet daily as needed for weight gain of 3 pounds in a day or 5 pounds in a week.  Per 09/15/2023 OV note, Dr Alvis Ba is aware he takes extra when needed.   Labs: 07/16/2023 Creatinine 1.17, BUN 16, Potassium 4.3, Sodium 140, GFR 64 A complete set of results can be found in Results Review.   Recommendations:  He will take 1 PRN Lasix  tomorrow but is feeling well at this time.     Follow-up plan: Next ICM remote phone appointment 11/24/2023.    91 day device clinic remote transmission 10/29/2023.     EP/Cardiology Office Visits:     Recall 09/14/2024 with Dr Alvis Ba.   Copy of ICM check sent to Dr. Alvis Ba.    3 month ICM trend: 10/20/2023.    12-14 Month ICM trend:     Kevin Jain, RN 10/20/2023 5:10 PM

## 2023-10-21 NOTE — Progress Notes (Signed)
  Received: Today Croitoru, Mihai, MD  Dheeraj Hail, Myrtie Atkinson, RN Sounds good, thank you

## 2023-10-29 ENCOUNTER — Ambulatory Visit (INDEPENDENT_AMBULATORY_CARE_PROVIDER_SITE_OTHER): Payer: Medicare Other

## 2023-10-29 DIAGNOSIS — I255 Ischemic cardiomyopathy: Secondary | ICD-10-CM | POA: Diagnosis not present

## 2023-10-29 LAB — CUP PACEART REMOTE DEVICE CHECK
Battery Remaining Longevity: 50 mo
Battery Voltage: 2.98 V
Brady Statistic RV Percent Paced: 0.01 %
Date Time Interrogation Session: 20250521002203
HighPow Impedance: 65 Ohm
Implantable Lead Connection Status: 753985
Implantable Lead Implant Date: 20180905
Implantable Lead Location: 753860
Implantable Pulse Generator Implant Date: 20180905
Lead Channel Impedance Value: 399 Ohm
Lead Channel Impedance Value: 475 Ohm
Lead Channel Pacing Threshold Amplitude: 0.5 V
Lead Channel Pacing Threshold Pulse Width: 0.4 ms
Lead Channel Sensing Intrinsic Amplitude: 9.75 mV
Lead Channel Sensing Intrinsic Amplitude: 9.75 mV
Lead Channel Setting Pacing Amplitude: 2.5 V
Lead Channel Setting Pacing Pulse Width: 0.4 ms
Lead Channel Setting Sensing Sensitivity: 0.3 mV
Zone Setting Status: 755011
Zone Setting Status: 755011

## 2023-11-03 ENCOUNTER — Ambulatory Visit: Payer: Self-pay | Admitting: Cardiovascular Disease

## 2023-11-16 ENCOUNTER — Other Ambulatory Visit: Payer: Self-pay | Admitting: Cardiovascular Disease

## 2023-11-20 ENCOUNTER — Other Ambulatory Visit: Payer: Self-pay | Admitting: Cardiovascular Disease

## 2023-11-24 ENCOUNTER — Telehealth: Payer: Self-pay

## 2023-11-24 ENCOUNTER — Ambulatory Visit: Attending: Cardiovascular Disease

## 2023-11-24 DIAGNOSIS — Z95810 Presence of automatic (implantable) cardiac defibrillator: Secondary | ICD-10-CM

## 2023-11-24 DIAGNOSIS — I5042 Chronic combined systolic (congestive) and diastolic (congestive) heart failure: Secondary | ICD-10-CM | POA: Diagnosis not present

## 2023-11-24 NOTE — Progress Notes (Signed)
 EPIC Encounter for ICM Monitoring  Patient Name: Kevin Patel is a 78 y.o. male Date: 11/24/2023 Primary Care Physican: Abbe Hoard., MD Primary Cardiologist: Croitoru Electrophysiologist: Croitoru 11/12/2022 Weight: 249 lbs 03/03/2023 Weight: 234 lbs 06/05/2023 Weight: 230 lbs 09/15/2023 Office Weight: 242 lbs                                                           Attempted call to patient and unable to reach.   Transmission results reviewed.    Optivol thoracic impedance trending close to baseline.   Prescribed:  Furosemide  20 mg take 1 tablet by mouth as needed for up to 3 days for fluid or edema (Take 20 mg= 1 tablet daily as needed for weight gain of 3 pounds in a day or 5 pounds in a week.  Per 09/15/2023 OV note, Dr Alvis Ba is aware he takes extra when needed.   Labs: 07/16/2023 Creatinine 1.17, BUN 16, Potassium 4.3, Sodium 140, GFR 64 A complete set of results can be found in Results Review.   Recommendations:  Unable to reach.     Follow-up plan: Next ICM remote phone appointment 01/12/2024.    91 day device clinic remote transmission 01/28/2024.     EP/Cardiology Office Visits:     Recall 09/14/2024 with Dr Alvis Ba.   Copy of ICM check sent to Dr. Alvis Ba.    3 month ICM trend: 11/24/2023.    12-14 Month ICM trend:     Kevin Jain, RN 11/24/2023 2:58 PM

## 2023-11-24 NOTE — Telephone Encounter (Signed)
 Remote ICM transmission received.  Attempted call to patient regarding ICM remote transmission and no answer.

## 2023-12-18 NOTE — Progress Notes (Signed)
 Remote ICD transmission.

## 2024-01-12 ENCOUNTER — Ambulatory Visit: Attending: Cardiovascular Disease

## 2024-01-12 ENCOUNTER — Telehealth: Payer: Self-pay | Admitting: Cardiovascular Disease

## 2024-01-12 ENCOUNTER — Other Ambulatory Visit: Payer: Self-pay

## 2024-01-12 DIAGNOSIS — I5042 Chronic combined systolic (congestive) and diastolic (congestive) heart failure: Secondary | ICD-10-CM | POA: Diagnosis not present

## 2024-01-12 DIAGNOSIS — I5022 Chronic systolic (congestive) heart failure: Secondary | ICD-10-CM

## 2024-01-12 DIAGNOSIS — Z95810 Presence of automatic (implantable) cardiac defibrillator: Secondary | ICD-10-CM

## 2024-01-12 DIAGNOSIS — I255 Ischemic cardiomyopathy: Secondary | ICD-10-CM

## 2024-01-12 MED ORDER — APIXABAN 5 MG PO TABS: 5.0000 mg | ORAL_TABLET | Freq: Two times a day (BID) | ORAL | 1 refills | Status: DC

## 2024-01-12 NOTE — Telephone Encounter (Signed)
*  STAT* If patient is at the pharmacy, call can be transferred to refill team.   1. Which medications need to be refilled? (please list name of each medication and dose if known) apixaban  (ELIQUIS ) 5 MG TABS tablet    2. Would you like to learn more about the convenience, safety, & potential cost savings by using the [facility]? No   3. Are you open to using the [facility] (Type [facility]. ) No   4. Which pharmacy/location (including street and city if local pharmacy) is medication to be sent to?  [facility] [address] [place] - [address]  Phone: [phone]          5. Do they need a 30 day or 90 day supply? 90 day

## 2024-01-12 NOTE — Telephone Encounter (Signed)
 Prescription refill request for Eliquis  received. Indication:afib Last office visit:4/25 Scr:1.17  2/25 Age: 78 Weight:110  kg  Prescription refilled

## 2024-01-16 NOTE — Progress Notes (Signed)
 EPIC Encounter for ICM Monitoring  Patient Name: Kevin Patel is a 78 y.o. male Date: 01/16/2024 Primary Care Physican: Thurmond Cathlyn LABOR., MD Primary Cardiologist: Croitoru Electrophysiologist: Croitoru 11/12/2022 Weight: 249 lbs 03/03/2023 Weight: 234 lbs 06/05/2023 Weight: 230 lbs 09/15/2023 Office Weight: 242 lbs    01/14/2024 Office Weight: 250 lbs      OBSERVATIONS   Possible OptiVol fluid accumulation: 27-Oct-2023 -- 05-Dec-2023.  Patient Activity less than 1 hr/day for 2 weeks.                                                    Transmission results reviewed.    Optivol thoracic impedance suggesting normal fluid levels within the last month.   Prescribed:  Furosemide  20 mg take 1 tablet by mouth as needed for up to 3 days for fluid or edema (Take 20 mg= 1 tablet daily as needed for weight gain of 3 pounds in a day or 5 pounds in a week.  Per 09/15/2023 OV note, Dr Francyne is aware he takes extra when needed.   Labs: 01/14/2024 Creatinine 1.16, BUN 16, Potassium 4.1, Sodium 141, GFR 65 07/16/2023 Creatinine 1.17, BUN 16, Potassium 4.3, Sodium 140, GFR 64 A complete set of results can be found in Results Review.   Recommendations:  No changes.    Follow-up plan: Next ICM remote phone appointment 02/16/2024.    91 day device clinic remote transmission 01/28/2024.     EP/Cardiology Office Visits:  Recall 09/14/2024 with Dr Francyne.   Copy of ICM check sent to Dr. Francyne.    3 month ICM trend: 01/12/2024.    12-14 Month ICM trend:     Kevin GORMAN Garner, RN 01/16/2024 2:00 PM

## 2024-01-28 ENCOUNTER — Ambulatory Visit (INDEPENDENT_AMBULATORY_CARE_PROVIDER_SITE_OTHER): Payer: Medicare Other

## 2024-01-28 DIAGNOSIS — I255 Ischemic cardiomyopathy: Secondary | ICD-10-CM

## 2024-01-29 ENCOUNTER — Other Ambulatory Visit: Payer: Self-pay | Admitting: Cardiovascular Disease

## 2024-01-29 LAB — CUP PACEART REMOTE DEVICE CHECK
Battery Remaining Longevity: 48 mo
Battery Voltage: 2.98 V
Brady Statistic RV Percent Paced: 0.01 %
Date Time Interrogation Session: 20250820033322
HighPow Impedance: 60 Ohm
Implantable Lead Connection Status: 753985
Implantable Lead Implant Date: 20180905
Implantable Lead Location: 753860
Implantable Pulse Generator Implant Date: 20180905
Lead Channel Impedance Value: 304 Ohm
Lead Channel Impedance Value: 399 Ohm
Lead Channel Pacing Threshold Amplitude: 0.75 V
Lead Channel Pacing Threshold Pulse Width: 0.4 ms
Lead Channel Sensing Intrinsic Amplitude: 10.625 mV
Lead Channel Sensing Intrinsic Amplitude: 10.625 mV
Lead Channel Setting Pacing Amplitude: 2.5 V
Lead Channel Setting Pacing Pulse Width: 0.4 ms
Lead Channel Setting Sensing Sensitivity: 0.3 mV
Zone Setting Status: 755011
Zone Setting Status: 755011

## 2024-02-01 ENCOUNTER — Ambulatory Visit: Payer: Self-pay | Admitting: Cardiovascular Disease

## 2024-02-16 ENCOUNTER — Ambulatory Visit: Attending: Cardiovascular Disease

## 2024-02-16 DIAGNOSIS — Z95810 Presence of automatic (implantable) cardiac defibrillator: Secondary | ICD-10-CM

## 2024-02-16 DIAGNOSIS — I5022 Chronic systolic (congestive) heart failure: Secondary | ICD-10-CM

## 2024-02-20 NOTE — Progress Notes (Signed)
 EPIC Encounter for ICM Monitoring  Patient Name: Kevin Patel is a 78 y.o. male Date: 02/20/2024 Primary Care Physican: Thurmond Cathlyn LABOR., MD Primary Cardiologist: Croitoru Electrophysiologist: Croitoru 11/12/2022 Weight: 249 lbs 03/03/2023 Weight: 234 lbs 06/05/2023 Weight: 230 lbs 09/15/2023 Office Weight: 242 lbs    01/14/2024 Office Weight: 250 lbs     02/20/2024 Weight: 240 lbs                                           Spoke with patient and heart failure questions reviewed.  Transmission results reviewed.  Pt took Lasix  a few days last week due to feeling like he had fluid and has resolved.     Optivol thoracic impedance suggesting intermittent days with possible fluid accumulation within the last month.   Prescribed:  Furosemide  20 mg take 1 tablet by mouth as needed for up to 3 days for fluid or edema (Take 20 mg= 1 tablet daily as needed for weight gain of 3 pounds in a day or 5 pounds in a week.  Per 09/15/2023 OV note, Dr Francyne is aware he takes extra when needed.   Labs: 01/14/2024 Creatinine 1.16, BUN 16, Potassium 4.1, Sodium 141, GFR 65 07/16/2023 Creatinine 1.17, BUN 16, Potassium 4.3, Sodium 140, GFR 64 A complete set of results can be found in Results Review.   Recommendations:  No changes and encouraged to call if experiencing any fluid symptoms.   Follow-up plan: Next ICM remote phone appointment 03/29/2024.    91 day device clinic remote transmission 04/28/2024.     EP/Cardiology Office Visits:  Recall 09/14/2024 with Dr Francyne.   Copy of ICM check sent to Dr. Francyne.    3 month ICM trend: 02/16/2024.    12-14 Month ICM trend:     [name], RN 02/20/2024 8:24 AM

## 2024-02-25 ENCOUNTER — Emergency Department (HOSPITAL_COMMUNITY)

## 2024-02-25 ENCOUNTER — Other Ambulatory Visit: Payer: Self-pay

## 2024-02-25 ENCOUNTER — Encounter (HOSPITAL_COMMUNITY): Payer: Self-pay | Admitting: Emergency Medicine

## 2024-02-25 ENCOUNTER — Inpatient Hospital Stay (HOSPITAL_COMMUNITY)
Admission: EM | Admit: 2024-02-25 | Discharge: 2024-02-28 | DRG: 291 | Disposition: A | Discharge status: Home / Self Care | Attending: Internal Medicine | Admitting: Internal Medicine

## 2024-02-25 DIAGNOSIS — Z825 Family history of asthma and other chronic lower respiratory diseases: Secondary | ICD-10-CM | POA: Diagnosis not present

## 2024-02-25 DIAGNOSIS — I130 Hypertensive heart and chronic kidney disease with heart failure and stage 1 through stage 4 chronic kidney disease, or unspecified chronic kidney disease: Secondary | ICD-10-CM | POA: Diagnosis present

## 2024-02-25 DIAGNOSIS — I444 Left anterior fascicular block: Secondary | ICD-10-CM | POA: Diagnosis present

## 2024-02-25 DIAGNOSIS — Z7984 Long term (current) use of oral hypoglycemic drugs: Secondary | ICD-10-CM | POA: Diagnosis not present

## 2024-02-25 DIAGNOSIS — I255 Ischemic cardiomyopathy: Secondary | ICD-10-CM | POA: Diagnosis present

## 2024-02-25 DIAGNOSIS — I4892 Unspecified atrial flutter: Secondary | ICD-10-CM | POA: Diagnosis present

## 2024-02-25 DIAGNOSIS — I340 Nonrheumatic mitral (valve) insufficiency: Secondary | ICD-10-CM | POA: Diagnosis present

## 2024-02-25 DIAGNOSIS — I480 Paroxysmal atrial fibrillation: Secondary | ICD-10-CM | POA: Diagnosis present

## 2024-02-25 DIAGNOSIS — Z95810 Presence of automatic (implantable) cardiac defibrillator: Secondary | ICD-10-CM

## 2024-02-25 DIAGNOSIS — E876 Hypokalemia: Secondary | ICD-10-CM | POA: Diagnosis present

## 2024-02-25 DIAGNOSIS — Z79899 Other long term (current) drug therapy: Secondary | ICD-10-CM

## 2024-02-25 DIAGNOSIS — N1831 Chronic kidney disease, stage 3a: Secondary | ICD-10-CM | POA: Diagnosis present

## 2024-02-25 DIAGNOSIS — N644 Mastodynia: Secondary | ICD-10-CM | POA: Diagnosis present

## 2024-02-25 DIAGNOSIS — E1169 Type 2 diabetes mellitus with other specified complication: Secondary | ICD-10-CM | POA: Diagnosis present

## 2024-02-25 DIAGNOSIS — Z23 Encounter for immunization: Secondary | ICD-10-CM

## 2024-02-25 DIAGNOSIS — I5021 Acute systolic (congestive) heart failure: Secondary | ICD-10-CM | POA: Diagnosis present

## 2024-02-25 DIAGNOSIS — Z96652 Presence of left artificial knee joint: Secondary | ICD-10-CM | POA: Diagnosis present

## 2024-02-25 DIAGNOSIS — E782 Mixed hyperlipidemia: Secondary | ICD-10-CM | POA: Diagnosis present

## 2024-02-25 DIAGNOSIS — Z9861 Coronary angioplasty status: Secondary | ICD-10-CM

## 2024-02-25 DIAGNOSIS — I2510 Atherosclerotic heart disease of native coronary artery without angina pectoris: Secondary | ICD-10-CM | POA: Diagnosis present

## 2024-02-25 DIAGNOSIS — I252 Old myocardial infarction: Secondary | ICD-10-CM

## 2024-02-25 DIAGNOSIS — E1122 Type 2 diabetes mellitus with diabetic chronic kidney disease: Secondary | ICD-10-CM | POA: Diagnosis present

## 2024-02-25 DIAGNOSIS — Z87891 Personal history of nicotine dependence: Secondary | ICD-10-CM

## 2024-02-25 DIAGNOSIS — I5023 Acute on chronic systolic (congestive) heart failure: Secondary | ICD-10-CM | POA: Diagnosis present

## 2024-02-25 DIAGNOSIS — Z87442 Personal history of urinary calculi: Secondary | ICD-10-CM

## 2024-02-25 DIAGNOSIS — Z7901 Long term (current) use of anticoagulants: Secondary | ICD-10-CM

## 2024-02-25 DIAGNOSIS — I509 Heart failure, unspecified: Principal | ICD-10-CM

## 2024-02-25 DIAGNOSIS — Z1152 Encounter for screening for COVID-19: Secondary | ICD-10-CM | POA: Diagnosis not present

## 2024-02-25 DIAGNOSIS — I2585 Chronic coronary microvascular dysfunction: Secondary | ICD-10-CM | POA: Diagnosis not present

## 2024-02-25 DIAGNOSIS — I10 Essential (primary) hypertension: Secondary | ICD-10-CM | POA: Diagnosis present

## 2024-02-25 LAB — TROPONIN I (HIGH SENSITIVITY)
Troponin I (High Sensitivity): 22 ng/L — ABNORMAL HIGH (ref <18)
Troponin I (High Sensitivity): 25 ng/L — ABNORMAL HIGH (ref <18)

## 2024-02-25 LAB — I-STAT CHEM 8, ED
BUN: 12 mg/dL (ref 8–23)
Calcium, Ion: 1.14 mmol/L — ABNORMAL LOW (ref 1.15–1.40)
Chloride: 106 mmol/L (ref 98–111)
Creatinine, Ser: 1.4 mg/dL — ABNORMAL HIGH (ref 0.61–1.24)
Glucose, Bld: 146 mg/dL — ABNORMAL HIGH (ref 70–99)
HCT: 44 % (ref 39.0–52.0)
Hemoglobin: 15 g/dL (ref 13.0–17.0)
Potassium: 3.7 mmol/L (ref 3.5–5.1)
Sodium: 144 mmol/L (ref 135–145)
TCO2: 24 mmol/L (ref 22–32)

## 2024-02-25 LAB — CBC WITH DIFFERENTIAL/PLATELET
Abs Immature Granulocytes: 0.02 K/uL (ref 0.00–0.07)
Basophils Absolute: 0 K/uL (ref 0.0–0.1)
Basophils Relative: 0 %
Eosinophils Absolute: 0.2 K/uL (ref 0.0–0.5)
Eosinophils Relative: 3 %
HCT: 44.1 % (ref 39.0–52.0)
Hemoglobin: 14.6 g/dL (ref 13.0–17.0)
Immature Granulocytes: 0 %
Lymphocytes Relative: 26 %
Lymphs Abs: 1.6 K/uL (ref 0.7–4.0)
MCH: 34 pg (ref 26.0–34.0)
MCHC: 33.1 g/dL (ref 30.0–36.0)
MCV: 102.6 fL — ABNORMAL HIGH (ref 80.0–100.0)
Monocytes Absolute: 0.5 K/uL (ref 0.1–1.0)
Monocytes Relative: 8 %
Neutro Abs: 4.1 K/uL (ref 1.7–7.7)
Neutrophils Relative %: 63 %
Platelets: 154 K/uL (ref 150–400)
RBC: 4.3 MIL/uL (ref 4.22–5.81)
RDW: 13.8 % (ref 11.5–15.5)
WBC: 6.4 K/uL (ref 4.0–10.5)
nRBC: 0 % (ref 0.0–0.2)

## 2024-02-25 LAB — BASIC METABOLIC PANEL WITH GFR
Anion gap: 11 (ref 5–15)
BUN: 12 mg/dL (ref 8–23)
CO2: 24 mmol/L (ref 22–32)
Calcium: 8.9 mg/dL (ref 8.9–10.3)
Chloride: 108 mmol/L (ref 98–111)
Creatinine, Ser: 1.36 mg/dL — ABNORMAL HIGH (ref 0.61–1.24)
GFR, Estimated: 54 mL/min — ABNORMAL LOW (ref >60)
Glucose, Bld: 143 mg/dL — ABNORMAL HIGH (ref 70–99)
Potassium: 3.8 mmol/L (ref 3.5–5.1)
Sodium: 143 mmol/L (ref 135–145)

## 2024-02-25 LAB — BRAIN NATRIURETIC PEPTIDE: B Natriuretic Peptide: 4384.2 pg/mL — ABNORMAL HIGH (ref 0.0–100.0)

## 2024-02-25 MED ORDER — FUROSEMIDE 10 MG/ML IJ SOLN
40.0000 mg | Freq: Once | INTRAMUSCULAR | Status: AC
  Administered 2024-02-25: 40 mg via INTRAVENOUS
  Filled 2024-02-25: qty 4

## 2024-02-25 NOTE — ED Notes (Signed)
 Not in room

## 2024-02-25 NOTE — ED Provider Notes (Signed)
 [place] EMERGENCY DEPARTMENT AT [facility] Provider Note   CSN: [id number] Arrival date & time: 02/25/24  1613     Patient presents with: Shortness of Breath   Kevin Patel is a 78 y.o. male.   HPI Patient reports he has noticed a few pounds of weight gain.  He reports he has gotten more short of breath over the past month and then significantly more short of breath over the past couple of days.  Patient reports he is getting winded with exertion.  He denies any chest pain or abdominal pain.  He does however feel sometimes like his abdomen is full and pushing up onto his chest.  No fevers or vomiting.  Patient notes a little bit of swelling in his legs.  He took a 20 mg dose of Lasix  today and has peed several times but has not noticed much improvement in his shortness of breath.    Prior to Admission medications   Medication Sig Start Date End Date Taking? Authorizing Provider  apixaban  (ELIQUIS ) 5 MG TABS tablet Take 1 tablet (5 mg total) by mouth 2 (two) times daily. 01/12/24  Yes Croitoru, Mihai, MD  ascorbic acid (VITAMIN C) 1000 MG tablet Take 1,000 mg by mouth at bedtime.   Yes [provider]  atorvastatin  (LIPITOR ) 80 MG tablet Take 1 tablet by mouth once daily Patient taking differently: Take 80 mg by mouth at bedtime. 11/18/23  Yes Croitoru, Mihai, MD  carvedilol  (COREG ) 25 MG tablet TAKE 1 TABLET BY MOUTH TWICE DAILY WITH A MEAL 11/18/23  Yes Croitoru, Mihai, MD  Cholecalciferol 25 MCG (1000 UT) tablet Take 1,000 Units by mouth daily.   Yes [provider]  empagliflozin  (JARDIANCE ) 10 MG TABS tablet TAKE 1 TABLET BY MOUTH ONCE DAILY BEFORE BREAKFAST Patient taking differently: Take 10 mg by mouth daily. 08/26/23  Yes Croitoru, Mihai, MD  furosemide  (LASIX ) 20 MG tablet Take 1 tablet (20 mg total) by mouth daily as needed for up to 3 days for fluid or edema (Take 20 mg= 1 tablet daily as needed for weight gain of 3 pounds in a day or 5 pounds in a  week). 10/28/22 06/09/24 Yes Croitoru, Mihai, MD  Multiple Vitamins-Minerals (MENS 50+ MULTIVITAMIN PO) Take 1 capsule by mouth at bedtime.   Yes [provider]  nitroGLYCERIN  (NITROSTAT ) 0.4 MG SL tablet Place 1 tablet (0.4 mg total) under the tongue every 5 (five) minutes as needed for chest pain. 02/12/23  Yes Croitoru, Mihai, MD  sacubitril -valsartan  (ENTRESTO ) 97-103 MG Take 1 tablet by mouth twice daily 11/21/23  Yes Croitoru, Mihai, MD    Allergies: Spironolactone     Review of Systems  Updated Vital Signs BP (!) 137/98   Pulse 70   Temp 97.6 F (36.4 C)   Resp 13   SpO2 98%   Physical Exam Constitutional:      Comments: Alert.  No acute distress.  Mild increased work of breathing at rest.  HENT:     Mouth/Throat:     Pharynx: Oropharynx is clear.  Eyes:     Extraocular Movements: Extraocular movements intact.  Cardiovascular:     Rate and Rhythm: Normal rate and regular rhythm.  Pulmonary:     Comments: Mild increased work of breathing.  Speaking in full sentences.  Crackles lower one third lung bases Abdominal:     General: There is no distension.     Palpations: Abdomen is soft.     Tenderness: There is no  abdominal tenderness. There is no guarding.  Musculoskeletal:     Comments: 1+ pitting edema bilateral lower extremities.  Calves soft and pliable.  Skin:    General: Skin is warm and dry.  Neurological:     General: No focal deficit present.     Mental Status: He is oriented to person, place, and time.     Motor: No weakness.     Coordination: Coordination normal.  Psychiatric:        Mood and Affect: Mood normal.     (all labs ordered are listed, but only abnormal results are displayed) Labs Reviewed  BASIC METABOLIC PANEL WITH GFR - Abnormal; Notable for the following components:      Result Value   Glucose, Bld 143 (*)    Creatinine, Ser 1.36 (*)    GFR, Estimated 54 (*)    All other components within normal limits  CBC WITH  DIFFERENTIAL/PLATELET - Abnormal; Notable for the following components:   MCV 102.6 (*)    All other components within normal limits  BRAIN NATRIURETIC PEPTIDE - Abnormal; Notable for the following components:   B Natriuretic Peptide 4,384.2 (*)    All other components within normal limits  I-STAT CHEM 8, ED - Abnormal; Notable for the following components:   Creatinine, Ser 1.40 (*)    Glucose, Bld 146 (*)    Calcium , Ion 1.14 (*)    All other components within normal limits  TROPONIN I (HIGH SENSITIVITY) - Abnormal; Notable for the following components:   Troponin I (High Sensitivity) 22 (*)    All other components within normal limits  TROPONIN I (HIGH SENSITIVITY) - Abnormal; Notable for the following components:   Troponin I (High Sensitivity) 25 (*)    All other components within normal limits  RESP PANEL BY RT-PCR (RSV, FLU A&B, COVID)  [id number]    EKG: EKG Interpretation Date/Time:  Wednesday February 25 2024 16:27:47 EDT Ventricular Rate:  78 PR Interval:  184 QRS Duration:  146 QT Interval:  402 QTC Calculation: 458 R Axis:   -7  Text Interpretation: Sinus rhythm with Premature atrial complexes Left bundle branch block Abnormal ECG When compared with ECG of 15-Sep-2023 13:43, PREVIOUS ECG IS PRESENT old LBBB no sig change from previous Confirmed by Armenta Canning [phone]) on 02/25/2024 10:57:04 PM  Radiology: [facility] Chest 2 View Result Date: 02/25/2024 CLINICAL DATA:  Shortness of breath. EXAM: CHEST - 2 VIEW COMPARISON:  1524 FINDINGS: Chronic cardiomegaly. Left-sided pacemaker in place. Vascular congestion. There are small bilateral pleural effusions and fluid in the fissures. No confluent opacities. No pneumothorax. No acute osseous finding. IMPRESSION: Cardiomegaly with vascular congestion and small bilateral pleural effusions. Electronically Signed   By: Andrea Gasman M.D.   On: 02/25/2024 18:48     Procedures  CRITICAL CARE Performed by: Canning Armenta   Total  critical care time: 30 minutes  Critical care time was exclusive of separately billable procedures and treating other patients.  Critical care was necessary to treat or prevent imminent or life-threatening deterioration.  Critical care was time spent personally by me on the following activities: development of treatment plan with patient and/or surrogate as well as nursing, discussions with consultants, evaluation of patient's response to treatment, examination of patient, obtaining history from patient or surrogate, ordering and performing treatments and interventions, ordering and review of laboratory studies, ordering and review of radiographic studies, pulse oximetry and re-evaluation of patient's condition.  Medications Ordered in the ED  furosemide  (LASIX ) injection 40  mg (has no administration in time range)                                    Medical Decision Making Risk Prescription drug management. Decision regarding hospitalization.  Patient presents as outlined with worsening shortness of breath.  He has tried Lasix  but still feels exertional shortness of breath.  BNP 4 382, troponin 25, GFR 54  Chest x-ray personally reviewed by myself and interpreted radiology cardiomegaly with vascular congestion.  Patient does have findings consistent with CHF.  He has been worsening on outpatient basis.  Patient has tried home Lasix .  At this time we will plan for admission and treatment with IV Lasix  for CHF exacerbation.  Consult: Hospitalist Dr. Alfornia for admission     Final diagnoses:  Acute on chronic congestive heart failure, unspecified heart failure type [facility])    ED Discharge Orders     None          Armenta Canning, MD 02/25/24 2336

## 2024-02-25 NOTE — ED Triage Notes (Signed)
 SOB started this morning. Worse on exertion. Hx of CHF. Denies any pain. Took PRN lasix  at 1130am .

## 2024-02-25 NOTE — ED Provider Triage Note (Signed)
 Emergency Medicine Provider Triage Evaluation Note  Kevin Patel , a 78 y.o. male  was evaluated in triage.  Pt complains of SHOB. Onset 1 year ago, intermittent, more bothersome over the past week. No fevers, no CP, no orthopnea. Took Lasix  but no lower ext edema.  Review of Systems  Positive:  Negative:   Physical Exam  BP 113/73   Pulse 73   Temp (!) 96.5 F (35.8 C)   Resp 19   SpO2 94%  Gen:   Awake, no distress   Resp:  Normal effort  MSK:   Moves extremities without difficulty  Other:  Lungs CTA. Trace lower ext edema  Medical Decision Making  Medically screening exam initiated at 4:36 PM.  Appropriate orders placed.  Kevin Patel was informed that the remainder of the evaluation will be completed by another provider, this initial triage assessment does not replace that evaluation, and the importance of remaining in the ED until their evaluation is complete.     Beverley Leita LABOR, PA-C 02/25/24 [phone]

## 2024-02-26 ENCOUNTER — Other Ambulatory Visit: Payer: Self-pay

## 2024-02-26 ENCOUNTER — Inpatient Hospital Stay (HOSPITAL_COMMUNITY)

## 2024-02-26 DIAGNOSIS — E1169 Type 2 diabetes mellitus with other specified complication: Secondary | ICD-10-CM

## 2024-02-26 DIAGNOSIS — I2585 Chronic coronary microvascular dysfunction: Secondary | ICD-10-CM

## 2024-02-26 DIAGNOSIS — I5021 Acute systolic (congestive) heart failure: Secondary | ICD-10-CM

## 2024-02-26 DIAGNOSIS — I480 Paroxysmal atrial fibrillation: Secondary | ICD-10-CM

## 2024-02-26 DIAGNOSIS — N1831 Chronic kidney disease, stage 3a: Secondary | ICD-10-CM

## 2024-02-26 DIAGNOSIS — I5023 Acute on chronic systolic (congestive) heart failure: Secondary | ICD-10-CM

## 2024-02-26 DIAGNOSIS — I10 Essential (primary) hypertension: Secondary | ICD-10-CM

## 2024-02-26 DIAGNOSIS — E782 Mixed hyperlipidemia: Secondary | ICD-10-CM

## 2024-02-26 LAB — BASIC METABOLIC PANEL WITH GFR
Anion gap: 13 (ref 5–15)
BUN: 14 mg/dL (ref 8–23)
CO2: 29 mmol/L (ref 22–32)
Calcium: 9.1 mg/dL (ref 8.9–10.3)
Chloride: 101 mmol/L (ref 98–111)
Creatinine, Ser: 1.53 mg/dL — ABNORMAL HIGH (ref 0.61–1.24)
GFR, Estimated: 47 mL/min — ABNORMAL LOW (ref >60)
Glucose, Bld: 166 mg/dL — ABNORMAL HIGH (ref 70–99)
Potassium: 3.5 mmol/L (ref 3.5–5.1)
Sodium: 143 mmol/L (ref 135–145)

## 2024-02-26 LAB — ECHOCARDIOGRAM COMPLETE
AR max vel: 2.25 cm2
AV Peak grad: 4 mmHg
Ao pk vel: 1 m/s
Area-P 1/2: 3.91 cm2
Height: 76 in
MV M vel: 4.35 m/s
MV Peak grad: 75.7 mmHg
MV VTI: 0.29 cm2
Radius: 1.24 cm
S' Lateral: 6.4 cm
Weight: 3760.17 [oz_av]

## 2024-02-26 LAB — MAGNESIUM: Magnesium: 1.7 mg/dL (ref 1.7–2.4)

## 2024-02-26 LAB — GLUCOSE, CAPILLARY
Glucose-Capillary: 136 mg/dL — ABNORMAL HIGH (ref 70–99)
Glucose-Capillary: 137 mg/dL — ABNORMAL HIGH (ref 70–99)

## 2024-02-26 LAB — RESP PANEL BY RT-PCR (RSV, FLU A&B, COVID)  RVPGX2
Influenza A by PCR: NEGATIVE
Influenza B by PCR: NEGATIVE
Resp Syncytial Virus by PCR: NEGATIVE
SARS Coronavirus 2 by RT PCR: NEGATIVE

## 2024-02-26 LAB — CBG MONITORING, ED
Glucose-Capillary: 171 mg/dL — ABNORMAL HIGH (ref 70–99)
Glucose-Capillary: 88 mg/dL (ref 70–99)
Glucose-Capillary: 102 mg/dL — ABNORMAL HIGH (ref 70–99)

## 2024-02-26 MED ORDER — FUROSEMIDE 10 MG/ML IJ SOLN
40.0000 mg | Freq: Two times a day (BID) | INTRAMUSCULAR | Status: DC
  Administered 2024-02-26 – 2024-02-27 (×4): 40 mg via INTRAVENOUS
  Filled 2024-02-26 (×3): qty 4

## 2024-02-26 MED ORDER — APIXABAN 5 MG PO TABS
5.0000 mg | ORAL_TABLET | Freq: Two times a day (BID) | ORAL | Status: DC
  Administered 2024-02-26 – 2024-02-28 (×5): 5 mg via ORAL
  Filled 2024-02-26 (×5): qty 1

## 2024-02-26 MED ORDER — ATORVASTATIN CALCIUM 80 MG PO TABS
80.0000 mg | ORAL_TABLET | Freq: Every day | ORAL | Status: DC
  Administered 2024-02-26 – 2024-02-27 (×2): 80 mg via ORAL
  Filled 2024-02-26 (×2): qty 1

## 2024-02-26 MED ORDER — EMPAGLIFLOZIN 10 MG PO TABS
10.0000 mg | ORAL_TABLET | Freq: Every day | ORAL | Status: DC
  Administered 2024-02-26 – 2024-02-28 (×3): 10 mg via ORAL
  Filled 2024-02-26 (×3): qty 1

## 2024-02-26 MED ORDER — POTASSIUM CHLORIDE CRYS ER 20 MEQ PO TBCR
40.0000 meq | EXTENDED_RELEASE_TABLET | Freq: Once | ORAL | Status: AC
  Administered 2024-02-26: 40 meq via ORAL
  Filled 2024-02-26: qty 2

## 2024-02-26 MED ORDER — ENSURE PLUS HIGH PROTEIN PO LIQD
237.0000 mL | Freq: Two times a day (BID) | ORAL | Status: DC
  Administered 2024-02-27 – 2024-02-28 (×3): 237 mL via ORAL

## 2024-02-26 MED ORDER — MAGNESIUM SULFATE 2 GM/50ML IV SOLN
2.0000 g | Freq: Once | INTRAVENOUS | Status: AC
  Administered 2024-02-26: 2 g via INTRAVENOUS
  Filled 2024-02-26: qty 50

## 2024-02-26 MED ORDER — PERFLUTREN LIPID MICROSPHERE
1.0000 mL | INTRAVENOUS | Status: AC | PRN
  Administered 2024-02-26: 4 mL via INTRAVENOUS

## 2024-02-26 MED ORDER — ACETAMINOPHEN 325 MG PO TABS: 650.0000 mg | ORAL_TABLET | Freq: Four times a day (QID) | ORAL | Status: DC | PRN

## 2024-02-26 MED ORDER — INSULIN ASPART 100 UNIT/ML IJ SOLN: 0.0000 [IU] | Freq: Every day | INTRAMUSCULAR | Status: DC

## 2024-02-26 MED ORDER — CARVEDILOL 25 MG PO TABS
25.0000 mg | ORAL_TABLET | Freq: Two times a day (BID) | ORAL | Status: DC
Start: 2024-02-26 — End: 2024-02-28
  Administered 2024-02-26 – 2024-02-28 (×5): 25 mg via ORAL
  Filled 2024-02-26 (×3): qty 1
  Filled 2024-02-26: qty 2
  Filled 2024-02-26: qty 1

## 2024-02-26 MED ORDER — SACUBITRIL-VALSARTAN 97-103 MG PO TABS
1.0000 | ORAL_TABLET | Freq: Two times a day (BID) | ORAL | Status: DC
  Administered 2024-02-26 – 2024-02-28 (×5): 1 via ORAL
  Filled 2024-02-26 (×6): qty 1

## 2024-02-26 MED ORDER — ACETAMINOPHEN 650 MG RE SUPP: 650.0000 mg | Freq: Four times a day (QID) | RECTAL | Status: DC | PRN

## 2024-02-26 MED ORDER — INFLUENZA VAC SPLIT HIGH-DOSE 0.5 ML IM SUSY
0.5000 mL | PREFILLED_SYRINGE | INTRAMUSCULAR | Status: AC
  Administered 2024-02-27: 0.5 mL via INTRAMUSCULAR
  Filled 2024-02-26: qty 0.5

## 2024-02-26 MED ORDER — INSULIN ASPART 100 UNIT/ML IJ SOLN
0.0000 [IU] | Freq: Three times a day (TID) | INTRAMUSCULAR | Status: DC
  Administered 2024-02-26: 1 [IU] via SUBCUTANEOUS
  Administered 2024-02-27: 2 [IU] via SUBCUTANEOUS
  Administered 2024-02-27: 1 [IU] via SUBCUTANEOUS

## 2024-02-26 NOTE — Assessment & Plan Note (Addendum)
 No chest pain, no acute coronary syndrome  Ischemic cardiomyopathy, PET scan 2024 with extensive anteroapical scar with only mild peri infarct reversible ischemia. Old records personally reviewed, would not benefit from revascularization.   Continue statin, not on aspirin  because he is on DOAC.  Continue B blocker.

## 2024-02-26 NOTE — ED Notes (Signed)
 Food given to the pt ordered by dr bero

## 2024-02-26 NOTE — ED Notes (Signed)
 urine

## 2024-02-26 NOTE — ED Notes (Signed)
 Admitting doctor  seeing the pt

## 2024-02-26 NOTE — ED Notes (Signed)
 PT alert, NAD, calm, interactive, speaking in clear complete sentences, VSS, lying on R side, attempting sleep, arousable to voice, SPO2 intermittently low, O2 2L [place] placed.

## 2024-02-26 NOTE — Assessment & Plan Note (Addendum)
 Hypokalemia.   Renal function with serum cr at 1,27 with K at 3,3 and serum bicarbonate at 25  Na 139 and Mg 1.9   Patient will have 40 meq Kcl po x2 doses before discharge home.  Continue furosemide , SGLT 2 inh and mineralocorticoid receptor blocker.  Follow up renal function and electrolytes as outpatient.  Follow up renal function and electrolytes in am.  Hold on further IV furosemide  for now.

## 2024-02-26 NOTE — ED Notes (Signed)
Dr. Rathore at bedside 

## 2024-02-26 NOTE — Assessment & Plan Note (Addendum)
 Patient was placed on insulin  sliding scale for glucose cover and monitoring, during his hospitalization.  Glucose remained stable.   Continue statin

## 2024-02-26 NOTE — ED Notes (Signed)
 Output 

## 2024-02-26 NOTE — Progress Notes (Signed)
 Echocardiogram 2D Echocardiogram has been performed.  Kevin Patel 02/26/2024, 2:41 PM

## 2024-02-26 NOTE — Assessment & Plan Note (Addendum)
 Currently on sinus rhythm.  Continue anticoagulation with apixaban .   History of ventricular flutter, old records personally reviewed, with documented last episode in 03/2023.  Has defibrillator in place.  Determined not candidate yet for CRT.

## 2024-02-26 NOTE — Assessment & Plan Note (Addendum)
 Echocardiogram with reduced LV systolic function 20 to 25%, LV internal cavity with severe dilatation, grade II diastolic dysfunction (pseudo normalization), RV systolic function preserved, LA with severe dilatation and RA with moderate dilatation,RVSP 58.6 mmHg, severe functional mitral valve regurgitation.   LV apical inferior segment dyskinetic.  Entire anterior septum akinetic  Inferior wall, apical lateral segment , mid inferoseptal segment, apical anterior segment, basal infero septal segment and apex hypokinetic.   Patient was placed on IV furosemide , negative fluid balance was achieved, - 7,298 ml, with significant improvement in his symptoms.   Continue medical therapy with carvedilol , empagliflozin , and entresto .  Patient did not tolerate spironolactone  in the past, due to breast pain, will plan to start eplerenone   Continue loop diuretic 20 mg daily for now.   Has defibrillator in place, per old records, not candidate yet for CRT.

## 2024-02-26 NOTE — Assessment & Plan Note (Signed)
 Continue blood pressure control with entresto  and carvedilol .

## 2024-02-26 NOTE — Progress Notes (Signed)
  Progress Note   Patient: Kevin Patel FMW:[id number] DOB: 05-Apr-1946 DOA: 02/25/2024     1 DOS: the patient was seen and examined on 02/26/2024   Brief hospital course: Mr. Fussell was admitted to the hospital with the working diagnosis of heart failure exacerbation.   78 yo male with the past medical history of coronary artery disease, heart failure, atrial fibrillation, hypertension, T2DM, and CKD who presented with 24 hrs of dyspnea, cough and orthopnea. Noted dietary indiscretion and not adherent to diuretic therapy at home.  On his initial physical examination his blood pressure was 143/109, HR 71, RR 33 and 02 saturation 93%,  Lungs with no wheezing or rhonchi, heart with S1 and S2 present and regular, abdomen with no distention and positive lower extremity edema.   Assessment and Plan: * Acute on chronic systolic CHF (congestive heart failure) (HCC) 2024 echocardiogram with reduced LV systolic function 25 to 30%, ischemic cardiomyopathy.  No significant valvular disease.   Volume status has improved but not yet back to baseline   Plan to continue diuresis with furosemide  40 mg IV bid Medical therapy with carvedilol , empagliflozin , and entresto .  Add spironolactone  if renal function stable.  Follow up on repeat echocardiogram.   Essential hypertension Continue blood pressure control with entresto  and carvedilol .   Paroxysmal atrial fibrillation (HCC) Currently on sinus rhythm.  Continue anticoagulation with apixaban .   CAD (coronary artery disease) No chest pain, no acute coronary syndrome  Continue statin, he is on DOAC  Chronic kidney disease, stage 3a (HCC) Renal function with serum cr at 1,53 with K at 3,5 and serum bicarbonate at 29  Na 143 and Mg 1.7   Plan to add 40 meq Kcl and 2 g mag sulfate.  Follow up renal function and electrolytes.  Continue diuresis   DM type 2 with diabetic mixed hyperlipidemia (HCC) Continue glucose cover and monitoring with insulin   sliding scale Continue statin       Subjective: patient is feeling better, dyspnea and edema are improving.   Physical Exam: Vitals:   02/26/24 0700 02/26/24 0714 02/26/24 0715 02/26/24 0843  BP: 122/86   103/70  Pulse: 66  74 63  Resp: (!) 23  (!) 27 (!) 26  Temp:    97.6 F (36.4 C)  TempSrc:    Oral  SpO2:  100% 90% 100%   Neurology awake and alert ENT with mild pallor  Cardiovascular with S1 and S2 present and regular with no gallops or murmurs, no rubs Respiratory with mild rales at bases with no wheezing or rhonchi  Abdomen with no distention  Lower extremity edema +   Data Reviewed:    Family Communication: no family at the bedside   Disposition: Status is: Inpatient Remains inpatient appropriate because: IV diuresis   Planned Discharge Destination: Home     Author: Elidia Toribio Furnace, MD 02/26/2024 10:08 AM  For on call review [email or web address].

## 2024-02-26 NOTE — ED Notes (Signed)
 RN assessed pt. Pt was laying in bed without complaints.  Pt denies cp, N/V, sob, and back pain at this time.   Pt sinus rhythm on monitor  Primary RN, Laymon Sabin, made aware

## 2024-02-26 NOTE — Plan of Care (Signed)
  Problem: Education: Goal: Ability to describe self-care measures that may prevent or decrease complications (Diabetes Survival Skills Education) will improve Outcome: Progressing   Problem: Coping: Goal: Ability to adjust to condition or change in health will improve Outcome: Progressing   Problem: Fluid Volume: Goal: Ability to maintain a balanced intake and output will improve Outcome: Progressing   Problem: Metabolic: Goal: Ability to maintain appropriate glucose levels will improve Outcome: Progressing   Problem: Tissue Perfusion: Goal: Adequacy of tissue perfusion will improve Outcome: Progressing   Problem: Clinical Measurements: Goal: Respiratory complications will improve Outcome: Progressing   Problem: Clinical Measurements: Goal: Cardiovascular complication will be avoided Outcome: Progressing   Problem: Elimination: Goal: Will not experience complications related to bowel motility Outcome: Progressing   Problem: Skin Integrity: Goal: Risk for impaired skin integrity will decrease Outcome: Progressing

## 2024-02-26 NOTE — Hospital Course (Addendum)
 Kevin Patel was admitted to the hospital with the working diagnosis of heart failure exacerbation.   78 yo male with the past medical history of coronary artery disease, heart failure, atrial fibrillation, hypertension, T2DM, and CKD who presented with 24 hrs of dyspnea, cough and orthopnea. Noted dietary indiscretion and not adherent to diuretic therapy at home.  On his initial physical examination his blood pressure was 143/109, HR 71, RR 33 and 02 saturation 93%,  Lungs with no wheezing or rhonchi, heart with S1 and S2 present and regular, abdomen with no distention and positive lower extremity edema.   Na 143, K 3.8 Cl 108 bicarbonate 24 glucose 143, bun 12 cr 1.36  BNP 4382  High sensitive troponin 22 and 25  Wbc 6.4 hgb 14.6 plt 154  Sars covid 19 negative Influenza negative RSV negative   Chest radiograph with cardiomegaly, bilateral hilar vascular congestion, with bilateral cephalization of the vasculature and bilateral pleural effusions. Defibrillator in place with one right ventricular lead.   EKG 78 bpm, left axis deviation, left anterior fascicular block, left bundle branch block, sinus rhythm with poor RR wave progression, positive PAC, with no significant ST segment or T wave changes.   Patient was placed on IV furosemide  for diuresis.  Echocardiogram with persistent low LV systolic function   09/20 improved volume status, plan to continue oral loop diuretic therapy at home and follow up as outpatient.

## 2024-02-26 NOTE — H&P (Signed)
 History and Physical    Kevin Patel FMW:[id number] DOB: 1945/09/03 DOA: 02/25/2024  PCP: Thurmond Cathlyn LABOR., MD  Patient coming from: Home  Chief Complaint: Shortness of breath  HPI: Kevin Patel is a 78 y.o. 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  He is not sure if he has gained weight or whether he has lower extremity edema.  Denies fevers or chest pain.  He does report eating a lot of fast food/[employer].  Patient states he was told to take Lasix  20 mg daily as needed if he gained 5 pounds.  He has taken Lasix  about 3 times in the past week.  No other complaints.  ED Course: Afebrile and not tachycardic.  Not hypoxic.  EKG without acute ischemic changes.  Labs notable for creatinine 1.36 (baseline 1.0-1.2), troponin 22> 25, BNP 4384, COVID/influenza/RSV PCR negative.  Chest x-ray showing cardiomegaly with vascular congestion and small bilateral pleural effusions.  Patient was given IV Lasix  40 mg.  Review of Systems:  Review of Systems  All other systems reviewed and are negative.   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   Arthritis    knees   Chronic systolic HF (heart failure) (HCC)    Coronary artery disease    Diabetes mellitus with coincident hypertension (HCC) 09/09/2016   History of kidney stones    HTN (hypertension)    Hyperlipidemia    Ischemic cardiomyopathy    MI (myocardial infarction) (HCC) 1998   2018,   Mural thrombus of heart    on coumadin until follow up echo with contrast in Sept 13, 2012 which showed no thrombus and coumadin was d/c    Pre-diabetes     Past Surgical History:  Procedure Laterality Date   CORONARY ANGIOPLASTY  06/10/1996   PCI   CORONARY STENT INTERVENTION N/A 09/05/2016   Procedure: Coronary Stent Intervention;  Surgeon: Lonni JONETTA Cash, MD;  Location: [facility] INVASIVE CV LAB;  Service: Cardiovascular;  Laterality: N/A;   CORONARY ULTRASOUND/IVUS N/A 09/05/2016   Procedure: Intravascular Ultrasound/IVUS;  Surgeon: Lonni JONETTA Cash, MD;  Location: [facility] INVASIVE CV LAB;  Service: Cardiovascular;  Laterality: N/A;   HERNIA REPAIR  2004   ICD IMPLANT N/A 02/12/2017   Procedure: ICD Implant;  Surgeon: Francyne Headland, MD;  Location: [facility] INVASIVE CV LAB;  Service: Cardiovascular;  Laterality: N/A;   LEFT HEART CATH AND CORONARY ANGIOGRAPHY N/A 09/05/2016   Procedure: Left Heart Cath and Coronary Angiography;  Surgeon: Lonni JONETTA Cash, MD;  Location: [facility] INVASIVE CV LAB;  Service: Cardiovascular;  Laterality: N/A;   TOTAL KNEE ARTHROPLASTY Left 01/29/2021   Procedure: TOTAL KNEE ARTHROPLASTY;  Surgeon: Melodi Lerner, MD;  Location: [facility] ORS;  Service: Orthopedics;  Laterality: Left;     reports that he quit smoking about 31 years ago. His smoking use included cigarettes. He started smoking about 51 years ago. He has a 20 pack-year smoking history. He has never used smokeless tobacco. He reports that he does not drink alcohol and does not use drugs.  Allergies  Allergen Reactions   Spironolactone  Other (See Comments)    Made patient's nipples sore.     Family History  Problem Relation Age of  Onset   Emphysema Father     Prior to Admission medications   Medication Sig Start Date End Date Taking? Authorizing Provider  apixaban  (ELIQUIS ) 5 MG TABS tablet Take 1 tablet (5 mg total) by mouth 2 (two) times daily. 01/12/24  Yes Croitoru, Mihai, MD  ascorbic acid (VITAMIN C) 1000 MG tablet Take 1,000 mg by mouth at bedtime.   Yes [provider]  atorvastatin  (LIPITOR ) 80 MG tablet Take 1 tablet  by mouth once daily Patient taking differently: Take 80 mg by mouth at bedtime. 11/18/23  Yes Croitoru, Mihai, MD  carvedilol  (COREG ) 25 MG tablet TAKE 1 TABLET BY MOUTH TWICE DAILY WITH A MEAL 11/18/23  Yes Croitoru, Mihai, MD  Cholecalciferol 25 MCG (1000 UT) tablet Take 1,000 Units by mouth daily.   Yes [provider]  empagliflozin  (JARDIANCE ) 10 MG TABS tablet TAKE 1 TABLET BY MOUTH ONCE DAILY BEFORE BREAKFAST Patient taking differently: Take 10 mg by mouth daily. 08/26/23  Yes Croitoru, Mihai, MD  furosemide  (LASIX ) 20 MG tablet Take 1 tablet (20 mg total) by mouth daily as needed for up to 3 days for fluid or edema (Take 20 mg= 1 tablet daily as needed for weight gain of 3 pounds in a day or 5 pounds in a week). 10/28/22 06/09/24 Yes Croitoru, Mihai, MD  Multiple Vitamins-Minerals (MENS 50+ MULTIVITAMIN PO) Take 1 capsule by mouth at bedtime.   Yes [provider]  nitroGLYCERIN  (NITROSTAT ) 0.4 MG SL tablet Place 1 tablet (0.4 mg total) under the tongue every 5 (five) minutes as needed for chest pain. 02/12/23  Yes Croitoru, Mihai, MD  sacubitril -valsartan  (ENTRESTO ) 97-103 MG Take 1 tablet by mouth twice daily 11/21/23  Yes Croitoru, Mihai, MD    Physical Exam: Vitals:   02/25/24 2300 02/25/24 2337 02/26/24 0000 02/26/24 0100  BP: (!) 143/109  (!) 138/100 (!) 128/104  Pulse: 85  70 71  Resp: (!) 33  16 17  Temp:  98.5 F (36.9 C)    SpO2: 93%  99% 97%    Physical Exam Vitals reviewed.  Constitutional:      General: He is not in acute distress. HENT:     Head: Normocephalic and atraumatic.  Eyes:     Extraocular Movements: Extraocular movements intact.  Cardiovascular:     Rate and Rhythm: Normal rate and regular rhythm.     Pulses: Normal pulses.  Pulmonary:     Effort: Pulmonary effort is normal. No respiratory distress.     Breath sounds: No wheezing.  Abdominal:     General: Bowel sounds are normal. There is no distension.     Palpations: Abdomen is soft.      Tenderness: There is no abdominal tenderness.  Musculoskeletal:     Cervical back: Normal range of motion.     Right lower leg: Edema present.     Left lower leg: Edema present.  Skin:    General: Skin is warm and dry.  Neurological:     General: No focal deficit present.     Mental Status: He is alert and oriented to person, place, and time.     Labs on Admission: I have personally reviewed following labs and imaging studies  CBC: Recent Labs  Lab 02/25/24 1657 02/25/24 1716  WBC 6.4  --   NEUTROABS 4.1  --   HGB 14.6 15.0  HCT 44.1 44.0  MCV 102.6*  --   PLT 154  --    Basic Metabolic Panel: Recent Labs  Lab 02/25/24 1657 02/25/24 1716  NA 143 144  K 3.8 3.7  CL 108 106  CO2 24  --   GLUCOSE 143* 146*  BUN 12 12  CREATININE 1.36* 1.40*  CALCIUM  8.9  --    GFR: CrCl cannot be calculated (Unknown ideal weight.). Liver Function Tests: No results for input(s): AST, ALT, ALKPHOS, BILITOT, PROT, ALBUMIN in the last 168 hours. No results for input(s): LIPASE, AMYLASE in the last 168 hours. No results for input(s): AMMONIA in the last 168 hours. Coagulation Profile: No results for input(s): INR, PROTIME in the last 168 hours. Cardiac Enzymes: No results for input(s): CKTOTAL, CKMB, CKMBINDEX, TROPONINI in the last 168 hours. BNP (last 3 results) No results for input(s): PROBNP in the last 8760 hours. HbA1C: No results for input(s): HGBA1C in the last 72 hours. CBG: No results for input(s): GLUCAP in the last 168 hours. Lipid Profile: No results for input(s): CHOL, HDL, LDLCALC, TRIG, CHOLHDL, LDLDIRECT in the last 72 hours. Thyroid Function Tests: No results for input(s): TSH, T4TOTAL, FREET4, T3FREE, THYROIDAB in the last 72 hours. Anemia Panel: No results for input(s): VITAMINB12, FOLATE, FERRITIN, TIBC, IRON, RETICCTPCT in the last 72 hours. Urine analysis:    Component Value  Date/Time   COLORURINE YELLOW 01/26/2020 2218   APPEARANCEUR HAZY (A) 01/26/2020 2218   LABSPEC 1.014 01/26/2020 2218   PHURINE 5.0 01/26/2020 2218   GLUCOSEU NEGATIVE 01/26/2020 2218   HGBUR NEGATIVE 01/26/2020 2218   BILIRUBINUR NEGATIVE 01/26/2020 2218   KETONESUR NEGATIVE 01/26/2020 2218   PROTEINUR NEGATIVE 01/26/2020 2218   NITRITE NEGATIVE 01/26/2020 2218   LEUKOCYTESUR NEGATIVE 01/26/2020 2218    Radiological Exams on Admission: DG Chest 2 View Result Date: 02/25/2024 CLINICAL DATA:  Shortness of breath. EXAM: CHEST - 2 VIEW COMPARISON:  1524 FINDINGS: Chronic cardiomegaly. Left-sided pacemaker in place. Vascular congestion. There are small bilateral pleural effusions and fluid in the fissures. No confluent opacities. No pneumothorax. No acute osseous finding. IMPRESSION: Cardiomegaly with vascular congestion and small bilateral pleural effusions. Electronically Signed   By: Andrea Gasman M.D.   On: 02/25/2024 18:48    EKG: Independently reviewed.  Sinus rhythm, LBBB, and no acute ischemic changes in comparison to previous EKG.  Assessment and Plan  Acute on chronic HFrEF Last echo done in June 2024 showing EF 25 to 30%, grade 2 diastolic dysfunction, and mild mitral regurgitation.  Patient is presenting with signs and symptoms of volume overload in the setting of dietary noncompliance/eating a lot of fast food.  Takes Lasix  20 mg daily PRN at home.  BNP 4384.  Chest x-ray showing cardiomegaly with vascular congestion and small bilateral pleural effusions.  Not hypoxic.  Continue diuresis with IV Lasix  40 mg twice daily.  Continue Coreg , Entresto , and Jardiance .  Monitor intake and output, daily weights, renal function and electrolytes.  Low-sodium diet with fluid restriction.  Repeat echocardiogram ordered.  CAD status post PCI in 2018 EKG without acute ischemic changes and troponin minimally elevated (in the setting of acutely decompensated CHF) and stable.  Patient is not  endorsing chest pain.  ACS less likely.  Continue Coreg , Eliquis , and Lipitor .  Paroxysmal A-fib/flutter Currently in sinus rhythm.  Continue Coreg  and Eliquis .  History of ventricular flutter status post ICD Noted.  Hypertension Continue Coreg .  Hyperlipidemia Continue Lipitor .  Type 2 diabetes A1c 6.0 on 01/14/2024.  Placed on sensitive sliding scale insulin  ACHS.  CKD stage 2-3a Creatinine close to baseline, monitor renal function closely.  DVT prophylaxis:  Eliquis  Code Status: Full Code (discussed with the patient) Family Communication: Wife and daughter at bedside. Level of care: Progressive Care Unit Admission status: It is my clinical opinion that admission to INPATIENT is reasonable and necessary because of the expectation that this patient will require hospital care that crosses at least 2 midnights to treat this condition based on the medical complexity of the problems presented.  Given the aforementioned information, the predictability of an adverse outcome is felt to be significant.  Editha Ram MD [facility]  If 7PM-7AM, please contact night-coverage [email or web address]  02/26/2024, 2:57 AM

## 2024-02-27 DIAGNOSIS — I10 Essential (primary) hypertension: Secondary | ICD-10-CM | POA: Diagnosis not present

## 2024-02-27 DIAGNOSIS — I2585 Chronic coronary microvascular dysfunction: Secondary | ICD-10-CM | POA: Diagnosis not present

## 2024-02-27 DIAGNOSIS — I480 Paroxysmal atrial fibrillation: Secondary | ICD-10-CM | POA: Diagnosis not present

## 2024-02-27 DIAGNOSIS — I5023 Acute on chronic systolic (congestive) heart failure: Secondary | ICD-10-CM | POA: Diagnosis not present

## 2024-02-27 LAB — BASIC METABOLIC PANEL WITH GFR
Anion gap: 18 — ABNORMAL HIGH (ref 5–15)
BUN: 16 mg/dL (ref 8–23)
CO2: 26 mmol/L (ref 22–32)
Calcium: 8.9 mg/dL (ref 8.9–10.3)
Chloride: 101 mmol/L (ref 98–111)
Creatinine, Ser: 1.37 mg/dL — ABNORMAL HIGH (ref 0.61–1.24)
GFR, Estimated: 53 mL/min — ABNORMAL LOW (ref >60)
Glucose, Bld: 100 mg/dL — ABNORMAL HIGH (ref 70–99)
Potassium: 4.3 mmol/L (ref 3.5–5.1)
Sodium: 145 mmol/L (ref 135–145)

## 2024-02-27 LAB — GLUCOSE, CAPILLARY
Glucose-Capillary: 106 mg/dL — ABNORMAL HIGH (ref 70–99)
Glucose-Capillary: 154 mg/dL — ABNORMAL HIGH (ref 70–99)
Glucose-Capillary: 132 mg/dL — ABNORMAL HIGH (ref 70–99)
Glucose-Capillary: 137 mg/dL — ABNORMAL HIGH (ref 70–99)

## 2024-02-27 LAB — MAGNESIUM: Magnesium: 2.1 mg/dL (ref 1.7–2.4)

## 2024-02-27 NOTE — Progress Notes (Signed)
 OT Screen Note  Patient Details Name: Kevin Patel MRN: [id number] DOB: 1946/01/03   Cancelled Treatment:    Reason Eval/Treat Not Completed: OT screened, no needs identified, will sign off  Ely Molt 02/27/2024, 7:35 AM

## 2024-02-27 NOTE — Evaluation (Signed)
 Physical Therapy Brief Evaluation and Discharge Note Patient Details Name: Kevin Patel MRN: [id number] DOB: [birth year] Today's Date: 02/27/2024   History of Present Illness  78 yo M adm 02/25/24 with SOB and heart failure exacerbation. PMHx: CHF, HLD, T2DM, neuropathy, PAF, CKD, CAD, HTN, Lt TKA  Clinical Impression  Pt very pleasant and states he enjoys bowling 2x/wk, mows his yard and admittedly doesn't weigh daily or restrict his diet with frequent visits to [employer]. Pt drives, walks without assist and VSS throughout. Pt educated for importance of low sodium diet, walking program and daily weight. Pt voiced understanding but states limited adherence. Pt at baseline functional status without further needs, no hx of falls and encouraged continued ambulation acutely.        PT Assessment Patient does not need any further PT services  Assistance Needed at Discharge  PRN    Equipment Recommendations None recommended by PT  Recommendations for Other Services       Precautions/Restrictions Precautions Precautions: None        Mobility  Bed Mobility   Supine/Sidelying to sit: Modified independent (Device/Increased time)      Transfers Overall transfer level: Modified independent                      Ambulation/Gait Ambulation/Gait assistance: Modified independent (Device/Increase time) Gait Distance (Feet): 500 Feet Assistive device: None Gait Pattern/deviations: Step-through pattern, Decreased stride length, Antalgic, Decreased stance time - right Gait Speed: Pace WFL General Gait Details: decreased stance on RLE due to knee pain, baseline with antalgic gait, no LOB  Home Activity Instructions    Stairs            Modified Rankin (Stroke Patients Only)        Balance Overall balance assessment: Mild deficits observed, not formally tested                        Pertinent Vitals/Pain PT - Brief Vital Signs All Vital Signs Stable:  Yes Pain Assessment Pain Assessment: No/denies pain     Home Living Family/patient expects to be discharged to:: Private residence Living Arrangements: Spouse/significant other Available Help at Discharge: Family;Available 24 hours/day Home Environment: Level entry   Home Equipment: None        Prior Function Level of Independence: Independent      UE/LE Assessment   UE ROM/Strength/Tone/Coordination: WFL    LE ROM/Strength/Tone/Coordination: [facility]      Communication   Communication Communication: No apparent difficulties     Cognition Overall Cognitive Status: Appears within functional limits for tasks assessed/performed       General Comments      Exercises     Assessment/Plan    PT Problem List         PT Visit Diagnosis Other abnormalities of gait and mobility (R26.89)    No Skilled PT All education completed   Co-evaluation                AMPAC 6 Clicks Help needed turning from your back to your side while in a flat bed without using bedrails?: None Help needed moving from lying on your back to sitting on the side of a flat bed without using bedrails?: None Help needed moving to and from a bed to a chair (including a wheelchair)?: None Help needed standing up from a chair using your arms (e.g., wheelchair or bedside chair)?: None Help needed to walk in hospital room?:  None Help needed climbing 3-5 steps with a railing? : A Little 6 Click Score: 23      End of Session   Activity Tolerance: Patient tolerated treatment well Patient left: in chair;with call bell/phone within reach Nurse Communication: Mobility status PT Visit Diagnosis: Other abnormalities of gait and mobility (R26.89)     Time: 9281-9271 PT Time Calculation (min) (ACUTE ONLY): 10 min  Charges:   PT Evaluation $PT Eval Low Complexity: 1 Low      Glendell Fouse P, PT Acute Rehabilitation Services Office: [phone]   Kevin Patel Kevin Patel  02/27/2024, 7:56 AM

## 2024-02-27 NOTE — Plan of Care (Signed)
  Problem: Education: Goal: Ability to describe self-care measures that may prevent or decrease complications (Diabetes Survival Skills Education) will improve Outcome: Progressing   Problem: Fluid Volume: Goal: Ability to maintain a balanced intake and output will improve Outcome: Progressing   Problem: Health Behavior/Discharge Planning: Goal: Ability to manage health-related needs will improve Outcome: Progressing   Problem: Metabolic: Goal: Ability to maintain appropriate glucose levels will improve Outcome: Progressing   Problem: Skin Integrity: Goal: Risk for impaired skin integrity will decrease Outcome: Progressing   Problem: Clinical Measurements: Goal: Will remain free from infection Outcome: Progressing   Problem: Clinical Measurements: Goal: Cardiovascular complication will be avoided Outcome: Progressing   Problem: Activity: Goal: Risk for activity intolerance will decrease Outcome: Progressing   Problem: Nutrition: Goal: Adequate nutrition will be maintained Outcome: Progressing

## 2024-02-27 NOTE — Progress Notes (Signed)
 Heart Failure Nurse Navigator Progress Note  PCP: Thurmond Cathlyn LABOR., MD PCP-Cardiologist: Croitoru Admission Diagnosis: Acute on Chronic congestive heart failure.  Admitted from: Home  Presentation:   Kevin Patel presented with shortness of breath, worse with exertion, weight gain, 1 + BLE edema. Patient reports to both medication and diet/ fluid indiscretions. BP 137/98, HR 70, BNP 4,384, Troponin 22,25. CXR with cardiomegaly and vascular congestion and small bilateral pleural effusions.   Education on sign and symptoms of heart failure, daily weights, when to call his doctor or go to the ED.Diet/ fluid restrictions, taking all medications as prescribed and attending all medical appointments.  Patient verbalized his understanding of education. A HF TOC appointment was scheduled for 03/11/2024 @ 2 pm.   ECHO/ LVEF: 20-25%  Clinical Course:  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   Arthritis    knees   Chronic systolic HF (heart failure) (HCC)    Coronary artery disease    Diabetes mellitus with coincident hypertension (HCC) 09/09/2016   History of kidney stones    HTN (hypertension)    Hyperlipidemia    Ischemic cardiomyopathy    MI (myocardial infarction) (HCC) 1998   2018,   Mural thrombus of heart    on coumadin until follow up echo with contrast in Sept 13, 2012 which showed no thrombus and coumadin was d/c   Pre-diabetes      Social History   Socioeconomic History   Marital status: Married    Spouse name: Not on file   Number of children: Not on file   Years of education: Not on file   Highest education level: Not on file  Occupational History   Not on file  Tobacco Use   Smoking status: Former    Current packs/day: 0.00    Average packs/day: 1 pack/day for 20.0 years (20.0 ttl pk-yrs)    Types: Cigarettes    Start date: 02/12/1973    Quit date: 02/12/1993    Years since quitting: 31.0   Smokeless  tobacco: Never  Vaping Use   Vaping status: Never Used  Substance and Sexual Activity   Alcohol use: No   Drug use: No   Sexual activity: Not on file  Other Topics Concern   Not on file  Social History Narrative   Not on file   Social Drivers of Health   Financial Resource Strain: Not on file  Food Insecurity: No Food Insecurity (02/26/2024)   Hunger Vital Sign    Worried About Running Out of Food in the Last Year: Never true    Ran Out of Food in the Last Year: Never true  Transportation Needs: No Transportation Needs (02/26/2024)   PRAPARE - Administrator, Civil Service (Medical): No    Lack of Transportation (Non-Medical): No  Physical Activity: Not on file  Stress: Not on file  Social Connections: Moderately Integrated (02/26/2024)   Social Connection and Isolation Panel    Frequency of Communication with Friends and Family: More than three times a week    Frequency of Social Gatherings with Friends and Family: Three times a week    Attends Religious Services: More than 4 times per year    Active Member of Clubs or Organizations: No    Attends Banker Meetings: Never    Marital Status: Married   Water engineer and Provision:  Detailed education and instructions provided on heart failure disease management including the following:  Signs and symptoms of Heart Failure When to call the physician Importance of daily weights Low sodium diet Fluid restriction Medication management Anticipated future follow-up appointments  Patient education given on each of the above topics.  Patient acknowledges understanding via teach back method and acceptance of all instructions.  Education Materials:  Living Better With Heart Failure Booklet, HF zone tool, & Daily Weight Tracker Tool.  Patient has scale at home: Yes Patient has pill box at home: NA    High Risk Criteria for Readmission and/or Poor Patient Outcomes: Heart failure hospital  admissions (last 6 months): 1  No Show rate: 4% Difficult social situation: No Demonstrates medication adherence: No Primary Language: English  Literacy level: Reading, writing,   Barriers of Care:   Medication compliance Diet/ fluid compliance   Considerations/Referrals:   Referral made to Heart Failure Pharmacist Stewardship: NA Referral made to Heart Failure CSW/NCM TOC: NA Referral made to Heart & Vascular TOC clinic: Yes, 03/11/2024 @ 2 pm.   Items for Follow-up on DC/TOC: Medication compliance Diet/ fluid restrictions/ daily weights Continued HF education    Stephane Haddock, BSN, RN Heart Failure Print production planner Chat Only

## 2024-02-27 NOTE — Progress Notes (Signed)
  Progress Note   Patient: Kevin Patel FMW:[id number] DOB: [birth year] DOA: 02/25/2024     2 DOS: the patient was seen and examined on 02/27/2024   Brief hospital course: Kevin Patel was admitted to the hospital with the working diagnosis of heart failure exacerbation.   78 yo male with the past medical history of coronary artery disease, heart failure, atrial fibrillation, hypertension, T2DM, and CKD who presented with 24 hrs of dyspnea, cough and orthopnea. Noted dietary indiscretion and not adherent to diuretic therapy at home.  On his initial physical examination his blood pressure was 143/109, HR 71, RR 33 and 02 saturation 93%,  Lungs with no wheezing or rhonchi, heart with S1 and S2 present and regular, abdomen with no distention and positive lower extremity edema.   Assessment and Plan: * Acute on chronic systolic CHF (congestive heart failure) (HCC) Echocardiogram with reduced LV systolic function 20 to 25%, LV internal cavity with severe dilatation, grade II diastolic dysfunction (pseudo normalization), RV systolic function preserved, LA with severe dilatation and RA with moderate dilatation,RVSP 58.6 mmHg, severe functional mitral valve regurgitation.   LV apical inferior segment dyskinetic.  Entire anterior septum akinetic  Inferior wall, apical lateral segment , mid inferoseptal segment, apical anterior segment, basal infero septal segment and apex hypokinetic.   Urine output 3,675 ml Systolic blood pressure 103 mmHg.   Patient had 2 doses of IV furosemide  today.  Continue medical therapy with carvedilol , empagliflozin , and entresto .  Patient did not tolerate spironolactone  in the past, due to breast pain, will plan for eplerenone   CAD (coronary artery disease) No chest pain, no acute coronary syndrome  Continue statin, he is on DOAC Echocardiogram with findings of past LAD infarct.    Essential hypertension Continue blood pressure control with entresto  and carvedilol .    Paroxysmal atrial fibrillation (HCC) Currently on sinus rhythm.  Continue anticoagulation with apixaban .   Chronic kidney disease, stage 3a (HCC) Volume status has improved, renal function today with serum cr at 1,37 with K at 4,3 and serum bicarbonate at 26  Na 145 and Mg 2.1   Follow up renal function and electrolytes in am.  Hold on further IV furosemide  for now.  DM type 2 with diabetic mixed hyperlipidemia (HCC) Continue glucose cover and monitoring with insulin  sliding scale Continue statin      Subjective: patient is feeling better, he is out of bed in the chair, edema and dyspnea continue to improve.   Physical Exam: Vitals:   02/27/24 0012 02/27/24 0428 02/27/24 0731 02/27/24 1039  BP: 115/76 118/84 126/79 103/73  Pulse: 63 64    Resp: 19 20 20 17   Temp: 97.9 F (36.6 C) 97.9 F (36.6 C) 97.7 F (36.5 C) 97.7 F (36.5 C)  TempSrc: Oral Oral Oral Oral  SpO2: 93% 92% 96% 95%  Weight:  105.4 kg    Height:       Neurology awake and alert ENT with mild pallor Cardiovascular with S1 and S2 present and regular with no gallops or rubs, positive systolic murmur at the apex No JVD Respiratory with rales at bases with no wheezing or rhonchi  Abdomen with no distention  Lower extremity edema trace  Data Reviewed:    Family Communication: no family at the bedside   Disposition: Status is: Inpatient Remains inpatient appropriate because: recovering heart failure   Planned Discharge Destination: Home     Author: Elidia Toribio Furnace, MD 02/27/2024 2:13 PM  For on call review [email or web address].

## 2024-02-28 ENCOUNTER — Other Ambulatory Visit (HOSPITAL_COMMUNITY): Payer: Self-pay

## 2024-02-28 DIAGNOSIS — I10 Essential (primary) hypertension: Secondary | ICD-10-CM | POA: Diagnosis not present

## 2024-02-28 DIAGNOSIS — I2585 Chronic coronary microvascular dysfunction: Secondary | ICD-10-CM | POA: Diagnosis not present

## 2024-02-28 DIAGNOSIS — I5023 Acute on chronic systolic (congestive) heart failure: Secondary | ICD-10-CM | POA: Diagnosis not present

## 2024-02-28 DIAGNOSIS — I480 Paroxysmal atrial fibrillation: Secondary | ICD-10-CM | POA: Diagnosis not present

## 2024-02-28 LAB — BASIC METABOLIC PANEL WITH GFR
Anion gap: 14 (ref 5–15)
BUN: 20 mg/dL (ref 8–23)
CO2: 25 mmol/L (ref 22–32)
Calcium: 8.7 mg/dL — ABNORMAL LOW (ref 8.9–10.3)
Chloride: 100 mmol/L (ref 98–111)
Creatinine, Ser: 1.27 mg/dL — ABNORMAL HIGH (ref 0.61–1.24)
GFR, Estimated: 58 mL/min — ABNORMAL LOW (ref >60)
Glucose, Bld: 134 mg/dL — ABNORMAL HIGH (ref 70–99)
Potassium: 3.3 mmol/L — ABNORMAL LOW (ref 3.5–5.1)
Sodium: 139 mmol/L (ref 135–145)

## 2024-02-28 LAB — MAGNESIUM: Magnesium: 1.9 mg/dL (ref 1.7–2.4)

## 2024-02-28 LAB — GLUCOSE, CAPILLARY
Glucose-Capillary: 92 mg/dL (ref 70–99)
Glucose-Capillary: 115 mg/dL — ABNORMAL HIGH (ref 70–99)

## 2024-02-28 MED ORDER — POTASSIUM CHLORIDE CRYS ER 20 MEQ PO TBCR
40.0000 meq | EXTENDED_RELEASE_TABLET | ORAL | Status: DC
Start: 2024-02-28 — End: 2024-02-28
  Administered 2024-02-28: 40 meq via ORAL
  Filled 2024-02-28: qty 2

## 2024-02-28 MED ORDER — MAGNESIUM SULFATE 2 GM/50ML IV SOLN
2.0000 g | Freq: Once | INTRAVENOUS | Status: AC
  Administered 2024-02-28: 2 g via INTRAVENOUS
  Filled 2024-02-28: qty 50

## 2024-02-28 MED ORDER — EPLERENONE 25 MG PO TABS
12.5000 mg | ORAL_TABLET | Freq: Every day | ORAL | 0 refills | Status: DC
  Filled 2024-02-28 (×2): qty 30, 60d supply, fill #0
  Filled 2024-02-28: qty 5, 10d supply, fill #0

## 2024-02-28 MED ORDER — FUROSEMIDE 20 MG PO TABS
20.0000 mg | ORAL_TABLET | Freq: Every day | ORAL | 0 refills | Status: DC
  Filled 2024-02-28: qty 30, 30d supply, fill #0

## 2024-02-28 MED ORDER — FUROSEMIDE 20 MG PO TABS: 20.0000 mg | ORAL_TABLET | Freq: Every day | ORAL | Status: DC

## 2024-02-28 NOTE — Plan of Care (Signed)
   Problem: Education: Goal: Knowledge of General Education information will improve Description: Including pain rating scale, medication(s)/side effects and non-pharmacologic comfort measures Outcome: Progressing   Problem: Clinical Measurements: Goal: Cardiovascular complication will be avoided Outcome: Progressing

## 2024-02-28 NOTE — Discharge Summary (Addendum)
 Physician Discharge Summary   Patient: Kevin Patel MRN: [id number] DOB: Jan 17, 1946  Admit date:     02/25/2024  Discharge date: 02/28/24  Discharge Physician: Elidia Sieving Dionne Rossa   PCP: Thurmond Cathlyn LABOR., MD   Recommendations at discharge:    Patient was placed on Eplerenone  (intolerance to spironolactone  due to breast tenderness) , continue guideline directed medical therapy with entresto , carvedilol  and SGLT 2 inh Change furosemide  to 20 mg daily. Follow up renal function and electrolytes in 7 days as outpatient Follow up with Dr Thurmond in 7 to 10 days.  Follow up with Cardiology as scheduled.   Discharge Diagnoses: Principal Problem:   Acute on chronic systolic CHF (congestive heart failure) (HCC) Active Problems:   CAD (coronary artery disease)   Essential hypertension   Paroxysmal atrial fibrillation (HCC)   Chronic kidney disease, stage 3a (HCC)   DM type 2 with diabetic mixed hyperlipidemia (HCC)  Resolved Problems:   * No resolved hospital problems. [facility] Course: Mr. Phaneuf was admitted to the hospital with the working diagnosis of heart failure exacerbation.   78 yo male with the past medical history of coronary artery disease, heart failure, atrial fibrillation, hypertension, T2DM, and CKD who presented with 24 hrs of dyspnea, cough and orthopnea. Noted dietary indiscretion and not adherent to diuretic therapy at home.  On his initial physical examination his blood pressure was 143/109, HR 71, RR 33 and 02 saturation 93%,  Lungs with no wheezing or rhonchi, heart with S1 and S2 present and regular, abdomen with no distention and positive lower extremity edema.   Na 143, K 3.8 Cl 108 bicarbonate 24 glucose 143, bun 12 cr 1.36  BNP 4382  High sensitive troponin 22 and 25  Wbc 6.4 hgb 14.6 plt 154  Sars covid 19 negative Influenza negative RSV negative   Chest radiograph with cardiomegaly, bilateral hilar vascular congestion, with bilateral cephalization of  the vasculature and bilateral pleural effusions. Defibrillator in place with one right ventricular lead.   EKG 78 bpm, left axis deviation, left anterior fascicular block, left bundle branch block, sinus rhythm with poor RR wave progression, positive PAC, with no significant ST segment or T wave changes.   Patient was placed on IV furosemide  for diuresis.  Echocardiogram with persistent low LV systolic function   09/20 improved volume status, plan to continue oral loop diuretic therapy at home and follow up as outpatient.   Assessment and Plan: * Acute on chronic systolic CHF (congestive heart failure) (HCC) Echocardiogram with reduced LV systolic function 20 to 25%, LV internal cavity with severe dilatation, grade II diastolic dysfunction (pseudo normalization), RV systolic function preserved, LA with severe dilatation and RA with moderate dilatation,RVSP 58.6 mmHg, severe functional mitral valve regurgitation.   LV apical inferior segment dyskinetic.  Entire anterior septum akinetic  Inferior wall, apical lateral segment , mid inferoseptal segment, apical anterior segment, basal infero septal segment and apex hypokinetic.   Patient was placed on IV furosemide , negative fluid balance was achieved, - 7,298 ml, with significant improvement in his symptoms.   Continue medical therapy with carvedilol , empagliflozin , and entresto .  Patient did not tolerate spironolactone  in the past, due to breast pain, will plan to start eplerenone   Continue loop diuretic 20 mg daily for now.   Has defibrillator in place, per old records, not candidate yet for CRT.   CAD (coronary artery disease) No chest pain, no acute coronary syndrome  Ischemic cardiomyopathy, PET scan 2024 with extensive anteroapical scar  with only mild peri infarct reversible ischemia. Old records personally reviewed, would not benefit from revascularization.   Continue statin, not on aspirin  because he is on DOAC.  Continue B  blocker.   Essential hypertension Continue blood pressure control with entresto  and carvedilol .   Paroxysmal atrial fibrillation (HCC) Currently on sinus rhythm.  Continue anticoagulation with apixaban .   History of ventricular flutter, old records personally reviewed, with documented last episode in 03/2023.  Has defibrillator in place.  Determined not candidate yet for CRT.   Chronic kidney disease, stage 3a (HCC) Hypokalemia.   Renal function with serum cr at 1,27 with K at 3,3 and serum bicarbonate at 25  Na 139 and Mg 1.9   Patient will have 40 meq Kcl po x2 doses before discharge home.  Continue furosemide , SGLT 2 inh and mineralocorticoid receptor blocker.  Follow up renal function and electrolytes as outpatient.  Follow up renal function and electrolytes in am.  Hold on further IV furosemide  for now.  DM type 2 with diabetic mixed hyperlipidemia (HCC) Patient was placed on insulin  sliding scale for glucose cover and monitoring, during his hospitalization.  Glucose remained stable.   Continue statin        Consultants: none  Procedures performed: none   Disposition: Home Diet recommendation:  Cardiac diet DISCHARGE MEDICATION: Allergies as of 02/28/2024       Reactions   Spironolactone  Other (See Comments)   Made patient's nipples sore.         Medication List     TAKE these medications    apixaban  5 MG Tabs tablet Commonly known as: ELIQUIS  Take 1 tablet (5 mg total) by mouth 2 (two) times daily.   ascorbic acid 1000 MG tablet Commonly known as: VITAMIN C Take 1,000 mg by mouth at bedtime.   atorvastatin  80 MG tablet Commonly known as: LIPITOR  Take 1 tablet by mouth once daily What changed: when to take this   carvedilol  25 MG tablet Commonly known as: COREG  TAKE 1 TABLET BY MOUTH TWICE DAILY WITH A MEAL   Cholecalciferol 25 MCG (1000 UT) tablet Take 1,000 Units by mouth daily.   Entresto  97-103 MG Generic drug:  sacubitril -valsartan  Take 1 tablet by mouth twice daily   eplerenone  25 MG tablet Commonly known as: INSPRA  Take 0.5 tablets (12.5 mg total) by mouth daily. Start taking on: February 29, 2024   furosemide  20 MG tablet Commonly known as: LASIX  Take 1 tablet (20 mg total) by mouth daily. Start taking on: February 29, 2024 What changed:  when to take this reasons to take this   Jardiance  10 MG Tabs tablet Generic drug: empagliflozin  TAKE 1 TABLET BY MOUTH ONCE DAILY BEFORE BREAKFAST What changed: when to take this   MENS 50+ MULTIVITAMIN PO Take 1 capsule by mouth at bedtime.   nitroGLYCERIN  0.4 MG SL tablet Commonly known as: NITROSTAT  Place 1 tablet (0.4 mg total) under the tongue every 5 (five) minutes as needed for chest pain.        Discharge Exam: Filed Weights   02/26/24 1326 02/27/24 0428 02/28/24 0506  Weight: 106.6 kg 105.4 kg 104.1 kg   BP 92/63 (BP Location: Right Arm)   Pulse (!) 52   Temp 98.2 F (36.8 C) (Oral)   Resp 18   Ht 6' 4 (1.93 m)   Wt 104.1 kg   SpO2 94%   BMI 27.94 kg/m   Patient is feeling better, his dyspnea and lower extremity edema have resolved. Has no  PND or orthopnea  Neurology awake and alert ENT with mild pallor Cardiovascular with S1 and S2 present and regular with no gallops or rubs, positive systolic murmur at the left lower sternal border No JVD Respiratory with no rales or wheezing, no rhonchi  Abdomen with no distention  No lower extremity edema.   Condition at discharge: stable  The results of significant diagnostics from this hospitalization (including imaging, microbiology, ancillary and laboratory) are listed below for reference.   Imaging Studies: ECHOCARDIOGRAM COMPLETE Result Date: 02/26/2024    ECHOCARDIOGRAM REPORT   Patient Name:   MATIS MONNIER Date of Exam: 02/26/2024 Medical Rec #:  [id number]     Height:       76.0 in Accession #:    [id number]    Weight:       235.0 lb Date of Birth:  January 09, 1946     BSA:          2.372 m Patient Age:    77 years      BP:           100/60 mmHg Patient Gender: M             HR:           63 bpm. Exam Location:  Inpatient Procedure: 2D Echo, Cardiac Doppler, Color Doppler and Intracardiac            Opacification Agent (Both Spectral and Color Flow Doppler were            utilized during procedure). Indications:    CHF-Acute Systolic I50.21  History:        Patient has prior history of Echocardiogram examinations, most                 recent 11/27/2022. Cardiomyopathy and CHF, Angina, Previous                 Myocardial Infarction and CAD, Defibrillator, CKD, stage 3; Risk                 Factors:Diabetes and Dyslipidemia.  Sonographer:    Thea Norlander RCS Referring Phys: [id number] VASUNDHRA RATHORE IMPRESSIONS  1. Left ventricular ejection fraction, by estimation, is 20 to 25%. The left ventricle has severely decreased function. The left ventricle demonstrates regional wall motion abnormalities (see scoring diagram/findings for description). The left ventricular internal cavity size was severely dilated. Left ventricular diastolic parameters are consistent with Grade II diastolic dysfunction (pseudonormalization).  2. Right ventricular systolic function is normal. The right ventricular size is normal. There is moderately elevated pulmonary artery systolic pressure.  3. Left atrial size was severely dilated.  4. Right atrial size was moderately dilated.  5. Severe functional mitral regurgitation. ERO 0.68 cm^2. Regurgitant volume 93 ml/sec. The mitral valve is grossly normal. Moderate mitral valve regurgitation. No evidence of mitral stenosis.  6. The aortic valve is tricuspid. Aortic valve regurgitation is not visualized. No aortic stenosis is present.  7. Aortic dilatation noted. There is mild dilatation of the ascending aorta, measuring 39 mm.  8. The inferior vena cava is dilated in size with <50% respiratory variability, suggesting right atrial pressure of 15 mmHg. FINDINGS   Left Ventricle: Left ventricular ejection fraction, by estimation, is 20 to 25%. The left ventricle has severely decreased function. The left ventricle demonstrates regional wall motion abnormalities. Definity  contrast agent was given IV to delineate the left ventricular endocardial borders. The left ventricular internal cavity size was severely dilated. There is no left ventricular  hypertrophy. Abnormal (paradoxical) septal motion, consistent with left bundle branch block. Left ventricular diastolic  parameters are consistent with Grade II diastolic dysfunction (pseudonormalization). Indeterminate filling pressures.  LV Wall Scoring: The apical inferior segment is dyskinetic. The entire anterior septum is akinetic. The inferior wall, apical lateral segment, mid inferoseptal segment, apical anterior segment, basal inferoseptal segment, and apex are hypokinetic. The anterior wall, antero-lateral wall, and posterior wall are normal. Right Ventricle: The right ventricular size is normal. No increase in right ventricular wall thickness. Right ventricular systolic function is normal. There is moderately elevated pulmonary artery systolic pressure. The tricuspid regurgitant velocity is 3.30 m/s, and with an assumed right atrial pressure of 15 mmHg, the estimated right ventricular systolic pressure is 58.6 mmHg. Left Atrium: Left atrial size was severely dilated. Right Atrium: Right atrial size was moderately dilated. Pericardium: There is no evidence of pericardial effusion. Mitral Valve: Severe functional mitral regurgitation. ERO 0.68 cm^2. Regurgitant volume 93 ml/sec. The mitral valve is grossly normal. Moderate mitral valve regurgitation. No evidence of mitral valve stenosis. Tricuspid Valve: The tricuspid valve is normal in structure. Tricuspid valve regurgitation is mild . No evidence of tricuspid stenosis. Aortic Valve: The aortic valve is tricuspid. Aortic valve regurgitation is not visualized. No aortic  stenosis is present. Aortic valve peak gradient measures 4.0 mmHg. Pulmonic Valve: The pulmonic valve was normal in structure. Pulmonic valve regurgitation is trivial. No evidence of pulmonic stenosis. Aorta: Aortic dilatation noted. There is mild dilatation of the ascending aorta, measuring 39 mm. Venous: The inferior vena cava is dilated in size with less than 50% respiratory variability, suggesting right atrial pressure of 15 mmHg. IAS/Shunts: No atrial level shunt detected by color flow Doppler.  LEFT VENTRICLE PLAX 2D LVIDd:         6.80 cm   Diastology LVIDs:         6.40 cm   LV e' medial:    6.00 cm/s LV PW:         1.30 cm   LV E/e' medial:  14.7 LV IVS:        1.00 cm   LV e' lateral:   9.30 cm/s LVOT diam:     2.10 cm   LV E/e' lateral: 9.5 LV SV:         39 LV SV Index:   17 LVOT Area:     3.46 cm  RIGHT VENTRICLE             IVC RV S prime:     13.50 cm/s  IVC diam: 3.00 cm TAPSE (M-mode): 2.4 cm LEFT ATRIUM            Index        RIGHT ATRIUM           Index LA diam:      3.90 cm  1.64 cm/m   RA Area:     28.35 cm LA Vol (A2C): 100.0 ml 42.15 ml/m  RA Volume:   95.50 ml  40.25 ml/m LA Vol (A4C): 103.0 ml 43.42 ml/m  AORTIC VALVE AV Area (Vmax): 2.25 cm AV Vmax:        99.90 cm/s AV Peak Grad:   4.0 mmHg LVOT Vmax:      65.00 cm/s LVOT Vmean:     39.700 cm/s LVOT VTI:       0.114 m  AORTA Ao Root diam: 4.10 cm Ao Asc diam:  3.90 cm MITRAL VALVE  TRICUSPID VALVE MV Area (PHT): 3.91 cm       TR Peak grad:   43.6 mmHg MV Area VTI:   0.29 cm       TR Vmax:        330.00 cm/s MV VTI:        1.37 m MV Decel Time: 194 msec       SHUNTS MR Peak grad:    75.7 mmHg    Systemic VTI:  0.11 m MR Vmax:         435.00 cm/s  Systemic Diam: 2.10 cm MR PISA Nyquist: -0.3 m/s MR PISA:         9.71 cm MR PISA Radius:  1.24 cm MV E velocity: 88.00 cm/s MV A velocity: 26.20 cm/s MV E/A ratio:  3.36 Annabella Scarce MD Electronically signed by Annabella Scarce MD Signature Date/Time:  02/26/2024/9:02:11 PM    Final    DG Chest 2 View Result Date: 02/25/2024 CLINICAL DATA:  Shortness of breath. EXAM: CHEST - 2 VIEW COMPARISON:  1524 FINDINGS: Chronic cardiomegaly. Left-sided pacemaker in place. Vascular congestion. There are small bilateral pleural effusions and fluid in the fissures. No confluent opacities. No pneumothorax. No acute osseous finding. IMPRESSION: Cardiomegaly with vascular congestion and small bilateral pleural effusions. Electronically Signed   By: Andrea Gasman M.D.   On: 02/25/2024 18:48    Microbiology: Results for orders placed or performed during the hospital encounter of 02/25/24  Resp panel by RT-PCR (RSV, Flu A&B, Covid) Anterior Nasal Swab     Status: None   Collection Time: 02/25/24  8:40 PM   Specimen: Anterior Nasal Swab  Result Value Ref Range Status   SARS Coronavirus 2 by RT PCR NEGATIVE NEGATIVE Final   Influenza A by PCR NEGATIVE NEGATIVE Final   Influenza B by PCR NEGATIVE NEGATIVE Final    Comment: (NOTE) The Xpert Xpress SARS-CoV-2/FLU/RSV plus assay is intended as an aid in the diagnosis of influenza from Nasopharyngeal swab specimens and should not be used as a sole basis for treatment. Nasal washings and aspirates are unacceptable for Xpert Xpress SARS-CoV-2/FLU/RSV testing.  Fact Sheet for Patients: BloggerCourse.com  Fact Sheet for Healthcare Providers: SeriousBroker.it  This test is not yet approved or cleared by the [place]  FDA and has been authorized for detection and/or diagnosis of SARS-CoV-2 by FDA under an Emergency Use Authorization (EUA). This EUA will remain in effect (meaning this test can be used) for the duration of the COVID-19 declaration under Section 564(b)(1) of the Act, 21 U.S.C. section 360bbb-3(b)(1), unless the authorization is terminated or revoked.     Resp Syncytial Virus by PCR NEGATIVE NEGATIVE Final    Comment: (NOTE) Fact Sheet for  Patients: BloggerCourse.com  Fact Sheet for Healthcare Providers: SeriousBroker.it  This test is not yet approved or cleared by the [place]  FDA and has been authorized for detection and/or diagnosis of SARS-CoV-2 by FDA under an Emergency Use Authorization (EUA). This EUA will remain in effect (meaning this test can be used) for the duration of the COVID-19 declaration under Section 564(b)(1) of the Act, 21 U.S.C. section 360bbb-3(b)(1), unless the authorization is terminated or revoked.  Performed at [facility], [address] [place], [place] [address]     Labs: CBC: Recent Labs  Lab 02/25/24 1657 02/25/24 1716  WBC 6.4  --   NEUTROABS 4.1  --   HGB 14.6 15.0  HCT 44.1 44.0  MCV 102.6*  --   PLT 154  --  Basic Metabolic Panel: Recent Labs  Lab 02/25/24 1657 02/25/24 1716 02/26/24 1343 02/27/24 0315 02/28/24 0828  NA 143 144 143 145 139  K 3.8 3.7 3.5 4.3 3.3*  CL 108 106 101 101 100  CO2 24  --  29 26 25   GLUCOSE 143* 146* 166* 100* 134*  BUN 12 12 14 16 20   CREATININE 1.36* 1.40* 1.53* 1.37* 1.27*  CALCIUM  8.9  --  9.1 8.9 8.7*  MG  --   --  1.7 2.1 1.9   Liver Function Tests: No results for input(s): AST, ALT, ALKPHOS, BILITOT, PROT, ALBUMIN in the last 168 hours. CBG: Recent Labs  Lab 02/27/24 1040 02/27/24 1550 02/27/24 2108 02/28/24 0621 02/28/24 1136  GLUCAP 154* 132* 137* 92 115*    Discharge time spent: greater than 30 minutes.  Signed: Elidia Toribio Furnace, MD [facility] 02/28/2024

## 2024-02-28 NOTE — Plan of Care (Signed)
  Problem: Education: Goal: Ability to describe self-care measures that may prevent or decrease complications (Diabetes Survival Skills Education) will improve Outcome: Progressing   Problem: Fluid Volume: Goal: Ability to maintain a balanced intake and output will improve Outcome: Progressing   Problem: Coping: Goal: Ability to adjust to condition or change in health will improve Outcome: Progressing   Problem: Health Behavior/Discharge Planning: Goal: Ability to identify and utilize available resources and services will improve Outcome: Progressing   Problem: Health Behavior/Discharge Planning: Goal: Ability to manage health-related needs will improve Outcome: Progressing   Problem: Metabolic: Goal: Ability to maintain appropriate glucose levels will improve Outcome: Progressing   Problem: Cardiac: Goal: Ability to achieve and maintain adequate cardiopulmonary perfusion will improve Outcome: Progressing   Problem: Activity: Goal: Capacity to carry out activities will improve Outcome: Progressing

## 2024-03-01 ENCOUNTER — Telehealth (HOSPITAL_COMMUNITY): Payer: Self-pay | Admitting: Pharmacy Technician

## 2024-03-01 ENCOUNTER — Telehealth: Payer: Self-pay | Admitting: Cardiovascular Disease

## 2024-03-01 ENCOUNTER — Other Ambulatory Visit (HOSPITAL_COMMUNITY): Payer: Self-pay

## 2024-03-01 NOTE — Telephone Encounter (Signed)
 Pharmacy Patient Advocate Encounter   Received notification from Inpatient Request that prior authorization for Eplerenone  25MG  tablets  is required/requested.   Insurance verification completed.   The patient is insured through [employer] .   Per test claim: PA required; PA submitted to above mentioned insurance via Latent Key/confirmation #/EOC [id number] Status is pending

## 2024-03-01 NOTE — Telephone Encounter (Signed)
 Pharmacy Patient Advocate Encounter  Received notification from [employer] that Prior Authorization for Eplerenone  25MG  tablets  has been APPROVED from 03/01/2004 to 06/09/2024. Ran test claim, Copay is $0.00. This test claim was processed through [facility]- copay amounts may vary at other pharmacies due to pharmacy/plan contracts, or as the patient moves through the different stages of their insurance plan.   PA #/Case ID/Reference #: [id number]

## 2024-03-01 NOTE — Telephone Encounter (Signed)
 Pt is requesting a refill on medications that were prescribed in the hospital furosemide  and eplerenone . Would Dr. Francyne like to refill these medications? Please address

## 2024-03-01 NOTE — Telephone Encounter (Signed)
 Pt is scheduled for p/hosp f/u appt 03/10/24.  Advised refills will be ordered at that time based on how he is adjusting to medication.  He states understanding.  Pt did pick up prescriptions for these 2 medications 2 days ago.

## 2024-03-01 NOTE — Telephone Encounter (Signed)
 *  STAT* If patient is at the pharmacy, call can be transferred to refill team.   1. Which medications need to be refilled? (please list name of each medication and dose if known)   furosemide  (LASIX ) 20 MG tablet  eplerenone  (INSPRA ) 25 MG tablet (patient is requesting the 12.5 mg tablets to be prescribed)  2. Which pharmacy/location (including street and city if local pharmacy) is medication to be sent to?  [facility] - [place], [place] - [address]   3. Do they need a 30 day or 90 day supply?  90 days

## 2024-03-03 NOTE — Progress Notes (Signed)
Remote ICD Transmission.

## 2024-03-04 NOTE — Progress Notes (Signed)
 Kevin Patel is a 78 y.o.male.  Subjective:    This patient is in today to follow-up from a recent hospitalization.  He was admitted to [facility] with congestive heart failure.  He tells me he had been paying less attention to the details of his weight and had gained a significant amount of weight.  He began experiencing orthopnea and could adjust himself in his bed to be able to breathe better but it got to be too much with him feeling uncomfortable that he went on to the heart hospital.  He had previously been prescribed spironolactone  but it caused too much rest abnormality so they added eplerenone  to him.  He has been using that at a very low dosage.  He additionally was advised to take the Lasix  daily and has been doing that.  He feels much better overall.  There is no new fevers chills cough nausea or vomiting.  He does see the cardiologist in follow-up soon.  He is to have laboratory studies today.  Plan:    Systolic heart failure is chronic and him and improved from his hospitalization.  He appears stable to me.  I did refill the eplerenone  as he tells me he was only given 5 pills.  He will confirm that that is the medicine he is using.  He will continue the furosemide  and weigh himself daily.  He will watch his salt intake.  Coronary disease appears stable.  Hypertension appears stable.  Paroxysmal atrial fibrillation complicates these issues, remaining on Eliquis .  I will update a blood count as well.  I will remain available for him as needed.  He will keep routine follow-up.  Today I have evaluated and managed two or more of this patient's chronic stable/unstable health problems.  Today I have evaluated and managed this patient with a health problem(s) considered moderate risk for morbidity and requiring further testing and/or treatment.  _____________________________________      Diagnoses this Encounter 1. Chronic systolic congestive heart failure (HCC)   2. Coronary  arteriosclerosis   3. Essential hypertension   4. Paroxysmal atrial fibrillation [facility])    Chief Complaint  Patient presents with  . Hospital follow up    Patient's Medications  New Prescriptions   No medications on file  Previous Medications   ACETAMINOPHEN  (TYLENOL ) 325 MG TABLET    Take 650 mg by mouth every 4 (four) hours as needed for mild pain (1-3) or headaches.   ASCORBIC ACID (VITAMIN C) 1,000 MG TABLET    Take 1,000 mg by mouth Once Daily.   ATORVASTATIN  (LIPITOR ) 80 MG TABLET    Take 80 mg by mouth Once Daily.   CARVEDILOL  (COREG ) 25 MG TABLET    Take 25 mg by mouth 2 (two) times a day with meals.   CHOLECALCIFEROL (VITAMIN D3) 1,000 UNIT (25 MCG) TABLET    Take 1,000 Units by mouth Once Daily.   ELIQUIS  5 MG TAB    Take 5 mg by mouth 2 (two) times a day.   EMPAGLIFLOZIN  (JARDIANCE ) 10 MG TAB    Take 10 mg by mouth daily.   FUROSEMIDE  (LASIX ) 20 MG TABLET    Take 20 mg by mouth daily. Per [facility] dc summary   MULTIVITAMIN CAP    Take 1 capsule by mouth Once Daily.   NITROGLYCERIN  (NITROSTAT ) 0.4 MG SL TABLET    Place 0.4 mg under the tongue every 5 (five) minutes as needed for chest pain.   SACUBITRIL -VALSARTAN  (ENTRESTO ) 97-103 MG PER  TABLET    Take 1 tablet by mouth 2 (two) times a day.  Modified Medications   Modified Medication Previous Medication   EPLERENONE  (INSPRA ) 25 MG TABLET eplerenone  (INSPRA ) 25 mg tablet      Take 0.5 tablets (12.5 mg total) by mouth daily.    Take 25 mg by mouth daily. Take 0.5 tablet (12.5 mg) daily Per [facility] dc summary  Discontinued Medications   SPIRONOLACTONE  (ALDACTONE ) 25 MG TABLET    Take 25 mg by mouth daily.   Orders Placed This Encounter  Procedures  . CBC without Differential  . Comprehensive Metabolic Panel   Physical Exam Constitutional:      General: He is not in acute distress.    Appearance: He is not toxic-appearing or diaphoretic.     Comments: Comfortable, pleasant, appearing in no distress.   HENT:     Head:  Normocephalic and atraumatic.     Mouth/Throat:     Mouth: Mucous membranes are moist.  Eyes:     General: No scleral icterus.       Right eye: No discharge.        Left eye: No discharge.  Neck:     Vascular: No carotid bruit.  Cardiovascular:     Rate and Rhythm: Normal rate and regular rhythm.     Heart sounds: No murmur heard.    No friction rub.  Pulmonary:     Effort: No respiratory distress.     Breath sounds: Decreased breath sounds present. No wheezing, rhonchi or rales.  Abdominal:     Palpations: Abdomen is soft. There is no hepatomegaly or mass.     Tenderness: There is no abdominal tenderness. There is no rebound.  Musculoskeletal:     Cervical back: Neck supple.  Lymphadenopathy:     Cervical: No cervical adenopathy.  Skin:    General: Skin is warm and dry.     Coloration: Skin is not pale.     Findings: No rash.  Neurological:     Mental Status: He is alert and oriented to person, place, and time.     Gait: Gait normal.     _____________________ Vitals:   03/04/24 1328  BP: 130/68  Pulse: 63  Resp: 15  Temp: 98.8 F (37.1 C)  SpO2: 98%  Weight: 104 kg (230 lb)  Height: 1.93 m (6' 4)   Body mass index is 28 kg/m.  Problem List[1] Medical History[2] Surgical History[3] Family History[4] Social History   Socioeconomic History  . Marital status: Married    Spouse name: Not on file  . Number of children: Not on file  . Years of education: Not on file  . Highest education level: Not on file  Occupational History  . Not on file  Tobacco Use  . Smoking status: Former  . Smokeless tobacco: Never  Substance and Sexual Activity  . Alcohol use: No  . Drug use: No  . Sexual activity: Not on file  Other Topics Concern  . Not on file  Social History Narrative  . Not on file   Social Drivers of Health   Food Insecurity: Low Risk  (03/01/2024)   Food vital sign   . Within the past 12 months, you worried that your food would run out before  you got money to buy more: Never true   . Within the past 12 months, the food you bought just didn't last and you didn't have money to get more: Never true  Transportation Needs:  No Transportation Needs (03/01/2024)   Transportation   . In the past 12 months, has lack of reliable transportation kept you from medical appointments, meetings, work or from getting things needed for daily living? : No  Safety: Low Risk  (03/01/2024)   Safety   . How often does anyone, including family and friends, physically hurt you?: Never   . How often does anyone, including family and friends, insult or talk down to you?: Never   . How often does anyone, including family and friends, threaten you with harm?: Never   . How often does anyone, including family and friends, scream or curse at you?: Never  Living Situation: Low Risk  (03/01/2024)   Living Situation   . What is your living situation today?: I have a steady place to live   . Think about the place you live. Do you have problems with any of the following? Choose all that apply:: None/None on this list   Allergies[5]  Review of Systems  Constitutional:  Negative for chills and fever.  HENT:  Negative for congestion.   Respiratory:  Negative for cough, shortness of breath and wheezing.   Cardiovascular:  Negative for chest pain and palpitations.  Gastrointestinal:  Negative for abdominal pain, constipation, diarrhea and nausea.  Genitourinary:  Negative for difficulty urinating and dysuria.    Some components of the Review of Systems, Social History, Past Medical History, Family History, and Vital Signs were taken from the patient and documented by the medical assistant assisting the provider today.  Electronically signed by: Tobias Canda Redo, CMA 03/04/2024 1:28 PM       [1] Patient Active Problem List Diagnosis  . Coronary arteriosclerosis  . Type 2 diabetes mellitus with diabetic polyneuropathy, without long-term current use of  insulin  (HCC)  . Elevated PSA  . Hyperlipidemia  . Essential hypertension  . Osteoarthritis  . Stage 3a chronic kidney disease (HCC)  . History of MI (myocardial infarction)  . Subclinical hypothyroidism  . Recurrent UTI  . Chronic systolic congestive heart failure (HCC)  . History of COVID-19  . Anemia, chronic disease  . Paroxysmal atrial fibrillation (HCC)  [2] Past Medical History: Diagnosis Date  . Malignant neoplasm of skin   . Myocardial infarction    (CMD)   . Renal calculi   [3] Past Surgical History: Procedure Laterality Date  . OTHER SURGICAL HISTORY     Procedure: OTHER SURGICAL HISTORY (pci)  [4] Family History Problem Relation Name Age of Onset  . COPD Mother    . COPD Father    [5] No Known Allergies

## 2024-03-07 NOTE — Progress Notes (Unsigned)
 Cardiology Office Note    Date:  03/07/2024  ID:  Jerrik, Housholder [birth year], MRN [id number] PCP:  Thurmond Cathlyn LABOR., MD  Cardiologist:  Jerel Balding, MD  Electrophysiologist:  None   Chief Complaint: ***  History of Present Illness: .    CLEMON DEVAUL is a 78 y.o. male with visit-pertinent history of CAD, severe ischemic cardiomyopathy, ICD, paroxysmal atrial fibrillation on Eliquis , hypertension, hyperlipidemia, DM.  Patient previously had an LV apical thrombus however had not been seen on echo since 2012.  Patient with LHC in 07/2016 that showed severe single-vessel CAD with severe stenosis in the ostial LAD.  Patient was treated with successful PTCA/DES x 1 to the ostial LAD.  Echo in 08/2016 showed EF 2025%, moderate LVH, G1 DD, no apical thrombus present.  EF remained reduced on echo in 12/2016 and patient had an ICD placed in 02/27/2017 and 11/27/2020 patient had an episode of sustained VT flutter and received inappropriate ICD shock.  Echo in 12/2020 showed EF 25 to 30% with regional wall motion abnormalities, normal RV systolic function, mild mitral valve regurgitation.  Nuclear stress test in 12/2020 was consistent with prior multivessel myocardial infarctions.  ICD interrogation 01/2022 detected atrial fibrillation with RVR and he was started on Eliquis .  Patient was seen by Dr. Balding in 10/2022, at that time patient had recently been seen in the ED and was found to have CHF exacerbation.  He was treated 3 days of oral Lasix  with improvement in symptoms.  Patient was started on SGLT2 inhibitor and was given a prescription for as needed Lasix .  Remained on Eliquis , Entresto , carvedilol .  Follow-up echo in 11/2022 showed EF 25 to 30%, no LV thrombus, regional wall motion abnormalities, G2 DD, normal RV systolic function, mild MR, borderline dilation of the aortic root measuring 39 mm.  Cardiac PET stress on 01/08/2023 indicated no evidence of ischemia, there was evidence of infarction with a  large defect with moderate reduction in uptake present in the apical to basal anterior and anteroseptal locations that was fixed, there was abnormal wall motion the defect area consistent with infarction, consistent with abnormal perfusion in the LAD territory, small defect with mild reduction in uptake present in the apical to mid anterolateral and apex locations that was reversible, consistent with peri-infarct ischemia, defect is consistent with abnormal perfusion the LAD territory.  Last EF 23%.  Patient was continue with medical management.  Patient was last seen by Dr. Balding on 09/15/2023, he had remained stable from a cardiac standpoint.    On chart review patient presented to the emergency department on 9/18 with heart failure exacerbation, blood pressure was 143/109, heart rate 71, respiratory rate 33 and oxygen saturation 93%, noted to have positive lower extremity edema.  Patient was started on IV furosemide  for diuresis patient was started on eplerenone .  BNP 4384.2. Echocardiogram on 02/26/2024 indicated LVEF 20 to 25%, LV internal cavity severely dilated, G2 DD, RV systolic function and size was normal, moderately elevated PASP, LA was severely dilated, RA was moderately dilated, severe functional mitral regurgitation, moderate mitral valve regurgitation with no evidence of stenosis, aortic valve was tricuspid regurgitation not visualized, no aortic stenosis present, aortic dilation was noted with mild dilation of the ascending aorta measuring 39 mm.  Patient was seen by see PCP on 03/04/2024, per notes was euvolemic and overall doing well.   Chronic combined systolic and diastolic HF:  CAD:  PET scan performed 01/08/2023 showed extensive anteroapical scar with only  mild peri-infarct reversible ischemia.  Continue with medication management. Paroxysmal atrial fibrillation:  ICD/Vflutter: Patient had ICD implanted in 2018.  Had an episode of the flutter in 2022 that was appropriately  treated by defibrillator. Today he reports HLD: Last lipid profile Hypertension: Blood pressure today  Labwork independently reviewed:   ROS: .   *** denies chest pain, shortness of breath, lower extremity edema, fatigue, palpitations, melena, hematuria, hemoptysis, diaphoresis, weakness, presyncope, syncope, orthopnea, and PND.  All other systems are reviewed and otherwise negative.  Studies Reviewed: SABRA    EKG:  EKG is ordered today, personally reviewed, demonstrating ***     CV Studies: Cardiac studies reviewed are outlined and summarized above. Otherwise please see EMR for full report. Cardiac Studies & Procedures   ______________________________________________________________________________________________ CARDIAC CATHETERIZATION  CARDIAC CATHETERIZATION 09/05/2016  Conclusion  Prox RCA lesion, 30 %stenosed.  Mid RCA lesion, 30 %stenosed.  Ost 2nd Mrg to 2nd Mrg lesion, 20 %stenosed.  Ost Cx to Prox Cx lesion, 20 %stenosed.  Prox LAD lesion, 20 %stenosed.  A STENT SYNERGY DES 4X16 drug eluting stent was successfully placed, and overlaps previously placed stent.  Ost LAD lesion, 99 %stenosed.  Post intervention, there is a 0% residual stenosis.  Dist LAD lesion, 90 %stenosed.  The left ventricular ejection fraction is less than 25% by visual estimate.  There is severe left ventricular systolic dysfunction.  LV end diastolic pressure is moderately elevated.  There is no mitral valve regurgitation.  1. Severe single vessel CAD. Severe ulcerated stenosis ostial LAD 2. Successful PTCA/DES x 1 ostial LAD 3. Severe segmental LV systolic dysfunction  Recommendations: DAPT for at least one year with ASA and Brilinta . Continue Ace-inh and beta blocker. Consider Lifevest before discharge given LV systolic dysfunction. He may benefit from mild diuresis as well. I will give one dose of IV Lasix  today.  Findings Coronary Findings Diagnostic  Dominance: Right  Left  Anterior Descending Vessel is large. The lesion is ulcerative. The lesion was not previously treated. The lesion was previously treated using a bare metal stent over 2 years ago. Previously placed stent displays restenosis.  First Diagonal Branch Vessel is moderate in size.  Second Diagonal Branch Vessel is moderate in size.  Second Septal Branch Vessel is small in size.  Third Diagonal Branch Vessel is small in size.  Third Septal Branch Vessel is small in size.  Ramus Intermedius Vessel is small.  Lateral Ramus Intermedius Vessel is small in size.  Left Circumflex Vessel is large.  First Obtuse Marginal Branch Vessel is small in size.  Second Obtuse Marginal Branch Vessel is large in size.  Third Obtuse Marginal Branch Vessel is small in size.  Right Coronary Artery Vessel is large.  Right Posterior Descending Artery Vessel is moderate in size.  Intervention  Ost LAD lesion Angioplasty Lesion crossed with guidewire. Pre-stent angioplasty was performed using a BALLOON EUPHORA [id number]. A STENT SYNERGY DES 4X16 drug eluting stent was successfully placed, and overlaps previously placed stent. Stent strut is well apposed. Post-stent angioplasty was performed using a BALLOON [place] EUPHORA [id number]. The pre-interventional distal flow is decreased (TIMI 1).  The post-interventional distal flow is normal (TIMI 3). The intervention was successful . No complications occurred at this lesion. Pressure wire/FFR was not performed on the lesion . IVUS was performed on the lesion post PCI using a CATH OPTICROSS. There is a 0% residual stenosis post intervention.   STRESS TESTS  NM PET CT CARDIAC PERFUSION MULTI W/ABSOLUTE BLOODFLOW 01/08/2023  Narrative  LV perfusion is abnormal. There is no evidence of ischemia. There is evidence of infarction. Defect 1: There is a large defect with moderate reduction in uptake present in the apical to basal anterior and anteroseptal  location(s) that is fixed. There is abnormal wall motion in the defect area. Consistent with infarction. The defect is consistent with abnormal perfusion in the LAD territory. Defect 2: There is a small defect with mild reduction in uptake present in the apical to mid anterolateral and apex location(s) that is reversible. Consistent with peri-infarct ischemia. The defect is consistent with abnormal perfusion in the LAD territory.   Rest left ventricular function is abnormal. Rest global function is severely reduced. There were multiple regional abnormalities. Rest EF: 23%. Stress left ventricular function is abnormal. Stress global function is severely reduced. There were multiple regional abnormalities. Stress EF: 21%. End diastolic cavity size is moderately enlarged. End systolic cavity size is severely enlarged.   Myocardial blood flow was computed to be 0.66ml/g/min at rest and 0.81ml/g/min at stress. Global myocardial blood flow reserve was 1.23 and was abnormal.   Findings are consistent with infarction with peri-infarct ischemia. The study is high risk.   Large anterior, anteroseptal and anterolateral wall MI from apex to base. Small area of mild peri infarct ischemia involving the inferior apex and anterolateral apex. Abnormal global MBFR which is severely reduced in the infarct LAD territory at 1.15   Calcium  not commented on due to prior stent  CLINICAL DATA:  This over-read does not include interpretation of cardiac or coronary anatomy or pathology. The Cardiac PET CT interpretation by the cardiologist is attached.  COMPARISON:  Chest CTA dated Oct 23, 2022  FINDINGS: Vascular: Cardiomegaly. Trace pericardial effusion. Normal caliber thoracic aorta with mild calcified plaque. Severe coronary artery calcifications. Left chest wall single lead ICD with tip in the right ventricle. Subendocardial fat deposition seen at the left ventricular apex, consistent with prior  infarct.  Mediastinum/Nodes: Esophagus unremarkable. No pathologically enlarged lymph nodes seen in the chest.  Lungs/Pleura: Central airways are patent. Small bilateral pleural effusions, decreased in size when compared with the prior exam. Bibasilar atelectasis.  Upper Abdomen: No acute abnormality.  Musculoskeletal: No aggressive appearing osseous lesions.  IMPRESSION: 1. Atelectasis and bilateral pleural effusions. 2. Aortic Atherosclerosis (ICD10-I70.0).   Electronically Signed By: Rea Marc M.D. On: 01/08/2023 11:58   ECHOCARDIOGRAM  ECHOCARDIOGRAM COMPLETE 02/26/2024  Narrative ECHOCARDIOGRAM REPORT    Patient Name:   MICHAEAL DAVIS Date of Exam: 02/26/2024 Medical Rec #:  [id number]     Height:       76.0 in Accession #:    [id number]    Weight:       235.0 lb Date of Birth:  [birth year]    BSA:          2.372 m Patient Age:    77 years      BP:           100/60 mmHg Patient Gender: M             HR:           63 bpm. Exam Location:  Inpatient  Procedure: 2D Echo, Cardiac Doppler, Color Doppler and Intracardiac Opacification Agent (Both Spectral and Color Flow Doppler were utilized during procedure).  Indications:    CHF-Acute Systolic I50.21  History:        Patient has prior history of Echocardiogram examinations, most recent 11/27/2022. Cardiomyopathy and CHF, Angina, Previous Myocardial Infarction and CAD,  Defibrillator, CKD, stage 3; Risk Factors:Diabetes and Dyslipidemia.  Sonographer:    Thea Norlander RCS Referring Phys: [id number] VASUNDHRA RATHORE  IMPRESSIONS   1. Left ventricular ejection fraction, by estimation, is 20 to 25%. The left ventricle has severely decreased function. The left ventricle demonstrates regional wall motion abnormalities (see scoring diagram/findings for description). The left ventricular internal cavity size was severely dilated. Left ventricular diastolic parameters are consistent with Grade II diastolic  dysfunction (pseudonormalization). 2. Right ventricular systolic function is normal. The right ventricular size is normal. There is moderately elevated pulmonary artery systolic pressure. 3. Left atrial size was severely dilated. 4. Right atrial size was moderately dilated. 5. Severe functional mitral regurgitation. ERO 0.68 cm^2. Regurgitant volume 93 ml/sec. The mitral valve is grossly normal. Moderate mitral valve regurgitation. No evidence of mitral stenosis. 6. The aortic valve is tricuspid. Aortic valve regurgitation is not visualized. No aortic stenosis is present. 7. Aortic dilatation noted. There is mild dilatation of the ascending aorta, measuring 39 mm. 8. The inferior vena cava is dilated in size with <50% respiratory variability, suggesting right atrial pressure of 15 mmHg.  FINDINGS Left Ventricle: Left ventricular ejection fraction, by estimation, is 20 to 25%. The left ventricle has severely decreased function. The left ventricle demonstrates regional wall motion abnormalities. Definity  contrast agent was given IV to delineate the left ventricular endocardial borders. The left ventricular internal cavity size was severely dilated. There is no left ventricular hypertrophy. Abnormal (paradoxical) septal motion, consistent with left bundle branch block. Left ventricular diastolic parameters are consistent with Grade II diastolic dysfunction (pseudonormalization). Indeterminate filling pressures.   LV Wall Scoring: The apical inferior segment is dyskinetic. The entire anterior septum is akinetic. The inferior wall, apical lateral segment, mid inferoseptal segment, apical anterior segment, basal inferoseptal segment, and apex are hypokinetic. The anterior wall, antero-lateral wall, and posterior wall are normal.  Right Ventricle: The right ventricular size is normal. No increase in right ventricular wall thickness. Right ventricular systolic function is normal. There is moderately  elevated pulmonary artery systolic pressure. The tricuspid regurgitant velocity is 3.30 m/s, and with an assumed right atrial pressure of 15 mmHg, the estimated right ventricular systolic pressure is 58.6 mmHg.  Left Atrium: Left atrial size was severely dilated.  Right Atrium: Right atrial size was moderately dilated.  Pericardium: There is no evidence of pericardial effusion.  Mitral Valve: Severe functional mitral regurgitation. ERO 0.68 cm^2. Regurgitant volume 93 ml/sec. The mitral valve is grossly normal. Moderate mitral valve regurgitation. No evidence of mitral valve stenosis.  Tricuspid Valve: The tricuspid valve is normal in structure. Tricuspid valve regurgitation is mild . No evidence of tricuspid stenosis.  Aortic Valve: The aortic valve is tricuspid. Aortic valve regurgitation is not visualized. No aortic stenosis is present. Aortic valve peak gradient measures 4.0 mmHg.  Pulmonic Valve: The pulmonic valve was normal in structure. Pulmonic valve regurgitation is trivial. No evidence of pulmonic stenosis.  Aorta: Aortic dilatation noted. There is mild dilatation of the ascending aorta, measuring 39 mm.  Venous: The inferior vena cava is dilated in size with less than 50% respiratory variability, suggesting right atrial pressure of 15 mmHg.  IAS/Shunts: No atrial level shunt detected by color flow Doppler.   LEFT VENTRICLE PLAX 2D LVIDd:         6.80 cm   Diastology LVIDs:         6.40 cm   LV e' medial:    6.00 cm/s LV PW:  1.30 cm   LV E/e' medial:  14.7 LV IVS:        1.00 cm   LV e' lateral:   9.30 cm/s LVOT diam:     2.10 cm   LV E/e' lateral: 9.5 LV SV:         39 LV SV Index:   17 LVOT Area:     3.46 cm   RIGHT VENTRICLE             IVC RV S prime:     13.50 cm/s  IVC diam: 3.00 cm TAPSE (M-mode): 2.4 cm  LEFT ATRIUM            Index        RIGHT ATRIUM           Index LA diam:      3.90 cm  1.64 cm/m   RA Area:     28.35 cm LA Vol (A2C): 100.0  ml 42.15 ml/m  RA Volume:   95.50 ml  40.25 ml/m LA Vol (A4C): 103.0 ml 43.42 ml/m AORTIC VALVE AV Area (Vmax): 2.25 cm AV Vmax:        99.90 cm/s AV Peak Grad:   4.0 mmHg LVOT Vmax:      65.00 cm/s LVOT Vmean:     39.700 cm/s LVOT VTI:       0.114 m  AORTA Ao Root diam: 4.10 cm Ao Asc diam:  3.90 cm  MITRAL VALVE                  TRICUSPID VALVE MV Area (PHT): 3.91 cm       TR Peak grad:   43.6 mmHg MV Area VTI:   0.29 cm       TR Vmax:        330.00 cm/s MV VTI:        1.37 m MV Decel Time: 194 msec       SHUNTS MR Peak grad:    75.7 mmHg    Systemic VTI:  0.11 m MR Vmax:         435.00 cm/s  Systemic Diam: 2.10 cm MR PISA Nyquist: -0.3 m/s MR PISA:         9.71 cm MR PISA Radius:  1.24 cm MV E velocity: 88.00 cm/s MV A velocity: 26.20 cm/s MV E/A ratio:  3.36  Annabella Scarce MD Electronically signed by Annabella Scarce MD Signature Date/Time: 02/26/2024/9:02:11 PM    Final          ______________________________________________________________________________________________       Current Reported Medications:.    No outpatient medications have been marked as taking for the 03/10/24 encounter (Appointment) with Shanece Cochrane D, NP.    Physical Exam:    VS:  There were no vitals taken for this visit.   Wt Readings from Last 3 Encounters:  02/28/24 229 lb 8 oz (104.1 kg)  09/15/23 242 lb 9.6 oz (110 kg)  02/12/23 245 lb 9.6 oz (111.4 kg)    GEN: Well nourished, well developed in no acute distress NECK: No JVD; No carotid bruits CARDIAC: ***RRR, no murmurs, rubs, gallops RESPIRATORY:  Clear to auscultation without rales, wheezing or rhonchi  ABDOMEN: Soft, non-tender, non-distended EXTREMITIES:  No edema; No acute deformity     Asessement and Plan:.     ***     Disposition: F/u with ***  Signed, Chevette Fee D Kaleena Corrow, NP

## 2024-03-10 ENCOUNTER — Ambulatory Visit: Attending: Internal Medicine | Admitting: Cardiology

## 2024-03-10 ENCOUNTER — Encounter: Payer: Self-pay | Admitting: Cardiology

## 2024-03-10 ENCOUNTER — Telehealth (HOSPITAL_COMMUNITY): Payer: Self-pay

## 2024-03-10 VITALS — BP 126/70 | HR 60 | Ht 76.0 in | Wt 229.0 lb

## 2024-03-10 DIAGNOSIS — I255 Ischemic cardiomyopathy: Secondary | ICD-10-CM | POA: Diagnosis not present

## 2024-03-10 DIAGNOSIS — I2510 Atherosclerotic heart disease of native coronary artery without angina pectoris: Secondary | ICD-10-CM | POA: Insufficient documentation

## 2024-03-10 DIAGNOSIS — I480 Paroxysmal atrial fibrillation: Secondary | ICD-10-CM | POA: Insufficient documentation

## 2024-03-10 DIAGNOSIS — I4902 Ventricular flutter: Secondary | ICD-10-CM | POA: Insufficient documentation

## 2024-03-10 DIAGNOSIS — I10 Essential (primary) hypertension: Secondary | ICD-10-CM | POA: Diagnosis present

## 2024-03-10 DIAGNOSIS — E7800 Pure hypercholesterolemia, unspecified: Secondary | ICD-10-CM | POA: Insufficient documentation

## 2024-03-10 DIAGNOSIS — Z95810 Presence of automatic (implantable) cardiac defibrillator: Secondary | ICD-10-CM | POA: Diagnosis present

## 2024-03-10 DIAGNOSIS — I5022 Chronic systolic (congestive) heart failure: Secondary | ICD-10-CM | POA: Insufficient documentation

## 2024-03-10 MED ORDER — FUROSEMIDE 20 MG PO TABS: 20.0000 mg | ORAL_TABLET | Freq: Every day | ORAL | 3 refills | Status: AC

## 2024-03-10 NOTE — Progress Notes (Signed)
 No, I think that is exactly what we should do.  Thank you for seeing him.

## 2024-03-10 NOTE — Progress Notes (Signed)
 HEART & VASCULAR TRANSITION OF CARE CONSULT NOTE    Referring Physician: Dr. Noralee PCP: Thurmond Cathlyn LABOR., MD  Cardiologist: Jerel Balding, MD   Chief Complaint: chronic HFrEF  HPI: Referred to clinic by Dr. Arrien for heart failure consultation.   DA AUTHEMENT is a 78 y.o. 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   He was admitted 9/25 with A/C HFrEF. Diuresed well with IV lasix . 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 GDMT started.   He saw Dr. Tyrone office yesterday and plan is for limited echo to evaluate degree of MR.   Today he presents for AHF [facility] clinic visit. Overall feeling good. Denies palpitations, CP, dizziness, edema, or PND/Orthopnea. No SOB. Appetite good, tries to avoid salty foods. Does not monitor how much fluid he drinks. Weight at home 226-227 pounds. Taking all medications. Denies ETOH, tobacco or drug use. Able to go bowling on Mondays and Tuesdays. Quit smoking around 2000. Able to mow the yard at home.   MDT device interrogation: activity 1 hr/day, VS 100%, No AF, Optivol low, thoracic impedance at threshold.   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  Nuclear stress test 7/22 consistent with prior multivessel myocardial infarctions.  LHC 2/18: severe single-vessel CAD with severe stenosis in the ostial LAD. S/p successful PTCA/DES x 1 to the ostial LAD   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   Arthritis    knees   Chronic systolic HF  (heart failure) (HCC)    Coronary artery disease    Diabetes mellitus with coincident hypertension (HCC) 09/09/2016   History of kidney stones    HTN (hypertension)    Hyperlipidemia    Ischemic cardiomyopathy    MI (myocardial infarction) (HCC) 1998   2018,   Mural thrombus of heart    on coumadin until follow up echo with contrast in Sept 13, 2012 which showed no thrombus and coumadin was d/c   Pre-diabetes     Current Outpatient Medications  Medication Sig Dispense Refill   apixaban  (ELIQUIS ) 5 MG TABS tablet Take 1 tablet (5 mg total) by mouth 2 (two) times daily. 180 tablet 1   ascorbic acid (VITAMIN C) 1000 MG tablet Take 1,000 mg by mouth at bedtime.     atorvastatin  (LIPITOR ) 80 MG tablet Take 1 tablet by mouth once daily (Patient taking differently: Take 80 mg by mouth at bedtime.) 90 tablet 3   carvedilol  (COREG ) 25 MG tablet TAKE 1 TABLET BY MOUTH TWICE DAILY WITH A MEAL 180 tablet 3   Cholecalciferol 25 MCG (1000 UT) tablet Take 1,000 Units by mouth daily.     empagliflozin  (JARDIANCE ) 10 MG TABS tablet TAKE 1 TABLET BY MOUTH ONCE DAILY BEFORE BREAKFAST (Patient taking differently: Take 10 mg by mouth daily.) 90 tablet 1   eplerenone  (INSPRA ) 25 MG tablet Take 0.5 tablets (12.5 mg total) by mouth daily. 30 tablet 0   furosemide  (LASIX ) 20 MG tablet Take 1 tablet (20 mg total)  by mouth daily. 90 tablet 3   Multiple Vitamins-Minerals (MENS 50+ MULTIVITAMIN PO) Take 1 capsule by mouth at bedtime.     nitroGLYCERIN  (NITROSTAT ) 0.4 MG SL tablet Place 1 tablet (0.4 mg total) under the tongue every 5 (five) minutes as needed for chest pain. 30 tablet 1   sacubitril -valsartan  (ENTRESTO ) 97-103 MG Take 1 tablet by mouth twice daily 180 tablet 3   No current facility-administered medications for this visit.    Allergies  Allergen Reactions   Spironolactone  Other (See Comments)    Made patient's nipples sore.       Social History   Socioeconomic History   Marital status:  Married    Spouse name: Not on file   Number of children: Not on file   Years of education: Not on file   Highest education level: Not on file  Occupational History   Not on file  Tobacco Use   Smoking status: Former    Current packs/day: 0.00    Average packs/day: 1 pack/day for 20.0 years (20.0 ttl pk-yrs)    Types: Cigarettes    Start date: 02/12/1973    Quit date: 02/12/1993    Years since quitting: 31.0   Smokeless tobacco: Never  Vaping Use   Vaping status: Never Used  Substance and Sexual Activity   Alcohol use: No   Drug use: No   Sexual activity: Not on file  Other Topics Concern   Not on file  Social History Narrative   Not on file   Social Drivers of Health   Financial Resource Strain: Not on file  Food Insecurity: Low Risk  (03/01/2024)   Received from [facility]   Hunger Vital Sign    Within the past 12 months, you worried that your food would run out before you got money to buy more: Never true    Within the past 12 months, the food you bought just didn't last and you didn't have money to get more. : Never true  Transportation Needs: No Transportation Needs (03/01/2024)   Received from [employer]    In the past 12 months, has lack of reliable transportation kept you from medical appointments, meetings, work or from getting things needed for daily living? : No  Physical Activity: Not on file  Stress: Not on file  Social Connections: Moderately Integrated (02/26/2024)   Social Connection and Isolation Panel    Frequency of Communication with Friends and Family: More than three times a week    Frequency of Social Gatherings with Friends and Family: Three times a week    Attends Religious Services: More than 4 times per year    Active Member of Clubs or Organizations: No    Attends Banker Meetings: Never    Marital Status: Married  Catering manager Violence: Not At Risk (02/26/2024)   Humiliation, Afraid, Rape, and Kick  questionnaire    Fear of Current or Ex-Partner: No    Emotionally Abused: No    Physically Abused: No    Sexually Abused: No      Family History  Problem Relation Age of Onset   Emphysema Father     There were no vitals filed for this visit.  PHYSICAL EXAM: General:  elderly appearing.  No respiratory difficulty. Walked into clinic.  Neck: JVD ~6 cm.  Cor: Regular rate & rhythm. No murmurs. Lungs: clear Extremities: no edema  Neuro: alert & oriented x 3. Affect pleasant.   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 Appears euvolemic on exam.  BB- Continue coreg  25 mg BID Ace/ARB/ARNI Continue Entresto  97/103 mg BID MRA Increase eplerenone  12.5>25 mg daily, struggles to cut the small pill. He had breast tenderness with spiro.  SGLT2i Continue Jardiance  10 mg daily. Denies GU symptoms.  - BMET/BNP today. Repeat BMET in ~10 days - Not a candidate for advanced therapies (LVAD, heart transplant) with advanced age.   CAD - PET scan 7/24 with extensive anteroapical scar with only mild peri-infarct reversible ischemia  - Continue Eliquis , statin and BB - Last LDL at goal <55. 8/25 - No chest pain.   HTN - BP stable - Continue current medication regimen.   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     Referred to HFSW (PCP, Medications, Transportation, ETOH Abuse, Drug Abuse, Insurance, Financial ):  No Refer to Pharmacy: No Refer to Home Health: No Refer to Advanced Heart Failure Clinic: No  Refer to General Cardiology: Patient of Dr. Francyne  Follow up as  scheduled with Dr. Francyne

## 2024-03-10 NOTE — Patient Instructions (Signed)
 Medication Instructions:  Your physician recommends that you continue on your current medications as directed. Please refer to the Current Medication list given to you today.  *If you need a refill on your cardiac medications before your next appointment, please call your pharmacy*   Follow-Up: At [facility], you and your health needs are our priority.  As part of our continuing mission to provide you with exceptional heart care, our providers are all part of one team.  This team includes your primary Cardiologist (physician) and Advanced Practice Providers or APPs (Physician Assistants and Nurse Practitioners) who all work together to provide you with the care you need, when you need it.  Your next appointment:   3 month(s)  Provider:   Jerel Balding, MD

## 2024-03-10 NOTE — Telephone Encounter (Signed)
 Called to confirm/remind patient of their appointment at the Advanced Heart Failure Clinic on 03/11/24 2:00.   Appointment:   [] Confirmed  [x] Left mess   [] No answer/No voice mail  [] VM Full/unable to leave message  [] Phone not in service  Patient reminded to bring all medications and/or complete list.  Confirmed patient has transportation. Gave directions, instructed to utilize valet parking.

## 2024-03-11 ENCOUNTER — Encounter (HOSPITAL_COMMUNITY): Payer: Self-pay

## 2024-03-11 ENCOUNTER — Telehealth: Payer: Self-pay | Admitting: Cardiology

## 2024-03-11 ENCOUNTER — Ambulatory Visit (HOSPITAL_COMMUNITY): Payer: Self-pay | Admitting: Internal Medicine

## 2024-03-11 ENCOUNTER — Ambulatory Visit (HOSPITAL_COMMUNITY)
Admission: RE | Admit: 2024-03-11 | Discharge: 2024-03-11 | Disposition: A | Discharge status: Home / Self Care | Source: Ambulatory Visit | Attending: Internal Medicine | Admitting: Internal Medicine

## 2024-03-11 ENCOUNTER — Other Ambulatory Visit (HOSPITAL_COMMUNITY): Payer: Self-pay | Admitting: *Deleted

## 2024-03-11 VITALS — BP 122/70 | HR 57 | Ht 76.0 in | Wt 230.0 lb

## 2024-03-11 DIAGNOSIS — I110 Hypertensive heart disease with heart failure: Secondary | ICD-10-CM | POA: Diagnosis present

## 2024-03-11 DIAGNOSIS — I2510 Atherosclerotic heart disease of native coronary artery without angina pectoris: Secondary | ICD-10-CM | POA: Diagnosis not present

## 2024-03-11 DIAGNOSIS — I252 Old myocardial infarction: Secondary | ICD-10-CM | POA: Diagnosis not present

## 2024-03-11 DIAGNOSIS — Z87442 Personal history of urinary calculi: Secondary | ICD-10-CM | POA: Diagnosis not present

## 2024-03-11 DIAGNOSIS — E119 Type 2 diabetes mellitus without complications: Secondary | ICD-10-CM | POA: Insufficient documentation

## 2024-03-11 DIAGNOSIS — Z79899 Other long term (current) drug therapy: Secondary | ICD-10-CM | POA: Diagnosis not present

## 2024-03-11 DIAGNOSIS — Z87891 Personal history of nicotine dependence: Secondary | ICD-10-CM | POA: Diagnosis not present

## 2024-03-11 DIAGNOSIS — I5022 Chronic systolic (congestive) heart failure: Secondary | ICD-10-CM

## 2024-03-11 DIAGNOSIS — Z7901 Long term (current) use of anticoagulants: Secondary | ICD-10-CM | POA: Diagnosis not present

## 2024-03-11 DIAGNOSIS — Z7984 Long term (current) use of oral hypoglycemic drugs: Secondary | ICD-10-CM | POA: Diagnosis not present

## 2024-03-11 DIAGNOSIS — I480 Paroxysmal atrial fibrillation: Secondary | ICD-10-CM | POA: Insufficient documentation

## 2024-03-11 DIAGNOSIS — I10 Essential (primary) hypertension: Secondary | ICD-10-CM

## 2024-03-11 DIAGNOSIS — E785 Hyperlipidemia, unspecified: Secondary | ICD-10-CM | POA: Diagnosis not present

## 2024-03-11 DIAGNOSIS — I340 Nonrheumatic mitral (valve) insufficiency: Secondary | ICD-10-CM

## 2024-03-11 DIAGNOSIS — Z95810 Presence of automatic (implantable) cardiac defibrillator: Secondary | ICD-10-CM | POA: Insufficient documentation

## 2024-03-11 LAB — BASIC METABOLIC PANEL WITH GFR
Anion gap: 8 (ref 5–15)
BUN: 14 mg/dL (ref 8–23)
CO2: 25 mmol/L (ref 22–32)
Calcium: 8.8 mg/dL — ABNORMAL LOW (ref 8.9–10.3)
Chloride: 104 mmol/L (ref 98–111)
Creatinine, Ser: 1.21 mg/dL (ref 0.61–1.24)
GFR, Estimated: >60 mL/min (ref >60)
Glucose, Bld: 124 mg/dL — ABNORMAL HIGH (ref 70–99)
Potassium: 4.2 mmol/L (ref 3.5–5.1)
Sodium: 137 mmol/L (ref 135–145)

## 2024-03-11 LAB — BRAIN NATRIURETIC PEPTIDE: B Natriuretic Peptide: 1405.6 pg/mL — ABNORMAL HIGH (ref 0.0–100.0)

## 2024-03-11 MED ORDER — EMPAGLIFLOZIN 10 MG PO TABS: 10.0000 mg | ORAL_TABLET | Freq: Every day | ORAL | Status: DC

## 2024-03-11 MED ORDER — EPLERENONE 25 MG PO TABS: 25.0000 mg | ORAL_TABLET | Freq: Every day | ORAL | 1 refills | Status: DC

## 2024-03-11 NOTE — Telephone Encounter (Signed)
 Hi Crystal, Please call Mr. Linthicum and let him know I spoke with Dr. JAYSON, he agrees we should check a limited echocardiogram for monitoring of mitral valve regurgitation, can you please give him a call and put the order in? Thank you! Brycen Bean, NP

## 2024-03-11 NOTE — Telephone Encounter (Signed)
 Spoke to patient and let him know that Katlyn West discussed everything with Dr. JAYSON and they agreed that a limited echocardiogram should be done for monitoring of mitral valve regurgitation. Patient understood and I let him know that the order for the limited echocardiogram has been put in and someone from the office will give him a call to get that scheduled within the next 6 to 8 weeks. Patient can not do echo on Tuesdays FYI!!!!!

## 2024-03-11 NOTE — Patient Instructions (Signed)
 Medication Changes:  INCREASE Eplerenone  to 25 mg (1 tab) Daily  Lab Work:  Labs done today, your results will be available in MyChart, we will contact you for abnormal readings.  Your physician recommends that you return for lab work in: 1-2 weeks, we have provided you a prescription to have this done locally at [facility]  Special Instructions // Education:  Do the following things EVERYDAY: Weigh yourself in the morning before breakfast. Write it down and keep it in a log. Take your medicines as prescribed Eat low salt foods--Limit salt (sodium) to 2000 mg per day.  Stay as active as you can everyday Limit all fluids for the day to less than 2 liters   Follow-Up in: Thank you for allowing us  to provider your heart failure care after your recent hospitalization. Please follow-up with Dr Tyrone office

## 2024-03-22 ENCOUNTER — Encounter: Payer: Self-pay | Admitting: Cardiology

## 2024-03-27 LAB — BASIC METABOLIC PANEL WITH GFR
BUN/Creatinine Ratio: 14 (ref 10–24)
BUN: 16 mg/dL (ref 8–27)
CO2: 24 mmol/L (ref 20–29)
Calcium: 9.2 mg/dL (ref 8.6–10.2)
Chloride: 105 mmol/L (ref 96–106)
Creatinine, Ser: 1.12 mg/dL (ref 0.76–1.27)
Glucose: 109 mg/dL — ABNORMAL HIGH (ref 70–99)
Potassium: 4.5 mmol/L (ref 3.5–5.2)
Sodium: 141 mmol/L (ref 134–144)
eGFR: 67 mL/min/1.73 (ref >59)

## 2024-03-29 ENCOUNTER — Ambulatory Visit: Attending: Cardiovascular Disease

## 2024-03-29 DIAGNOSIS — Z95810 Presence of automatic (implantable) cardiac defibrillator: Secondary | ICD-10-CM

## 2024-03-29 DIAGNOSIS — I5022 Chronic systolic (congestive) heart failure: Secondary | ICD-10-CM | POA: Diagnosis not present

## 2024-03-31 ENCOUNTER — Encounter (HOSPITAL_COMMUNITY): Payer: Self-pay

## 2024-03-31 NOTE — Progress Notes (Unsigned)
 Received lab work  Potassium 4.5 Bun/Creat 16/1.12

## 2024-04-01 ENCOUNTER — Other Ambulatory Visit (HOSPITAL_COMMUNITY): Payer: Self-pay | Admitting: Internal Medicine

## 2024-04-02 ENCOUNTER — Telehealth: Payer: Self-pay

## 2024-04-02 NOTE — Telephone Encounter (Signed)
 Remote ICM transmission received.  Attempted call to patient regarding ICM remote transmission and no answer.

## 2024-04-02 NOTE — Progress Notes (Signed)
 EPIC Encounter for ICM Monitoring  Patient Name: Kevin Patel is a 78 y.o. male Date: 04/02/2024 Primary Care Physican: Thurmond Cathlyn LABOR., MD Primary Cardiologist: Croitoru Electrophysiologist: Croitoru 11/12/2022 Weight: 249 lbs 03/03/2023 Weight: 234 lbs 06/05/2023 Weight: 230 lbs 09/15/2023 Office Weight: 242 lbs    01/14/2024 Office Weight: 250 lbs     02/20/2024 Weight: 240 lbs                                           Attempted call to patient and unable to reach.  Transmission results reviewed.   Hospitalization 02/25/2024-02/28/2024 for CHF.   Since 02/16/2024 ICM Remote Transmission: Optivol thoracic impedance suggesting normal fluid levels since 02/28/2024 hospital discharge.   Prescribed:  Furosemide  20 mg take 1 tablet by mouth daily.   Labs: 03/26/2024 Creatinine 1.12, BUN 16, Potassium 4.5, Sodium 141, GFR 67 03/11/2024 Creatinine 1.21, BUN 14, Potassium 4.2, Sodium 137 03/04/2024 Creatinine 1.27, BUN 19, Potassium 4.3, Sodium 140, GFR 58 02/28/2024 Creatinine 1.27, BUN 20, Potassium 4.3, Sodium 145 02/27/2024 Creatinine 1.37, BUN 16, Potassium 4.3, Sodium 145 02/26/2024 Creatinine 1.53, BUN 14, Potassium 3.5, Sodium 143 A complete set of results can be found in Results Review.   Recommendations:  Unable to reach.     Follow-up plan: Next ICM remote phone appointment 05/03/2024.    91 day device clinic remote transmission 04/28/2024.     EP/Cardiology Office Visits:  Recall 09/14/2024 with Dr Francyne.   Copy of ICM check sent to Dr. Francyne.    Remote monitoring is medically necessary for Heart Failure Management.    Daily Thoracic Impedance ICM trend: 12/29/2023 through 03/29/2024.    12-14 Month Thoracic Impedance ICM trend:   vvv  [name], RN 04/02/2024 8:27 AM

## 2024-04-05 ENCOUNTER — Other Ambulatory Visit: Payer: Self-pay | Admitting: Cardiology

## 2024-04-05 ENCOUNTER — Ambulatory Visit (HOSPITAL_COMMUNITY)
Admission: RE | Admit: 2024-04-05 | Discharge: 2024-04-05 | Disposition: A | Discharge status: Home / Self Care | Source: Ambulatory Visit | Attending: Cardiology | Admitting: Cardiology

## 2024-04-05 DIAGNOSIS — I340 Nonrheumatic mitral (valve) insufficiency: Secondary | ICD-10-CM | POA: Diagnosis not present

## 2024-04-05 LAB — ECHOCARDIOGRAM COMPLETE
AR max vel: 2.73 cm2
AV Area VTI: 2.5 cm2
AV Area mean vel: 2.42 cm2
AV Mean grad: 2 mmHg
AV Peak grad: 3.7 mmHg
Ao pk vel: 0.96 m/s
Area-P 1/2: 1.45 cm2
Est EF: 20
MV M vel: 4.4 m/s
MV Peak grad: 77.4 mmHg
Radius: 0.5 cm
S' Lateral: 5.8 cm

## 2024-04-05 MED ORDER — PERFLUTREN LIPID MICROSPHERE
1.0000 mL | INTRAVENOUS | Status: AC | PRN
  Administered 2024-04-05: 5 mL via INTRAVENOUS

## 2024-04-07 ENCOUNTER — Ambulatory Visit: Payer: Self-pay | Admitting: Cardiology

## 2024-04-10 ENCOUNTER — Other Ambulatory Visit: Payer: Self-pay | Admitting: Cardiovascular Disease

## 2024-04-10 DIAGNOSIS — I2510 Atherosclerotic heart disease of native coronary artery without angina pectoris: Secondary | ICD-10-CM

## 2024-04-10 DIAGNOSIS — I5022 Chronic systolic (congestive) heart failure: Secondary | ICD-10-CM

## 2024-04-28 ENCOUNTER — Ambulatory Visit: Payer: Medicare Other

## 2024-04-28 DIAGNOSIS — I5022 Chronic systolic (congestive) heart failure: Secondary | ICD-10-CM | POA: Diagnosis not present

## 2024-04-29 LAB — CUP PACEART REMOTE DEVICE CHECK
Battery Remaining Longevity: 45 mo
Battery Voltage: 2.97 V
Brady Statistic RV Percent Paced: 0.01 %
Date Time Interrogation Session: 20251119012303
HighPow Impedance: 58 Ohm
Implantable Lead Connection Status: 753985
Implantable Lead Implant Date: 20180905
Implantable Lead Location: 753860
Implantable Pulse Generator Implant Date: 20180905
Lead Channel Impedance Value: 342 Ohm
Lead Channel Impedance Value: 456 Ohm
Lead Channel Pacing Threshold Amplitude: 0.625 V
Lead Channel Pacing Threshold Pulse Width: 0.4 ms
Lead Channel Sensing Intrinsic Amplitude: 11 mV
Lead Channel Sensing Intrinsic Amplitude: 11 mV
Lead Channel Setting Pacing Amplitude: 2.5 V
Lead Channel Setting Pacing Pulse Width: 0.4 ms
Lead Channel Setting Sensing Sensitivity: 0.3 mV
Zone Setting Status: 755011
Zone Setting Status: 755011

## 2024-04-30 NOTE — Progress Notes (Signed)
 Remote ICD Transmission

## 2024-05-03 ENCOUNTER — Ambulatory Visit: Payer: Self-pay | Admitting: Cardiovascular Disease

## 2024-05-03 ENCOUNTER — Ambulatory Visit: Attending: Cardiovascular Disease

## 2024-05-03 DIAGNOSIS — Z95810 Presence of automatic (implantable) cardiac defibrillator: Secondary | ICD-10-CM | POA: Diagnosis not present

## 2024-05-03 DIAGNOSIS — I5022 Chronic systolic (congestive) heart failure: Secondary | ICD-10-CM

## 2024-05-03 NOTE — Progress Notes (Signed)
 EPIC Encounter for ICM Monitoring  Patient Name: Kevin Patel is a 78 y.o. male Date: 05/03/2024 Primary Care Physican: Kevin Patel., MD Primary Cardiologist: Kevin Patel Electrophysiologist: Kevin Patel 11/12/2022 Weight: 249 lbs 03/03/2023 Weight: 234 lbs 06/05/2023 Weight: 230 lbs 09/15/2023 Office Weight: 242 lbs    01/14/2024 Office Weight: 250 lbs     02/20/2024 Weight: 240 lbs                                           Transmission results reviewed.      Since 03/29/2024 ICM Remote Transmission: Optivol thoracic impedance suggesting possible fluid accumulation starting 04/11/2024.   Prescribed:  Furosemide  20 mg take 1 tablet by mouth daily.   Labs: 03/26/2024 Creatinine 1.12, BUN 16, Potassium 4.5, Sodium 141, GFR 67 03/11/2024 Creatinine 1.21, BUN 14, Potassium 4.2, Sodium 137 03/04/2024 Creatinine 1.27, BUN 19, Potassium 4.3, Sodium 140, GFR 58 02/28/2024 Creatinine 1.27, BUN 20, Potassium 4.3, Sodium 145 02/27/2024 Creatinine 1.37, BUN 16, Potassium 4.3, Sodium 145 02/26/2024 Creatinine 1.53, BUN 14, Potassium 3.5, Sodium 143 A complete set of results can be found in Results Review.   Recommendations:  Pt received recommendations to double Lasix  for 2-3 days from Dr Kevin Patel 05/03/2024.   Also encouraged to watch sodium intake.     Follow-up plan: Next ICM remote phone appointment 05/10/2024 (manual) to recheck fluid levels.    91 day device clinic remote transmission 07/29/2023.     EP/Cardiology Office Visits:  Recall 09/14/2024 with Dr Kevin Patel.   Copy of ICM check sent to Dr. Francyne.    Remote monitoring is medically necessary for Heart Failure Management.    Daily Thoracic Impedance ICM trend: 02/02/2024 through 05/03/2024.    12-14 Month Thoracic Impedance ICM trend:     Kevin GORMAN Garner, RN 05/03/2024 5:06 PM

## 2024-05-11 ENCOUNTER — Telehealth: Payer: Self-pay

## 2024-05-11 NOTE — Telephone Encounter (Signed)
 Received call from patient.  He spoke with [facility] and will receive a new monitor hand piece in 7-10 days.    He reported weight dropped from 231 lbs to 227 lbs overnight after taking 2 lasix  tablets x 1 day.   He is feeling better and weight today, 05/11/2024 is 229.1 lbs.  He said Dr Francyne instructed him to take 2 Lasix  if he has weight gain again of 3 lbs overnight.    Advised will update Dr Francyne on results of taking extra Lasix  and unable to obtain remote transmission due to monitor hand piece needs replacing.  Advised to call if has any problems with fluid accumulation.

## 2024-05-11 NOTE — Telephone Encounter (Signed)
Thank you, Laurie.

## 2024-05-11 NOTE — Telephone Encounter (Signed)
 Spoke with patient and attempted to assist with sending manual remote transmission from home monitor.  He received error code.  Provided [employer] number and he will call to get assistance with monitor.  He reports he feels like he the fluid accumulation has resolved after Dr Francyne instructed him to take extra Lasix  for 2-3 days.

## 2024-06-07 ENCOUNTER — Ambulatory Visit: Attending: Cardiovascular Disease

## 2024-06-07 DIAGNOSIS — Z95810 Presence of automatic (implantable) cardiac defibrillator: Secondary | ICD-10-CM | POA: Diagnosis not present

## 2024-06-07 DIAGNOSIS — I5022 Chronic systolic (congestive) heart failure: Secondary | ICD-10-CM | POA: Diagnosis not present

## 2024-06-09 NOTE — Progress Notes (Signed)
 EPIC Encounter for ICM Monitoring  Patient Name: Kevin Patel is a 78 y.o. male Date: 06/09/2024 Primary Care Physican: Thurmond Cathlyn LABOR., MD Primary Cardiologist: Croitoru Electrophysiologist: Croitoru 11/12/2022 Weight: 249 lbs 03/03/2023 Weight: 234 lbs 06/05/2023 Weight: 230 lbs 09/15/2023 Office Weight: 242 lbs    01/14/2024 Office Weight: 250 lbs     02/20/2024 Weight: 240 lbs     06/09/2024 Weight: 230 lbs                                       Spoke with patient and heart failure questions reviewed.  Transmission results reviewed.  Pt asymptomatic for fluid accumulation.  Reports feeling well at this time and voices no complaints.       Since 05/03/2024 ICM Remote Transmission: Optivol thoracic impedance suggesting normal fluid levels with the exception of possible fluid accumulation from 05/22/2024-06/03/2024.   Prescribed:  Furosemide  20 mg take 1 tablet by mouth daily.   Labs: 03/26/2024 Creatinine 1.12, BUN 16, Potassium 4.5, Sodium 141, GFR 67 03/11/2024 Creatinine 1.21, BUN 14, Potassium 4.2, Sodium 137 03/04/2024 Creatinine 1.27, BUN 19, Potassium 4.3, Sodium 140, GFR 58 02/28/2024 Creatinine 1.27, BUN 20, Potassium 4.3, Sodium 145 02/27/2024 Creatinine 1.37, BUN 16, Potassium 4.3, Sodium 145 02/26/2024 Creatinine 1.53, BUN 14, Potassium 3.5, Sodium 143 A complete set of results can be found in Results Review.   Recommendations:  No changes and encouraged to call if experiencing any fluid symptoms.     Follow-up plan: Next ICM remote phone appointment 07/12/2024.    91 day device clinic remote transmission 07/29/2023.     EP/Cardiology Office Visits:  Recall 09/14/2024 with Dr Francyne.   Copy of ICM check sent to Dr. Francyne.     Remote monitoring is medically necessary for Heart Failure Management.    Daily Thoracic Impedance ICM trend: 03/08/2024 through 06/07/2024.    12-14 Month Thoracic Impedance ICM trend:     [name], RN 06/09/2024 8:57 AM

## 2024-06-11 ENCOUNTER — Encounter: Payer: Self-pay | Admitting: Cardiovascular Disease

## 2024-06-11 ENCOUNTER — Telehealth: Payer: Self-pay | Admitting: Cardiovascular Disease

## 2024-06-11 NOTE — Telephone Encounter (Signed)
" °*  STAT* If patient is at the pharmacy, call can be transferred to refill team.   1. Which medications need to be refilled? (please list name of each medication and dose if known) eplerenone  (INSPRA ) 25 MG tablet    2. Would you like to learn more about the convenience, safety, & potential cost savings by using the [facility]? No   3. Are you open to using the [facility] (Type [facility].) No   4. Which pharmacy/location (including street and city if local pharmacy) is medication to be sent to?  [facility] - [place], [place] - [address]   5. Do they need a 30 day or 90 day supply? 90 day  "

## 2024-06-11 NOTE — Telephone Encounter (Signed)
"  error  "

## 2024-06-15 MED ORDER — EPLERENONE 25 MG PO TABS: 25.0000 mg | ORAL_TABLET | Freq: Every day | ORAL | 0 refills | Status: AC

## 2024-06-15 NOTE — Telephone Encounter (Signed)
 Pt to establish with Dr JAYSON Will courtesy refill

## 2024-07-06 ENCOUNTER — Other Ambulatory Visit: Payer: Self-pay | Admitting: Cardiovascular Disease

## 2024-07-06 DIAGNOSIS — I236 Thrombosis of atrium, auricular appendage, and ventricle as current complications following acute myocardial infarction: Secondary | ICD-10-CM

## 2024-07-06 DIAGNOSIS — I480 Paroxysmal atrial fibrillation: Secondary | ICD-10-CM

## 2024-07-06 DIAGNOSIS — I255 Ischemic cardiomyopathy: Secondary | ICD-10-CM

## 2024-07-06 DIAGNOSIS — I5022 Chronic systolic (congestive) heart failure: Secondary | ICD-10-CM

## 2024-07-06 NOTE — Telephone Encounter (Signed)
 Eliquis  5mg  refill request received. Patient is 79 years old, weight-104.3kg, Crea-1.12 on 03/26/24, Diagnosis-Afib, and last seen by Katlyn West, NP on 03/10/24 and pending Dr. Francyne on 09/16/24. Dose is appropriate based on dosing criteria. Will send in refill to requested pharmacy.

## 2024-07-08 NOTE — Progress Notes (Signed)
 31 day ICM Remote transmission canceled due to [facility] clinic is on hold until further notice.  91 day remote monitoring will continue per protocol.

## 2024-07-12 ENCOUNTER — Ambulatory Visit

## 2024-09-16 ENCOUNTER — Ambulatory Visit: Admitting: Cardiovascular Disease
